# Patient Record
Sex: Female | Born: 1935 | Race: White | Hispanic: No | Marital: Married | Smoking: Never smoker
Health system: Southern US, Community
[De-identification: ages and names within clinical notes are randomized; demographics above are authoritative.]

## PROBLEM LIST (undated history)

## (undated) DIAGNOSIS — N6019 Diffuse cystic mastopathy of unspecified breast: Secondary | ICD-10-CM

## (undated) DIAGNOSIS — E079 Disorder of thyroid, unspecified: Secondary | ICD-10-CM

## (undated) DIAGNOSIS — T8859XA Other complications of anesthesia, initial encounter: Secondary | ICD-10-CM

## (undated) DIAGNOSIS — K219 Gastro-esophageal reflux disease without esophagitis: Secondary | ICD-10-CM

## (undated) DIAGNOSIS — T4145XA Adverse effect of unspecified anesthetic, initial encounter: Secondary | ICD-10-CM

## (undated) DIAGNOSIS — R112 Nausea with vomiting, unspecified: Secondary | ICD-10-CM

## (undated) DIAGNOSIS — Z9889 Other specified postprocedural states: Secondary | ICD-10-CM

## (undated) DIAGNOSIS — C50919 Malignant neoplasm of unspecified site of unspecified female breast: Secondary | ICD-10-CM

## (undated) DIAGNOSIS — Z972 Presence of dental prosthetic device (complete) (partial): Secondary | ICD-10-CM

## (undated) DIAGNOSIS — E785 Hyperlipidemia, unspecified: Secondary | ICD-10-CM

## (undated) DIAGNOSIS — L84 Corns and callosities: Secondary | ICD-10-CM

## (undated) DIAGNOSIS — Z974 Presence of external hearing-aid: Secondary | ICD-10-CM

## (undated) DIAGNOSIS — R42 Dizziness and giddiness: Secondary | ICD-10-CM

## (undated) DIAGNOSIS — M810 Age-related osteoporosis without current pathological fracture: Secondary | ICD-10-CM

## (undated) DIAGNOSIS — C801 Malignant (primary) neoplasm, unspecified: Secondary | ICD-10-CM

## (undated) DIAGNOSIS — T7840XA Allergy, unspecified, initial encounter: Secondary | ICD-10-CM

## (undated) DIAGNOSIS — Z923 Personal history of irradiation: Secondary | ICD-10-CM

## (undated) DIAGNOSIS — Z8489 Family history of other specified conditions: Secondary | ICD-10-CM

## (undated) HISTORY — PX: BREAST LUMPECTOMY: SHX2

## (undated) HISTORY — PX: APPENDECTOMY: SHX54

## (undated) HISTORY — PX: ABDOMINAL HYSTERECTOMY: SHX81

## (undated) HISTORY — PX: BREAST CYST EXCISION: SHX579

## (undated) HISTORY — DX: Corns and callosities: L84

## (undated) HISTORY — DX: Allergy, unspecified, initial encounter: T78.40XA

## (undated) HISTORY — DX: Gastro-esophageal reflux disease without esophagitis: K21.9

## (undated) HISTORY — PX: OTHER SURGICAL HISTORY: SHX169

## (undated) HISTORY — PX: CHOLECYSTECTOMY: SHX55

## (undated) HISTORY — DX: Disorder of thyroid, unspecified: E07.9

## (undated) HISTORY — DX: Hyperlipidemia, unspecified: E78.5

## (undated) HISTORY — DX: Age-related osteoporosis without current pathological fracture: M81.0

---

## 1978-02-05 HISTORY — PX: THYROID SURGERY: SHX805

## 2002-02-05 HISTORY — PX: BUNIONECTOMY: SHX129

## 2003-02-06 HISTORY — PX: KNEE SURGERY: SHX244

## 2004-07-06 ENCOUNTER — Ambulatory Visit: Payer: Self-pay

## 2005-03-26 ENCOUNTER — Ambulatory Visit: Payer: Self-pay | Admitting: Orthopedic Surgery

## 2005-04-24 ENCOUNTER — Other Ambulatory Visit: Payer: Self-pay

## 2005-04-24 ENCOUNTER — Ambulatory Visit: Payer: Self-pay | Admitting: Orthopedic Surgery

## 2005-04-30 ENCOUNTER — Ambulatory Visit: Payer: Self-pay | Admitting: Orthopedic Surgery

## 2005-09-04 ENCOUNTER — Ambulatory Visit: Payer: Self-pay | Admitting: Family Medicine

## 2005-09-11 ENCOUNTER — Ambulatory Visit: Payer: Self-pay

## 2005-09-21 ENCOUNTER — Ambulatory Visit: Payer: Self-pay

## 2005-10-09 ENCOUNTER — Ambulatory Visit: Payer: Self-pay | Admitting: Vascular Surgery

## 2005-10-19 ENCOUNTER — Inpatient Hospital Stay: Payer: Self-pay | Admitting: Vascular Surgery

## 2005-10-19 HISTORY — PX: CAROTID BODY TUMOR EXCISION: SHX5156

## 2006-12-04 ENCOUNTER — Ambulatory Visit: Payer: Self-pay | Admitting: Family Medicine

## 2007-11-12 LAB — HM PAP SMEAR: HM Pap smear: NORMAL

## 2007-12-16 ENCOUNTER — Ambulatory Visit: Payer: Self-pay | Admitting: Family Medicine

## 2008-12-22 ENCOUNTER — Ambulatory Visit: Payer: Self-pay

## 2009-04-05 ENCOUNTER — Ambulatory Visit: Payer: Self-pay | Admitting: Family Medicine

## 2010-02-28 ENCOUNTER — Ambulatory Visit: Payer: Self-pay | Admitting: Family Medicine

## 2010-06-27 ENCOUNTER — Emergency Department: Payer: Self-pay | Admitting: Unknown Physician Specialty

## 2011-02-06 DIAGNOSIS — C50911 Malignant neoplasm of unspecified site of right female breast: Secondary | ICD-10-CM

## 2011-02-06 HISTORY — PX: BREAST BIOPSY: SHX20

## 2011-02-06 HISTORY — PX: BREAST EXCISIONAL BIOPSY: SUR124

## 2011-02-06 HISTORY — DX: Malignant neoplasm of unspecified site of right female breast: C50.911

## 2011-03-07 ENCOUNTER — Ambulatory Visit: Payer: Self-pay | Admitting: Family Medicine

## 2011-03-08 ENCOUNTER — Ambulatory Visit: Payer: Self-pay | Admitting: Family Medicine

## 2011-03-22 ENCOUNTER — Ambulatory Visit: Payer: Self-pay | Admitting: Surgery

## 2011-03-30 ENCOUNTER — Ambulatory Visit: Payer: Self-pay | Admitting: Surgery

## 2011-03-30 LAB — CBC WITH DIFFERENTIAL/PLATELET
Basophil %: 0.5 %
Eosinophil %: 1.2 %
HCT: 37.8 % (ref 35.0–47.0)
HGB: 12.5 g/dL (ref 12.0–16.0)
Lymphocyte #: 2.1 10*3/uL (ref 1.0–3.6)
MCH: 30.7 pg (ref 26.0–34.0)
MCHC: 33.1 g/dL (ref 32.0–36.0)
MCV: 93 fL (ref 80–100)
Monocyte #: 0.5 10*3/uL (ref 0.0–0.7)
Monocyte %: 10.1 %
Neutrophil #: 2.3 10*3/uL (ref 1.4–6.5)
Neutrophil %: 45.8 %
RBC: 4.08 10*6/uL (ref 3.80–5.20)
WBC: 4.9 10*3/uL (ref 3.6–11.0)

## 2011-03-30 LAB — PATHOLOGY REPORT

## 2011-04-05 ENCOUNTER — Ambulatory Visit: Payer: Self-pay | Admitting: Surgery

## 2011-04-09 LAB — PATHOLOGY REPORT

## 2011-04-18 ENCOUNTER — Ambulatory Visit: Payer: Self-pay | Admitting: Internal Medicine

## 2011-05-07 ENCOUNTER — Ambulatory Visit: Payer: Self-pay | Admitting: Surgery

## 2011-05-07 ENCOUNTER — Ambulatory Visit: Payer: Self-pay | Admitting: Internal Medicine

## 2011-06-06 ENCOUNTER — Ambulatory Visit: Payer: Self-pay | Admitting: Internal Medicine

## 2011-07-07 ENCOUNTER — Ambulatory Visit: Payer: Self-pay | Admitting: Internal Medicine

## 2011-08-28 ENCOUNTER — Ambulatory Visit: Payer: Self-pay | Admitting: Internal Medicine

## 2011-08-28 LAB — CREATININE, SERUM: EGFR (Non-African Amer.): >60

## 2011-08-28 LAB — CBC CANCER CENTER
Basophil #: 0 x10 3/mm (ref 0.0–0.1)
Eosinophil #: 0.1 x10 3/mm (ref 0.0–0.7)
HCT: 38.9 % (ref 35.0–47.0)
HGB: 12.6 g/dL (ref 12.0–16.0)
Lymphocyte #: 2.1 x10 3/mm (ref 1.0–3.6)
Lymphocyte %: 35.1 %
MCH: 29.8 pg (ref 26.0–34.0)
MCHC: 32.3 g/dL (ref 32.0–36.0)
Monocyte #: 0.5 x10 3/mm (ref 0.2–0.9)
Monocyte %: 8.5 %
Neutrophil %: 54 %
Platelet: 213 x10 3/mm (ref 150–440)
RBC: 4.21 10*6/uL (ref 3.80–5.20)
WBC: 5.9 x10 3/mm (ref 3.6–11.0)

## 2011-08-28 LAB — HEPATIC FUNCTION PANEL A (ARMC)
Albumin: 4.2 g/dL (ref 3.4–5.0)
Bilirubin, Direct: 0.2 mg/dL (ref 0.00–0.20)
SGOT(AST): 20 U/L (ref 15–37)
SGPT (ALT): 24 U/L
Total Protein: 7.3 g/dL (ref 6.4–8.2)

## 2011-09-06 ENCOUNTER — Ambulatory Visit: Payer: Self-pay | Admitting: Internal Medicine

## 2011-10-11 ENCOUNTER — Ambulatory Visit: Payer: Self-pay | Admitting: Internal Medicine

## 2011-10-23 ENCOUNTER — Ambulatory Visit: Payer: Self-pay | Admitting: Internal Medicine

## 2011-10-26 LAB — CREATININE, SERUM
EGFR (African American): >60
EGFR (Non-African Amer.): 58 — ABNORMAL LOW

## 2011-10-26 LAB — CBC CANCER CENTER
Basophil %: 0.6 %
HGB: 12.9 g/dL (ref 12.0–16.0)
Lymphocyte #: 1.7 x10 3/mm (ref 1.0–3.6)
Lymphocyte %: 33.7 %
MCH: 30.6 pg (ref 26.0–34.0)
MCHC: 32.9 g/dL (ref 32.0–36.0)
MCV: 93 fL (ref 80–100)
Monocyte %: 9.3 %
Neutrophil #: 2.8 x10 3/mm (ref 1.4–6.5)
Neutrophil %: 55.4 %
RDW: 13.9 % (ref 11.5–14.5)

## 2011-10-26 LAB — HEPATIC FUNCTION PANEL A (ARMC)
Alkaline Phosphatase: 72 U/L (ref 50–136)
Bilirubin, Direct: 0.1 mg/dL (ref 0.00–0.20)
Bilirubin,Total: 0.5 mg/dL (ref 0.2–1.0)
SGOT(AST): 17 U/L (ref 15–37)
Total Protein: 7.6 g/dL (ref 6.4–8.2)

## 2011-11-06 ENCOUNTER — Ambulatory Visit: Payer: Self-pay | Admitting: Internal Medicine

## 2012-02-06 ENCOUNTER — Ambulatory Visit: Payer: Self-pay | Admitting: Internal Medicine

## 2012-03-04 LAB — CBC CANCER CENTER
Basophil %: 0.6 %
Eosinophil #: 0.1 x10 3/mm (ref 0.0–0.7)
Eosinophil %: 1.3 %
HCT: 38.5 % (ref 35.0–47.0)
HGB: 13 g/dL (ref 12.0–16.0)
Lymphocyte %: 43 %
MCH: 31.1 pg (ref 26.0–34.0)
MCHC: 33.9 g/dL (ref 32.0–36.0)
MCV: 92 fL (ref 80–100)
Monocyte %: 7.9 %
Neutrophil #: 2.7 x10 3/mm (ref 1.4–6.5)
Neutrophil %: 47.2 %
WBC: 5.7 x10 3/mm (ref 3.6–11.0)

## 2012-03-04 LAB — HEPATIC FUNCTION PANEL A (ARMC)
Albumin: 4.2 g/dL (ref 3.4–5.0)
Bilirubin,Total: 0.5 mg/dL (ref 0.2–1.0)
SGOT(AST): 22 U/L (ref 15–37)

## 2012-03-04 LAB — CREATININE, SERUM
EGFR (African American): >60
EGFR (Non-African Amer.): >60

## 2012-03-08 ENCOUNTER — Ambulatory Visit: Payer: Self-pay | Admitting: Internal Medicine

## 2012-03-31 ENCOUNTER — Ambulatory Visit: Payer: Self-pay | Admitting: Family Medicine

## 2012-03-31 LAB — HM MAMMOGRAPHY

## 2012-04-05 ENCOUNTER — Ambulatory Visit: Payer: Self-pay | Admitting: Internal Medicine

## 2012-05-06 ENCOUNTER — Ambulatory Visit: Payer: Self-pay | Admitting: Internal Medicine

## 2012-11-03 ENCOUNTER — Ambulatory Visit: Payer: Self-pay | Admitting: Internal Medicine

## 2012-11-03 LAB — CBC CANCER CENTER
Basophil #: 0 x10 3/mm (ref 0.0–0.1)
Basophil %: 0.5 %
Eosinophil #: 0.1 x10 3/mm (ref 0.0–0.7)
HGB: 13.9 g/dL (ref 12.0–16.0)
Lymphocyte #: 2.3 x10 3/mm (ref 1.0–3.6)
Lymphocyte %: 40.4 %
MCH: 30.9 pg (ref 26.0–34.0)
MCV: 92 fL (ref 80–100)
Neutrophil #: 2.7 x10 3/mm (ref 1.4–6.5)
Platelet: 226 x10 3/mm (ref 150–440)
RBC: 4.49 10*6/uL (ref 3.80–5.20)
WBC: 5.6 x10 3/mm (ref 3.6–11.0)

## 2012-11-03 LAB — CREATININE, SERUM: EGFR (African American): >60

## 2012-11-03 LAB — HEPATIC FUNCTION PANEL A (ARMC)
Albumin: 4.3 g/dL (ref 3.4–5.0)
SGPT (ALT): 33 U/L (ref 12–78)

## 2012-11-05 ENCOUNTER — Ambulatory Visit: Payer: Self-pay | Admitting: Internal Medicine

## 2013-04-15 ENCOUNTER — Ambulatory Visit: Payer: Self-pay | Admitting: Internal Medicine

## 2013-04-17 ENCOUNTER — Ambulatory Visit: Payer: Self-pay | Admitting: Internal Medicine

## 2013-05-06 ENCOUNTER — Ambulatory Visit: Payer: Self-pay | Admitting: Internal Medicine

## 2013-07-01 LAB — LIPID PANEL
Cholesterol: 204 mg/dL — AB (ref 0–200)
HDL: 59 mg/dL (ref 35–70)
LDL Cholesterol: 118 mg/dL
TRIGLYCERIDES: 137 mg/dL (ref 40–160)

## 2013-07-01 LAB — BASIC METABOLIC PANEL
BUN: 16 mg/dL (ref 4–21)
CREATININE: 0.8 mg/dL (ref 0.5–1.1)
GLUCOSE: 101 mg/dL
Potassium: 4.6 mmol/L (ref 3.4–5.3)
SODIUM: 144 mmol/L (ref 137–147)

## 2013-07-01 LAB — CBC AND DIFFERENTIAL
HCT: 39 % (ref 36–46)
HEMOGLOBIN: 13.1 g/dL (ref 12.0–16.0)
Platelets: 259 10*3/uL (ref 150–399)
WBC: 5.6 10^3/mL

## 2013-07-01 LAB — HEPATIC FUNCTION PANEL
ALT: 12 U/L (ref 7–35)
AST: 13 U/L (ref 13–35)

## 2013-07-01 LAB — TSH: TSH: 1.07 u[IU]/mL (ref 0.41–5.90)

## 2013-07-01 LAB — HEMOGLOBIN A1C: HEMOGLOBIN A1C: 6.2 % — AB (ref 4.0–6.0)

## 2013-07-13 LAB — HM DEXA SCAN

## 2013-09-23 ENCOUNTER — Ambulatory Visit: Payer: Self-pay | Admitting: Ophthalmology

## 2013-11-04 ENCOUNTER — Ambulatory Visit: Payer: Self-pay | Admitting: Internal Medicine

## 2013-11-04 LAB — CBC CANCER CENTER
Basophil #: 0 x10 3/mm (ref 0.0–0.1)
Basophil %: 0.6 %
EOS PCT: 1.8 %
Eosinophil #: 0.1 x10 3/mm (ref 0.0–0.7)
HCT: 40.1 % (ref 35.0–47.0)
HGB: 13 g/dL (ref 12.0–16.0)
LYMPHS PCT: 41.8 %
Lymphocyte #: 2.1 x10 3/mm (ref 1.0–3.6)
MCH: 30.1 pg (ref 26.0–34.0)
MCHC: 32.4 g/dL (ref 32.0–36.0)
MCV: 93 fL (ref 80–100)
MONO ABS: 0.4 x10 3/mm (ref 0.2–0.9)
MONOS PCT: 8.4 %
NEUTROS ABS: 2.4 x10 3/mm (ref 1.4–6.5)
NEUTROS PCT: 47.4 %
Platelet: 230 x10 3/mm (ref 150–440)
RBC: 4.31 10*6/uL (ref 3.80–5.20)
RDW: 13.4 % (ref 11.5–14.5)
WBC: 5.1 x10 3/mm (ref 3.6–11.0)

## 2013-11-04 LAB — HEPATIC FUNCTION PANEL A (ARMC)
Albumin: 3.9 g/dL (ref 3.4–5.0)
Alkaline Phosphatase: 77 U/L
Bilirubin, Direct: 0.1 mg/dL (ref 0.00–0.20)
Bilirubin,Total: 0.3 mg/dL (ref 0.2–1.0)
SGOT(AST): 9 U/L — ABNORMAL LOW (ref 15–37)
SGPT (ALT): 29 U/L
Total Protein: 6.9 g/dL (ref 6.4–8.2)

## 2013-11-04 LAB — CREATININE, SERUM
CREATININE: 0.85 mg/dL (ref 0.60–1.30)
EGFR (African American): >60
EGFR (Non-African Amer.): >60

## 2013-11-05 ENCOUNTER — Ambulatory Visit: Payer: Self-pay | Admitting: Internal Medicine

## 2014-03-05 DIAGNOSIS — B372 Candidiasis of skin and nail: Secondary | ICD-10-CM | POA: Diagnosis not present

## 2014-03-05 DIAGNOSIS — Z1283 Encounter for screening for malignant neoplasm of skin: Secondary | ICD-10-CM | POA: Diagnosis not present

## 2014-03-05 DIAGNOSIS — D229 Melanocytic nevi, unspecified: Secondary | ICD-10-CM | POA: Diagnosis not present

## 2014-03-05 DIAGNOSIS — L570 Actinic keratosis: Secondary | ICD-10-CM | POA: Diagnosis not present

## 2014-03-05 DIAGNOSIS — D0439 Carcinoma in situ of skin of other parts of face: Secondary | ICD-10-CM | POA: Diagnosis not present

## 2014-03-05 DIAGNOSIS — D485 Neoplasm of uncertain behavior of skin: Secondary | ICD-10-CM | POA: Diagnosis not present

## 2014-03-05 DIAGNOSIS — B078 Other viral warts: Secondary | ICD-10-CM | POA: Diagnosis not present

## 2014-03-05 DIAGNOSIS — L858 Other specified epidermal thickening: Secondary | ICD-10-CM | POA: Diagnosis not present

## 2014-03-17 DIAGNOSIS — D0439 Carcinoma in situ of skin of other parts of face: Secondary | ICD-10-CM | POA: Diagnosis not present

## 2014-03-17 DIAGNOSIS — C44329 Squamous cell carcinoma of skin of other parts of face: Secondary | ICD-10-CM | POA: Diagnosis not present

## 2014-03-17 HISTORY — PX: SKIN CANCER EXCISION: SHX779

## 2014-03-25 DIAGNOSIS — D229 Melanocytic nevi, unspecified: Secondary | ICD-10-CM | POA: Diagnosis not present

## 2014-03-25 DIAGNOSIS — L821 Other seborrheic keratosis: Secondary | ICD-10-CM | POA: Diagnosis not present

## 2014-03-25 DIAGNOSIS — L570 Actinic keratosis: Secondary | ICD-10-CM | POA: Diagnosis not present

## 2014-03-25 DIAGNOSIS — B078 Other viral warts: Secondary | ICD-10-CM | POA: Diagnosis not present

## 2014-04-20 ENCOUNTER — Ambulatory Visit
Admit: 2014-04-20 | Disposition: A | Discharge status: Home / Self Care | Payer: Self-pay | Attending: Radiation Oncology | Admitting: Radiation Oncology

## 2014-04-20 ENCOUNTER — Ambulatory Visit: Payer: Self-pay | Admitting: Internal Medicine

## 2014-04-20 DIAGNOSIS — Z9889 Other specified postprocedural states: Secondary | ICD-10-CM | POA: Diagnosis not present

## 2014-04-20 DIAGNOSIS — Z853 Personal history of malignant neoplasm of breast: Secondary | ICD-10-CM | POA: Diagnosis not present

## 2014-04-20 DIAGNOSIS — R928 Other abnormal and inconclusive findings on diagnostic imaging of breast: Secondary | ICD-10-CM | POA: Diagnosis not present

## 2014-04-21 DIAGNOSIS — C50911 Malignant neoplasm of unspecified site of right female breast: Secondary | ICD-10-CM | POA: Diagnosis not present

## 2014-04-26 DIAGNOSIS — Z853 Personal history of malignant neoplasm of breast: Secondary | ICD-10-CM | POA: Diagnosis not present

## 2014-05-04 DIAGNOSIS — H3531 Nonexudative age-related macular degeneration: Secondary | ICD-10-CM | POA: Diagnosis not present

## 2014-05-30 NOTE — Op Note (Signed)
PATIENT NAME:  Rebecca Patterson, Rebecca Patterson MR#:  [id number] DATE OF BIRTH:  13-May-1934  DATE OF PROCEDURE:  05/07/2011  PREOPERATIVE DIAGNOSIS: Breast cancer.   POSTOPERATIVE DIAGNOSIS: Breast cancer.   PROCEDURE PERFORMED: Insertion of MammoSite catheter in right breast.   SURGEON: Loreli Dollar, MD  ANESTHESIA: Local 1% Xylocaine with epinephrine AND general anesthesia.   INDICATIONS: This 80 year old female recently had a right partial mastectomy which is in the inferior aspect right breast done for breast cancer and she has been advised to have MammoSite treatment. She has had previous ultrasound in the office demonstrating presence of a seroma in the inferior aspect of the right breast.  DESCRIPTION OF PROCEDURE: The patient was placed on the operating table in the supine position under general anesthesia. The right breast was prepared with ChloraPrep, draped in a sterile manner.   The ultrasound transducer was inserted into a sterile bag. The inferior aspect right breast was studied with ultrasound demonstrating the site of the seroma which was approximately 3.5 cm in dimension. It appeared that it may contain a small amount of blood as well. Next, the skin in the inferomedial aspect of the right breast was injected with 1% Xylocaine with epinephrine. A transversely oriented 1 cm incision was made. Next, using ultrasound guidance the trocar was advanced into the seroma and the trocar was subsequently removed and the seroma cavity was aspirated removing serous fluid and also has some old blood. The tonsil sucker was put into the cavity aspirating additional amount of bloody fluid. Next, the MammoSite catheter was inserted. The balloon was inflated with 30 mL of saline and it felt somewhat taunt at that time. Next, the catheter was secured to the skin with a single 3-0 nylon suture. Some Betadine paint was applied. Next, gauze dressing was applied with Tegaderm to secure it.     The patient tolerated  the procedure satisfactorily and was then prepared for transfer to the recovery room.  ____________________________ Lenna Sciara. Rochel Brome, MD jws:cms D: 05/07/2011 08:21:53 ET T: 05/07/2011 08:36:35 ET JOB#: [id number]  cc: Loreli Dollar, MD, <Dictator> Loreli Dollar MD ELECTRONICALLY SIGNED 05/07/2011 18:26

## 2014-05-30 NOTE — Op Note (Signed)
PATIENT NAME:  Rebecca Patterson, Rebecca Patterson MR#:  [id number] DATE OF BIRTH:  March 03, 1934  DATE OF PROCEDURE:  04/05/2011  PREOPERATIVE DIAGNOSIS: Carcinoma of the right breast.   POSTOPERATIVE DIAGNOSIS: Carcinoma of the right breast.   PROCEDURE: Right partial mastectomy with axillary sentinel lymph node biopsy.   SURGEON: Loreli Dollar, MD  ANESTHESIA: General.   INDICATIONS: This 80 year old female recently had a mammogram depicting a 9.6 mm mass 7:00 position right breast. She had ultrasound-guided core biopsy which demonstrated invasive mammary carcinoma and surgery was recommended for definitive treatment.   Patient did have preoperative x-ray needle localization and preoperative injection of radioactive technetium sulfur colloid. Her scan did not demonstrate the location of the sentinel lymph node but the gamma counter was used during surgery for location. Her preoperative mammogram was reviewed seeing the Kopans wire and the biopsy site.  DESCRIPTION OF PROCEDURE: The patient was placed on the operating table in the supine position under general anesthesia. The right arm was placed on a lateral arm rest. The Kopans wire was cut 3 cm from the skin. The breast was prepared with ChloraPrep, draped in a sterile manner.  I noted the entrance point of the Kopans wire just above the inferior mammary fold at approximately 6:00 position right breast.   First the sentinel lymph node biopsy was done. The gamma counter was used to localize radioactivity in the axilla. An oblique incision was made extending from the border of the pectoralis major muscle posteriorly approximately 5 cm in length, carried down through subcutaneous tissues. Several small bleeding points were cauterized. Dissection was carried down through superficial fascia to the axillary fat pad. The gamma counter was used to identify a small lymph node which was slightly firm and about 5 mm in dimension which was resected with some surrounding  fatty tissue. The Ex-Vivo count was in the range of 25 to 35 counts per second. The background count was in the range of 0 to 6. The node was sent for frozen section. The wound was inspected. There was no palpable mass within the wound. Several small bleeding points were cauterized and there was no other identifiable focus of radioactivity. A moist sponge was placed in the wound.   Next, attention was turned to do the partial mastectomy. The wire was again noted. Transversely oriented curvilinear incision was made midway between the areola and the inferior mammary fold extending from approximately 5:00 position to 7:00 position. This was carried down through subcutaneous tissues and dissected down to encounter the wire. There was some firmness within the tissues surrounding the wire and a sample of tissue which was approximately 4 x 4 x 4 cm in dimension was resected. The orientation of this was labeled with the margin markers which were sutured to the medial, lateral, cranial and caudal, deep surface and skin surface and this was submitted for pathology to check for margins. The wound was inspected. Hemostasis was intact.   The pathologist called back two times during the operation; one to report the sentinel lymph node appeared to be free of cancer and then the second time called back to indicate that the margins appear clear. The closest margin was the medial margin at 5 mm.   The axillary wound was inspected. Hemostasis was intact. The wound was closed with running 5-0 Monocryl subcuticular suture. Next, the partial mastectomy wound was closed with a row of interrupted 4-0 chromic subcutaneous sutures and then the skin was closed with running 5-0 Monocryl subcuticular suture. Both  wounds were treated with Dermabond.   The patient tolerated the procedure satisfactorily and was prepared for transfer to the recovery room.   ____________________________ Lenna Sciara. Rochel Brome, MD jws:cms D: 04/05/2011 13:27:56  ET T: 04/05/2011 15:05:02 ET JOB#: [id number]  cc: Loreli Dollar, MD, <Dictator> Loreli Dollar MD ELECTRONICALLY SIGNED 04/08/2011 9:04

## 2014-05-30 NOTE — Consult Note (Signed)
Reason for Visit: This 80 year old Female patient presents to the clinic for initial evaluation of  Breast cancer .   Referred by Dr. Tamala Julian.  Diagnosis:   Chief Complaint/Diagnosis   80 year old female with a pathologic stage IA (T1 B. N0 M0) ER/PR positive HER-2/neu negative invasive mammary carcinoma of the right breast status post wide local excision and sentinel node biopsy   Pathology Report Pathology report reviewed    Imaging Report Mammograms reviewed    Referral Report Clinical notes reviewed    Planned Treatment Regimen Possible accelerated partial breast irradiation versus whole breast radiation    HPI   patient is a 80 year old female who presents with an abnormal mammogram of the right breast showing an small density inthe inferior aspect of the right breast. Patient underwent wide local excision for a 0.9 cm invasive mammary carcinoma. Tumor was overall nuclear grade 1. One sentinel lymph node was removed and was negative. No lymphovascular invasion was identified. Her margin was close but on reexcision was greater than 0.5 cm. Tumor was ER/PR positive and HER-2/neu negative. She has done well postoperatively and is without complaint. She seen today by both medical oncology and radiation oncology for consideration of adjuvant treatment. She is accompanied by multiple family members today.  Past Hx:    Cancer, Right Breast:    Hypothyroidism:    HOH/Bilateral hearing aids:    Heart Cath:    Excision of goiter:    Foot Surgery:    Right Knee Surgery:    Mastoid Surgery:    Carotid Artery Tumor:    Hysterectomy BSO:    Gall Bladder Surgery:   Past, Family and Social History:   Past Medical History positive    Cardiovascular Heart catheterization    Neurological/Psychiatric Hearing loss    Past Surgical History cholecystectomy; Mastoid surgery for carotid artery benign tumor excision of goiter, total of, hysterectomy right knee surgery and foot surgery     Family History positive    Family History Comments Strong family history of breast cancer as well as family history of adult onset diabetes. I    Social History noncontributory    Additional Past Medical and Surgical History Accompanied by multiple family members today   Allergies:   NKDA: None  Review of Systems:   General negative    Performance Status (ECOG) 0    Skin negative    Breast see HPI    Ophthalmologic negative    ENMT negative    Respiratory and Thorax negative    Cardiovascular negative    Gastrointestinal negative    Genitourinary negative    Musculoskeletal negative    Neurological negative    Psychiatric negative    Hematology/Lymphatics negative    Endocrine see HPI    Allergic/Immunologic negative   Nursing Notes:  Nursing Vital Signs and Chemo Nursing Nursing Notes: *CC Vital Signs Flowsheet:   13-Mar-13 14:38   Temp Temperature 99.5   Pulse Pulse 67   Respirations Respirations 20   SBP SBP 123   DBP DBP 77   Pain Scale (0-10)  1   Current Weight (kg) (kg) 66.8   Height (cm) centimeters 151.5   Physical Exam:  General/Skin/HEENT:   General normal    Skin normal    Eyes normal    ENMT normal    Head and Neck normal    Additional PE Well-developed elderly female in NAD. Right breast is a wide local excision site which is healing well some slight  ecchymosis surrounding that site. No dominant mass or nodularity is noted in either breast into position examined. No axillary or supraclavicular adenopathy is appreciated. Lungs are clear to A&P cardiac examination shows regular rate and rhythm.   Breasts/Resp/CV/GI/GU:   Respiratory and Thorax normal    Cardiovascular normal    Gastrointestinal normal    Genitourinary normal   MS/Neuro/Psych/Lymph:   Musculoskeletal normal    Neurological normal    Psychiatric normal    Lymphatics normal   Other Results:  Radiology Results: [place]:    31-Jan-13 15:30, US  Breast Right   US Breast Right    REASON FOR EXAM:    AV RT ASYMMETRIC NODULAR DENSITY  COMMENTS:       PROCEDURE: [place]  - US BREAST RIGHT  - Mar 08 2011  3:30PM     RESULT:     The region of interest within the right breast was evaluated. A   hypoechoic, irregularly bordered nodule isidentified measuring 9.6 x 6.4   x 7.3 mm. There are areas where this nodule is taller than wider. There   is internal vascularity within this nodule. Sonographic characteristics   of this nodule are concerning and surgical consultation is recommended.     IMPRESSION:  Suspicious nodule at the 7 o'clock position. Please refer to   the additional radiographic view dictation for completed discussion.   Thank you for this opportunity to contribute to the care of your patient.           Verified By: Mikki Santee, M.D., MD  [facility]:    24-Jan-12 14:01, Digital Screen Mammogram   Digital Screen Mammogram    REASON FOR EXAM:    SCR  COMMENTS:       PROCEDURE: MAM - MAM DGTL SCREENING MAMMO W/CAD  - Feb 28 2010  2:01PM     RESULT: No dominant masses or pathologic clustered calcifications   demonstrated. Diffuse nodularity and coarse calcifications is present.    IMPRESSION: Stable, benign exam. Routine yearly follow-up mammograms   suggested.     BI-RADS: Category 2 - Benign Findings      Thank you for this opportunity to contribute to the care of your patient.  A NEGATIVE MAMMOGRAM REPORT DOES NOT PRECLUDE BIOPSY OR OTHER EVALUATION   OF A CLINICALLY PALPABLE OR OTHERWISE SUSPICIOUS MASS OR LESION. BREAST   CANCER MAY NOT BE DETECTED BY MAMMOGRAPHY IN UP TO 10% OF CASES.            Verified By: Osa Craver, M.D., MD    31-Jan-13 14:33, Digital Additional Views Rt Breast (SCR)   Digital Additional Views Rt Breast (SCR)    REASON FOR EXAM:    AV RT ASYMMETRIC NODULAR DENSITY  COMMENTS:       PROCEDURE: MAM - MAM DIG ADDVIEWS RT SCR  - Mar 08 2011  2:33PM     RESULT:      The previously described area of nodular density within the right breast   was further evaluated with magnification compression imaging. This area   persists and demonstrates borders which are nodular. There is mild   spiculation associated with the nodule. Sonographic evaluation of this   nodule demonstrates a concerning sonographic appearance. Theradiographic   and sonographic findings are concerning and further evaluation with   surgical consultation recommended.      IMPRESSION:     BI-RADS: Category 4 - Suspicious Abnormality    Thank you for this opportunity to contribute  to the care of your patient.    A NEGATIVE MAMMOGRAM REPORT DOES NOT PRECLUDE BIOPSY OR OTHER EVALUATION   OF A CLINICALLY PALPABLE OR OTHERWISE SUSPICIOUS MASS OR LESION. BREAST   CANCER MAY NOT BE DETECTED BY MAMMOGRAPHY IN UP TO 10% OF CASES.           Verified By: Mikki Santee, M.D., MD   Assessment and Plan:  Impression:   pathologic stage IB invasive mammary carcinoma the right breast as was wide local excision and sentinel node biopsy in 80 year old female  Plan:   at this time patient is a candidate for accelerated partial breast irradiation using MammoSite catheter placement. She qualifies both for Astro as well as American brachytherapy Society criteria. She will be evaluated next week by Dr. Tamala Julian for possibility of catheter placement. Risks and benefits of treatment including small postage stamp area of moist desquamation over the site as well as fatigue and increased fibrosis in the lumpectomy site were all explained to the patient and her family and seem to comprehend her treatment plan well. If catheter could not be placed with adequate margins we will go ahead and do whole breast radiation and the patient is aware of that also. I discussed the case with Dr. Ma Hillock. Do not feel she will be a candidate for chemotherapy. She will be put on r case inhibitor after completion of radiation. We  have contacted Dr. Thompson Caul office for appointment for next week.  I would like to take this opportunity to thank you for allowing me to continue to participate in this patient's care.  CC Referral:   cc: Dr. Tamala Julian, Dr. Margarita Rana   Electronic Signatures: Baruch Gouty, Roda Shutters (MD)  (Signed 13-Mar-13 15:39)  Authored: HPI, Diagnosis, Past Hx, PFSH, Allergies, ROS, Nursing Notes, Physical Exam, Other Results, Encounter Assessment and Plan, CC Referring Physician   Last Updated: 13-Mar-13 15:39 by Armstead Peaks (MD)

## 2014-06-17 DIAGNOSIS — D447 Neoplasm of uncertain behavior of aortic body and other paraganglia: Secondary | ICD-10-CM | POA: Insufficient documentation

## 2014-06-17 DIAGNOSIS — E119 Type 2 diabetes mellitus without complications: Secondary | ICD-10-CM | POA: Insufficient documentation

## 2014-06-17 DIAGNOSIS — E039 Hypothyroidism, unspecified: Secondary | ICD-10-CM | POA: Insufficient documentation

## 2014-06-17 DIAGNOSIS — E1169 Type 2 diabetes mellitus with other specified complication: Secondary | ICD-10-CM | POA: Insufficient documentation

## 2014-06-17 DIAGNOSIS — E785 Hyperlipidemia, unspecified: Secondary | ICD-10-CM

## 2014-06-17 DIAGNOSIS — I459 Conduction disorder, unspecified: Secondary | ICD-10-CM | POA: Insufficient documentation

## 2014-06-17 DIAGNOSIS — M810 Age-related osteoporosis without current pathological fracture: Secondary | ICD-10-CM | POA: Insufficient documentation

## 2014-06-17 DIAGNOSIS — R42 Dizziness and giddiness: Secondary | ICD-10-CM | POA: Insufficient documentation

## 2014-06-17 DIAGNOSIS — I8390 Asymptomatic varicose veins of unspecified lower extremity: Secondary | ICD-10-CM | POA: Insufficient documentation

## 2014-06-17 DIAGNOSIS — E559 Vitamin D deficiency, unspecified: Secondary | ICD-10-CM | POA: Insufficient documentation

## 2014-08-17 ENCOUNTER — Encounter: Payer: Self-pay | Admitting: Family Medicine

## 2014-08-17 ENCOUNTER — Ambulatory Visit (INDEPENDENT_AMBULATORY_CARE_PROVIDER_SITE_OTHER): Payer: Medicare Other | Admitting: Family Medicine

## 2014-08-17 VITALS — BP 130/80 | HR 64 | Temp 97.6°F | Resp 16 | Ht 60.0 in | Wt 143.0 lb

## 2014-08-17 DIAGNOSIS — Z1211 Encounter for screening for malignant neoplasm of colon: Secondary | ICD-10-CM

## 2014-08-17 DIAGNOSIS — E039 Hypothyroidism, unspecified: Secondary | ICD-10-CM

## 2014-08-17 DIAGNOSIS — Z0000 Encounter for general adult medical examination without abnormal findings: Secondary | ICD-10-CM | POA: Diagnosis not present

## 2014-08-17 DIAGNOSIS — E119 Type 2 diabetes mellitus without complications: Secondary | ICD-10-CM | POA: Diagnosis not present

## 2014-08-17 DIAGNOSIS — M810 Age-related osteoporosis without current pathological fracture: Secondary | ICD-10-CM | POA: Diagnosis not present

## 2014-08-17 DIAGNOSIS — R002 Palpitations: Secondary | ICD-10-CM | POA: Insufficient documentation

## 2014-08-17 LAB — IFOBT (OCCULT BLOOD): IFOBT: NEGATIVE

## 2014-08-17 NOTE — Progress Notes (Signed)
Patient ID: Rebecca Patterson, female   DOB: 1934/09/25, 80 y.o.   MRN: [id number]       Patient: Rebecca Patterson, Female    DOB: December 11, 1934, 80 y.o.   MRN: [id number] Visit Date: 08/17/2014  Today's Provider: Margarita Rana, MD   Chief Complaint  Patient presents with  . Medicare Wellness   Subjective:    Annual wellness visit Rebecca Patterson is a 80 y.o. female who presents today for her Subsequent Annual Wellness Visit. She feels well. She reports exercising daily. She reports she is sleeping fairly well. 5/27/015 CPE 11/12/07 Pap neg; HPV neg 04/20/14 Mammo 07/13/13  BMD-Osteoporosis 01/23/12 EKG  Lab Results  Component Value Date   WBC 5.1 11/04/2013   HGB 13.0 11/04/2013   HCT 40.1 11/04/2013   PLT 230 11/04/2013   CHOL 204* 07/01/2013   TRIG 137 07/01/2013   HDL 59 07/01/2013   LDLCALC 118 07/01/2013   ALT 29 11/04/2013   AST 9* 11/04/2013   NA 144 07/01/2013   K 4.6 07/01/2013   CREATININE 0.85 11/04/2013   BUN 16 07/01/2013   TSH 1.07 07/01/2013   HGBA1C 6.2* 07/01/2013    -----------------------------------------------------------   Review of Systems  Constitutional: Negative.   HENT: Positive for hearing loss.   Eyes: Negative.   Respiratory: Negative.   Cardiovascular: Negative.   Gastrointestinal: Negative.   Endocrine: Negative.   Genitourinary: Negative.   Musculoskeletal: Negative.   Skin: Negative.   Allergic/Immunologic: Negative.   Neurological: Negative.   Hematological: Negative.   Psychiatric/Behavioral: Negative.     History   Social History  . Marital Status: Married    Spouse Name: Rebecca Patterson  . Number of Children: 2  . Years of Education: N/A   Occupational History  . Not on file.   Social History Main Topics  . Smoking status: Never Smoker   . Smokeless tobacco: Never Used  . Alcohol Use: No  . Drug Use: No  . Sexual Activity: Not on file   Other Topics Concern  . Not on file   Social History Narrative    Patient  Active Problem List   Diagnosis Date Noted  . Diabetes mellitus, type 2 08/17/2014  . Awareness of heartbeats 08/17/2014  . 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 Cardiac conduction disorder 06/17/2014  . Diabetes 06/17/2014  . HLD (hyperlipidemia) 06/17/2014  . Adult hypothyroidism 06/17/2014  . Drug-induced osteoporosis 06/17/2014  . 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 Head revolving around 06/17/2014  . Avitaminosis D 06/17/2014    Past Surgical History  Procedure Laterality Date  . Abdominal hysterectomy    . Cholecystectomy    . Carotid body tumor excision Right 10/19/2005  . Knee surgery Right 2005  . Bunionectomy Bilateral 2004  . Thyroid surgery N/A 1980    Goiter  . Mastoid surgery Right   . Skin cancer excision  03/17/2014    right side of face Dr. Aubery Patterson    Her family history includes Cancer in her sister; Stroke in her sister.    Previous Medications   CALCIUM CARBONATE (OS-CAL) 600 MG TABS TABLET    Take by mouth.   EXEMESTANE (AROMASIN) 25 MG TABLET    Take by mouth.   FAMOTIDINE (PEPCID) 20 MG TABLET    Take by mouth.   LEVOTHYROXINE (SYNTHROID, LEVOTHROID) 50 MCG TABLET    Take by mouth.   OMEGA-3 FATTY ACIDS (FISH OIL DOUBLE STRENGTH) 1200 MG CAPS    Take by mouth.    Patient Care Team:  Rebecca Rana, MD as PCP - General (Family Medicine)     Objective:   Vitals: BP 130/80 mmHg  Pulse 64  Temp(Src) 97.6 F (36.4 C) (Oral)  Resp 16  Ht 5' (1.524 m)  Wt 143 lb (64.864 kg)  BMI 27.93 kg/m2  SpO2 97%  Physical Exam  Constitutional: She is oriented to person, place, and time. She appears well-developed and well-nourished.  HENT:  Head: Normocephalic and atraumatic.  Right Ear: Tympanic membrane, external ear and ear canal normal.  Left Ear: Tympanic membrane, external ear and ear canal normal.  Nose: Nose normal.  Mouth/Throat: Uvula is midline, oropharynx is clear and moist and mucous membranes are normal.  Eyes: Conjunctivae, EOM and lids are normal.  Pupils are equal, round, and reactive to light.  Neck: Trachea normal and normal range of motion. Neck supple. Carotid bruit is not present. No thyroid mass and no thyromegaly present.  Cardiovascular: Normal rate, regular rhythm and normal heart sounds.   Pulmonary/Chest: Effort normal and breath sounds normal.  Abdominal: Soft. Normal appearance and bowel sounds are normal. There is no hepatosplenomegaly. There is no tenderness.  Genitourinary: Rectum normal. No breast swelling, tenderness or discharge.  Musculoskeletal: Normal range of motion.  Lymphadenopathy:    She has no cervical adenopathy.    She has no axillary adenopathy.  Neurological: She is alert and oriented to person, place, and time. She has normal strength. No cranial nerve deficit.  Skin: Skin is warm, dry and intact.  Psychiatric: She has a normal mood and affect. Her speech is normal and behavior is normal. Judgment and thought content normal. Cognition and memory are normal.    Activities of Daily Living In your present state of health, do you have any difficulty performing the following activities: 08/17/2014  Hearing? Y  Vision? N  Difficulty concentrating or making decisions? N  Walking or climbing stairs? N  Dressing or bathing? N  Doing errands, shopping? N    Fall Risk Assessment Fall Risk  08/17/2014  Falls in the past year? No     Depression Screen PHQ 2/9 Scores 08/17/2014  PHQ - 2 Score 0    Cognitive Testing - 6-CIT  Correct? Score   What year is it? yes 0 0 or 4  What month is it? yes 0 0 or 3  Memorize:    Rebecca Patterson,  44,  [address]  [place],      What time is it? (within 1 hour) yes 0 0 or 3  Count backwards from 20 yes 0 0, 2, or 4  Name the months of the year yes 2 0, 2, or 4  Repeat name & address above yes 3 0, 2, 4, 6, 8, or 10       TOTAL SCORE  5/28   Interpretation:  Normal  Normal (0-7) Abnormal (8-28)       Assessment & Plan:     Annual Wellness Visit  Reviewed  patient's Family Medical History Reviewed and updated list of patient's medical providers Assessment of cognitive impairment was done Assessed patient's functional ability Established a written schedule for health screening Rebecca Patterson Completed and Reviewed  Exercise Activities and Dietary recommendations Goals    . Exercise 150 minutes per week (moderate activity)       Immunization History  Administered Date(s) Administered  . Influenza-Unspecified 10/06/2013  . Pneumococcal Conjugate-13 10/21/2013  . Pneumococcal Polysaccharide-23 01/20/2010  . Td 09/03/2005    Health Maintenance  Topic  Date Due  . FOOT EXAM  01/02/1947  . OPHTHALMOLOGY EXAM  01/02/1947  . URINE MICROALBUMIN  01/02/1947  . COLONOSCOPY  01/02/1987  . ZOSTAVAX  01/01/1997  . HEMOGLOBIN A1C  01/01/2014  . INFLUENZA VACCINE  09/06/2014  . TETANUS/TDAP  09/04/2015  . DEXA SCAN  Completed  . PNA vac Low Risk Adult  Completed      1. Medicare annual wellness visit, subsequent Stable. Patient advised to continue eating healthy and exercise daily.  2. Colon cancer screening - IFOBT POC (occult bld, rslt in office)  3. Type 2 diabetes mellitus without complication F/U pending lab report. - Hemoglobin A1c  4. Hypothyroidism, unspecified hypothyroidism type F/U pending lab report. - TSH  5. Osteoporosis Patient reports she failed Fosamax, and Aromasin prescribed by Dr. Lindon Romp.    Patient seen and examined by Dr. Jerrell Belfast, and note scribed by Philbert Riser. Dimas, CMA. I have reviewed the document for accuracy and completeness and I agree with above. Jerrell Belfast, MD    Rebecca Rana, MD           ------------------------------------------------------------------------------------------------------------

## 2014-08-18 LAB — HEMOGLOBIN A1C
ESTIMATED AVERAGE GLUCOSE: 137 mg/dL
Hgb A1c MFr Bld: 6.4 % — ABNORMAL HIGH (ref 4.8–5.6)

## 2014-08-18 LAB — TSH: TSH: 1.48 u[IU]/mL (ref 0.450–4.500)

## 2014-08-19 ENCOUNTER — Telehealth: Payer: Self-pay

## 2014-08-19 NOTE — Telephone Encounter (Signed)
LMTCB Roxanne Panek Drozdowski, CMA  

## 2014-08-19 NOTE — Telephone Encounter (Signed)
-----   Message from Margarita Rana, MD sent at 08/18/2014  5:43 PM EDT ----- Labs stable. Blood sugar slightly higher than previous. Make sure to eat healthy, exercise and  Recheck blood sugar (A1c) in 3 to 6 months for stability. Thanks.

## 2014-08-19 NOTE — Telephone Encounter (Signed)
Advised pt of lab results. Pt verbally acknowledges understanding. Pt will call back to schedule appointment. Renaldo Fiddler, CMA

## 2014-11-04 ENCOUNTER — Encounter: Payer: Self-pay | Admitting: *Deleted

## 2014-11-05 ENCOUNTER — Inpatient Hospital Stay (HOSPITAL_BASED_OUTPATIENT_CLINIC_OR_DEPARTMENT_OTHER): Payer: Medicare Other | Admitting: Internal Medicine

## 2014-11-05 ENCOUNTER — Inpatient Hospital Stay: Payer: Medicare Other | Attending: Internal Medicine

## 2014-11-05 VITALS — BP 110/78 | HR 67 | Temp 98.3°F | Resp 18 | Ht 60.0 in | Wt 145.3 lb

## 2014-11-05 DIAGNOSIS — K219 Gastro-esophageal reflux disease without esophagitis: Secondary | ICD-10-CM | POA: Insufficient documentation

## 2014-11-05 DIAGNOSIS — Z79899 Other long term (current) drug therapy: Secondary | ICD-10-CM | POA: Diagnosis not present

## 2014-11-05 DIAGNOSIS — Z170 Estrogen receptor positive status [ER+]: Secondary | ICD-10-CM | POA: Insufficient documentation

## 2014-11-05 DIAGNOSIS — C50911 Malignant neoplasm of unspecified site of right female breast: Secondary | ICD-10-CM

## 2014-11-05 DIAGNOSIS — Z79811 Long term (current) use of aromatase inhibitors: Secondary | ICD-10-CM

## 2014-11-05 DIAGNOSIS — M8580 Other specified disorders of bone density and structure, unspecified site: Secondary | ICD-10-CM | POA: Insufficient documentation

## 2014-11-05 DIAGNOSIS — E079 Disorder of thyroid, unspecified: Secondary | ICD-10-CM | POA: Insufficient documentation

## 2014-11-05 DIAGNOSIS — E785 Hyperlipidemia, unspecified: Secondary | ICD-10-CM | POA: Diagnosis not present

## 2014-11-05 DIAGNOSIS — E119 Type 2 diabetes mellitus without complications: Secondary | ICD-10-CM | POA: Insufficient documentation

## 2014-11-05 DIAGNOSIS — R42 Dizziness and giddiness: Secondary | ICD-10-CM | POA: Diagnosis not present

## 2014-11-05 LAB — CBC WITH DIFFERENTIAL/PLATELET
BASOS PCT: 1 %
Basophils Absolute: 0 10*3/uL (ref 0–0.1)
EOS ABS: 0.1 10*3/uL (ref 0–0.7)
Eosinophils Relative: 1 %
HCT: 39.8 % (ref 35.0–47.0)
HEMOGLOBIN: 13.6 g/dL (ref 12.0–16.0)
Lymphocytes Relative: 37 %
Lymphs Abs: 2 10*3/uL (ref 1.0–3.6)
MCH: 30.8 pg (ref 26.0–34.0)
MCHC: 34.1 g/dL (ref 32.0–36.0)
MCV: 90.3 fL (ref 80.0–100.0)
MONO ABS: 0.5 10*3/uL (ref 0.2–0.9)
Monocytes Relative: 9 %
NEUTROS PCT: 52 %
Neutro Abs: 2.8 10*3/uL (ref 1.4–6.5)
Platelets: 214 10*3/uL (ref 150–440)
RBC: 4.41 MIL/uL (ref 3.80–5.20)
RDW: 13.4 % (ref 11.5–14.5)
WBC: 5.4 10*3/uL (ref 3.6–11.0)

## 2014-11-05 LAB — CREATININE, SERUM: Creatinine, Ser: 0.79 mg/dL (ref 0.44–1.00)

## 2014-11-05 LAB — HEPATIC FUNCTION PANEL
ALBUMIN: 4.3 g/dL (ref 3.5–5.0)
ALT: 13 U/L — AB (ref 14–54)
AST: 17 U/L (ref 15–41)
Alkaline Phosphatase: 64 U/L (ref 38–126)
BILIRUBIN TOTAL: 0.7 mg/dL (ref 0.3–1.2)
Total Protein: 6.9 g/dL (ref 6.5–8.1)

## 2014-11-05 NOTE — Progress Notes (Signed)
Patient is here for follow-up of breast cancer. Patient states that overall she has been doing well. She is scheduled for cataract surgery next Wednesday.

## 2014-11-09 NOTE — Discharge Instructions (Signed)

## 2014-11-10 ENCOUNTER — Ambulatory Visit: Payer: Medicare Other | Admitting: Anesthesiology

## 2014-11-10 ENCOUNTER — Encounter
Admission: RE | Disposition: A | Discharge status: Home / Self Care | Payer: Self-pay | Source: Ambulatory Visit | Attending: Ophthalmology

## 2014-11-10 ENCOUNTER — Ambulatory Visit
Admission: RE | Admit: 2014-11-10 | Discharge: 2014-11-10 | Disposition: A | Discharge status: Home / Self Care | Payer: Medicare Other | Source: Ambulatory Visit | Attending: Ophthalmology | Admitting: Ophthalmology

## 2014-11-10 DIAGNOSIS — Z9889 Other specified postprocedural states: Secondary | ICD-10-CM | POA: Diagnosis not present

## 2014-11-10 DIAGNOSIS — H9193 Unspecified hearing loss, bilateral: Secondary | ICD-10-CM | POA: Insufficient documentation

## 2014-11-10 DIAGNOSIS — H2511 Age-related nuclear cataract, right eye: Secondary | ICD-10-CM | POA: Diagnosis not present

## 2014-11-10 DIAGNOSIS — Z9842 Cataract extraction status, left eye: Secondary | ICD-10-CM | POA: Insufficient documentation

## 2014-11-10 DIAGNOSIS — H269 Unspecified cataract: Secondary | ICD-10-CM | POA: Diagnosis present

## 2014-11-10 DIAGNOSIS — Z853 Personal history of malignant neoplasm of breast: Secondary | ICD-10-CM | POA: Insufficient documentation

## 2014-11-10 DIAGNOSIS — M810 Age-related osteoporosis without current pathological fracture: Secondary | ICD-10-CM | POA: Insufficient documentation

## 2014-11-10 DIAGNOSIS — Z79899 Other long term (current) drug therapy: Secondary | ICD-10-CM | POA: Insufficient documentation

## 2014-11-10 DIAGNOSIS — Z9861 Coronary angioplasty status: Secondary | ICD-10-CM | POA: Insufficient documentation

## 2014-11-10 DIAGNOSIS — E079 Disorder of thyroid, unspecified: Secondary | ICD-10-CM | POA: Insufficient documentation

## 2014-11-10 DIAGNOSIS — Z9049 Acquired absence of other specified parts of digestive tract: Secondary | ICD-10-CM | POA: Diagnosis not present

## 2014-11-10 HISTORY — DX: Presence of dental prosthetic device (complete) (partial): Z97.2

## 2014-11-10 HISTORY — DX: Family history of other specified conditions: Z84.89

## 2014-11-10 HISTORY — DX: Other specified postprocedural states: Z98.890

## 2014-11-10 HISTORY — DX: Presence of external hearing-aid: Z97.4

## 2014-11-10 HISTORY — DX: Dizziness and giddiness: R42

## 2014-11-10 HISTORY — DX: Adverse effect of unspecified anesthetic, initial encounter: T41.45XA

## 2014-11-10 HISTORY — DX: Other complications of anesthesia, initial encounter: T88.59XA

## 2014-11-10 HISTORY — DX: Nausea with vomiting, unspecified: R11.2

## 2014-11-10 HISTORY — PX: CATARACT EXTRACTION W/PHACO: SHX586

## 2014-11-10 SURGERY — PHACOEMULSIFICATION, CATARACT, WITH IOL INSERTION
Anesthesia: Monitor Anesthesia Care | Laterality: Right | Wound class: Clean

## 2014-11-10 MED ORDER — MIDAZOLAM HCL 2 MG/2ML IJ SOLN
INTRAMUSCULAR | Status: DC | PRN
  Administered 2014-11-10: 1 mg via INTRAVENOUS

## 2014-11-10 MED ORDER — ACETAMINOPHEN 325 MG PO TABS
325.0000 mg | ORAL_TABLET | ORAL | Status: DC | PRN
Start: 2014-11-10 — End: 2014-11-10
  Administered 2014-11-10: 650 mg via ORAL

## 2014-11-10 MED ORDER — TETRACAINE HCL 0.5 % OP SOLN
1.0000 [drp] | OPHTHALMIC | Status: DC | PRN
  Administered 2014-11-10: 1 [drp] via OPHTHALMIC

## 2014-11-10 MED ORDER — EPINEPHRINE HCL 1 MG/ML IJ SOLN
INTRAOCULAR | Status: DC | PRN
  Administered 2014-11-10: 80 mL via OPHTHALMIC

## 2014-11-10 MED ORDER — FENTANYL CITRATE (PF) 100 MCG/2ML IJ SOLN
INTRAMUSCULAR | Status: DC | PRN
  Administered 2014-11-10: 50 ug via INTRAVENOUS

## 2014-11-10 MED ORDER — CEFUROXIME OPHTHALMIC INJECTION 1 MG/0.1 ML
INJECTION | OPHTHALMIC | Status: DC | PRN
  Administered 2014-11-10: 0.1 mL via INTRACAMERAL

## 2014-11-10 MED ORDER — TIMOLOL MALEATE 0.5 % OP SOLN
OPHTHALMIC | Status: DC | PRN
Start: 2014-11-10 — End: 2014-11-10
  Administered 2014-11-10: 1 [drp] via OPHTHALMIC

## 2014-11-10 MED ORDER — POVIDONE-IODINE 5 % OP SOLN
1.0000 "application " | OPHTHALMIC | Status: DC | PRN
  Administered 2014-11-10: 1 via OPHTHALMIC

## 2014-11-10 MED ORDER — ONDANSETRON HCL 4 MG/2ML IJ SOLN
INTRAMUSCULAR | Status: DC | PRN
  Administered 2014-11-10: 4 mg via INTRAVENOUS

## 2014-11-10 MED ORDER — NA HYALUR & NA CHOND-NA HYALUR 0.4-0.35 ML IO KIT
PACK | INTRAOCULAR | Status: DC | PRN
  Administered 2014-11-10: 1 mL via INTRAOCULAR

## 2014-11-10 MED ORDER — BRIMONIDINE TARTRATE 0.2 % OP SOLN
OPHTHALMIC | Status: DC | PRN
  Administered 2014-11-10: 1 [drp] via OPHTHALMIC

## 2014-11-10 MED ORDER — ACETAMINOPHEN 160 MG/5ML PO SOLN: 325.0000 mg | ORAL | Status: DC | PRN

## 2014-11-10 MED ORDER — ARMC OPHTHALMIC DILATING GEL
1.0000 "application " | OPHTHALMIC | Status: DC | PRN
  Administered 2014-11-10 (×2): 1 via OPHTHALMIC

## 2014-11-10 MED ORDER — LACTATED RINGERS IV SOLN: INTRAVENOUS | Status: DC

## 2014-11-10 SURGICAL SUPPLY — 26 items
CANNULA ANT/CHMB 27GA (MISCELLANEOUS) ×3 IMPLANT
GLOVE SURG LX 7.5 STRW (GLOVE) ×2
GLOVE SURG LX STRL 7.5 STRW (GLOVE) ×1 IMPLANT
GLOVE SURG TRIUMPH 8.0 PF LTX (GLOVE) ×3 IMPLANT
GOWN STRL REUS W/ TWL LRG LVL3 (GOWN DISPOSABLE) ×2 IMPLANT
GOWN STRL REUS W/TWL LRG LVL3 (GOWN DISPOSABLE) ×4
LENS IOL TECNIS 25.0 (Intraocular Lens) ×3 IMPLANT
LENS IOL TECNIS MONO 1P 25.0 (Intraocular Lens) ×1 IMPLANT
MARKER SKIN SURG W/RULER VIO (MISCELLANEOUS) ×3 IMPLANT
NDL RETROBULBAR .5 NSTRL (NEEDLE) IMPLANT
NEEDLE FILTER BLUNT 18X 1/2SAF (NEEDLE) ×4
NEEDLE FILTER BLUNT 18X1 1/2 (NEEDLE) ×2 IMPLANT
PACK CATARACT BRASINGTON (MISCELLANEOUS) ×3 IMPLANT
PACK EYE AFTER SURG (MISCELLANEOUS) ×3 IMPLANT
PACK OPTHALMIC (MISCELLANEOUS) ×3 IMPLANT
RING MALYGIN 7.0 (MISCELLANEOUS) IMPLANT
SUT ETHILON 10-0 CS-B-6CS-B-6 (SUTURE)
SUT VICRYL  9 0 (SUTURE)
SUT VICRYL 9 0 (SUTURE) IMPLANT
SUTURE EHLN 10-0 CS-B-6CS-B-6 (SUTURE) IMPLANT
SYR 3ML LL SCALE MARK (SYRINGE) ×6 IMPLANT
SYR 5ML LL (SYRINGE) IMPLANT
SYR TB 1ML LUER SLIP (SYRINGE) ×3 IMPLANT
WATER STERILE IRR 250ML POUR (IV SOLUTION) ×3 IMPLANT
WATER STERILE IRR 500ML POUR (IV SOLUTION) IMPLANT
WIPE NON LINTING 3.25X3.25 (MISCELLANEOUS) ×3 IMPLANT

## 2014-11-10 NOTE — Anesthesia Preprocedure Evaluation (Signed)
Anesthesia Evaluation  Patient identified by MRN, date of birth, ID band  Reviewed: Allergy & Precautions, H&P , NPO status , Patient's Chart, lab work & pertinent test results  History of Anesthesia Complications (+) PONV and history of anesthetic complications  Airway Mallampati: II  TM Distance: >3 FB Neck ROM: full    Dental  (+) Upper Dentures, Lower Dentures   Pulmonary    Pulmonary exam normal        Cardiovascular  Rhythm:regular Rate:Normal     Neuro/Psych    GI/Hepatic GERD  ,  Endo/Other  diabetesHypothyroidism   Renal/GU      Musculoskeletal   Abdominal   Peds  Hematology   Anesthesia Other Findings   Reproductive/Obstetrics                             Anesthesia Physical Anesthesia Plan  ASA: II  Anesthesia Plan: MAC   Post-op Pain Management:    Induction:   Airway Management Planned:   Additional Equipment:   Intra-op Plan:   Post-operative Plan:   Informed Consent: I have reviewed the patients History and Physical, chart, labs and discussed the procedure including the risks, benefits and alternatives for the proposed anesthesia with the patient or authorized representative who has indicated his/her understanding and acceptance.     Plan Discussed with: CRNA  Anesthesia Plan Comments:         Anesthesia Quick Evaluation

## 2014-11-10 NOTE — Anesthesia Postprocedure Evaluation (Signed)
  Anesthesia Post-op Note  Patient: Rebecca Patterson  Procedure(s) Performed: Procedure(s): CATARACT EXTRACTION PHACO AND INTRAOCULAR LENS PLACEMENT (IOC) (Right)  Anesthesia type:MAC  Patient location: PACU  Post pain: Pain level controlled  Post assessment: Post-op Vital signs reviewed, Patient's Cardiovascular Status Stable, Respiratory Function Stable, Patent Airway and No signs of Nausea or vomiting  Post vital signs: Reviewed and stable  Last Vitals:  Filed Vitals:   11/10/14 1228  BP: 113/70  Pulse: 60  Temp:   Resp: 14    Level of consciousness: awake, alert  and patient cooperative  Complications: No apparent anesthesia complications

## 2014-11-10 NOTE — Op Note (Signed)
LOCATION:  [facility]   PREOPERATIVE DIAGNOSIS:    Nuclear sclerotic cataract right eye. H25.11   POSTOPERATIVE DIAGNOSIS:  Nuclear sclerotic cataract right eye.     PROCEDURE:  Phacoemusification with posterior chamber intraocular lens placement of the right eye   LENS:   Implant Name Type Inv. Item Serial No. Manufacturer Lot No. LRB No. Used  LENS IMPL INTRAOC [id number] 25.0 - [id number] Intraocular Lens LENS IMPL INTRAOC ZCB00 25.0 [id number] AMO   Right 1        ULTRASOUND TIME: 13 % of 1 minutes, 16 seconds.  CDE 9.6   SURGEON:  Wyonia Hough, MD   ANESTHESIA:  Topical with tetracaine drops and 2% Xylocaine jelly.   COMPLICATIONS:  None.   DESCRIPTION OF PROCEDURE:  The patient was identified in the holding room and transported to the operating room and placed in the supine position under the operating microscope.  The right eye was identified as the operative eye and it was prepped and draped in the usual sterile ophthalmic fashion.   A 1 millimeter clear-corneal paracentesis was made at the 12:00 position.  The anterior chamber was filled with Viscoat viscoelastic.  A 2.4 millimeter keratome was used to make a near-clear corneal incision at the 9:00 position.  A curvilinear capsulorrhexis was made with a cystotome and capsulorrhexis forceps.  Balanced salt solution was used to hydrodissect and hydrodelineate the nucleus.   Phacoemulsification was then used in stop and chop fashion to remove the lens nucleus and epinucleus.  The remaining cortex was then removed using the irrigation and aspiration handpiece. Provisc was then placed into the capsular bag to distend it for lens placement.  A lens was then injected into the capsular bag.  The remaining viscoelastic was aspirated.   Wounds were hydrated with balanced salt solution.  The anterior chamber was inflated to a physiologic pressure with balanced salt solution.  No wound leaks were noted. Cefuroxime 0.1 ml of a  10mg /ml solution was injected into the anterior chamber for a dose of 1 mg of intracameral antibiotic at the completion of the case.   Timolol and Brimonidine drops were applied to the eye.  The patient was taken to the recovery room in stable condition without complications of anesthesia or surgery.   Shyrl Obi 11/10/2014, 12:13 PM

## 2014-11-10 NOTE — H&P (Signed)
  The History and Physical notes are on paper, have been signed, and are to be scanned. The patient remains stable and unchanged from the H&P.   Previous H&P reviewed, patient examined, and there are no changes.  Rebecca Patterson 11/10/2014 10:47 AM

## 2014-11-10 NOTE — Anesthesia Procedure Notes (Signed)
Procedure Name: MAC Performed by: Lenay Lovejoy Pre-anesthesia Checklist: Patient identified, Emergency Drugs available, Suction available, Patient being monitored and Timeout performed Patient Re-evaluated:Patient Re-evaluated prior to inductionOxygen Delivery Method: Nasal cannula Intubation Type: IV induction     

## 2014-11-10 NOTE — Transfer of Care (Signed)
Immediate Anesthesia Transfer of Care Note  Patient: Rebecca Patterson  Procedure(s) Performed: Procedure(s): CATARACT EXTRACTION PHACO AND INTRAOCULAR LENS PLACEMENT (IOC) (Right)  Patient Location: PACU  Anesthesia Type: MAC  Level of Consciousness: awake, alert  and patient cooperative  Airway and Oxygen Therapy: Patient Spontanous Breathing and Patient connected to supplemental oxygen  Post-op Assessment: Post-op Vital signs reviewed, Patient's Cardiovascular Status Stable, Respiratory Function Stable, Patent Airway and No signs of Nausea or vomiting  Post-op Vital Signs: Reviewed and stable  Complications: No apparent anesthesia complications

## 2014-11-11 ENCOUNTER — Encounter: Payer: Self-pay | Admitting: Ophthalmology

## 2014-11-14 NOTE — Progress Notes (Signed)
Rebecca Patterson  [phone] [phone]) [phone]     ID: [name] OB: December 06, 1934  MR#: [id number]  PIR#:[id number]  Patient Care Team: Margarita Rana, MD as PCP - General (Family Medicine)  CHIEF COMPLAINT/DIAGNOSIS:  Stage IA (pT1b pN0 (sn) cM0) grade 1 invasive mammary carcinoma of the right breast status post wide local excision and sentinel node biopsy on 04/05/11. Tumor size 9 mm, grade 1, margins uninvolved by invasive carcinoma.  No DCIS.  1 sentinel lymph node negative. ER/PR positive (90%),  HER-2/neu negative by FISH (HER2/CEP17 ratio 1.04).   Patient started anastrozole March 2013, changed to exemestane May 2013 due to side effects.   HPI:   Patient returns for continued oncology evaluation, she was seen 1 year ago. She is on hormonal therapy for history of breast cancer as described above. States she is doing well and offers no complaints. States she is tolerating Exemestane, denies any worsening of joint pains and stiffness in the pelvic area or lower extremities.  Denies any recurrent mood disturbances. Appetite is good, denies unintentional weight loss. Denies feeling any new breast masses on self-exam. No new cough or shortness of breath.    REVIEW OF SYSTEMS:   ROS As in HPI above. In addition, no fevers or sweats. No new headaches or focal weakness.  No new mood disturbances. No sore throat or dysphagia.  No abdominal pain, constipation, diarrhea, dysuria or hematuria. No new skin rash or bleeding symptoms. No new paresthesias in extremities. No polyuria polydipsia. PS ECOG 1.  PAST MEDICAL HISTORY: Reviewed. Past Medical History  Diagnosis Date  . Thyroid disease   . Osteoporosis   . Hyperlipidemia   . GERD (gastroesophageal reflux disease)   . Allergy   . Complication of anesthesia   . PONV (postoperative nausea and vomiting)   . Family history of adverse reaction to anesthesia     sister - slow to wake  . Wears hearing aid    bilateral  . Wears dentures     full upper and lower  . Vertigo     long ago  . Osteoporosis   . Diabetes mellitus without complication ([place])     pt denies - no meds    PAST SURGICAL HISTORY: Reviewed. Past Surgical History  Procedure Laterality Date  . Abdominal hysterectomy    . Cholecystectomy    . Carotid body tumor excision Right 10/19/2005  . Knee surgery Right 2005  . Bunionectomy Bilateral 2004  . Thyroid surgery N/A 1980    Goiter  . Mastoid surgery Right   . Skin cancer excision  03/17/2014    right side of face Dr. Aubery Lapping  . Cataract extraction w/phaco Right 11/10/2014    Procedure: CATARACT EXTRACTION PHACO AND INTRAOCULAR LENS PLACEMENT ([place]);  Surgeon: Leandrew Koyanagi, MD;  Location: [facility];  Service: Ophthalmology;  Laterality: Right;    FAMILY HISTORY: Reviewed. Family History  Problem Relation Age of Onset  . Stroke Sister   . Cancer Sister     breast     SOCIAL HISTORY: Reviewed. Social History  Substance Use Topics  . Smoking status: Never Smoker   . Smokeless tobacco: Never Used  . Alcohol Use: No   No Known Allergies  Current Outpatient Prescriptions  Medication Sig Dispense Refill  . calcium carbonate (OS-CAL) 600 MG TABS tablet Take by mouth.    Marland Kitchen exemestane (AROMASIN) 25 MG tablet Take by mouth.    . famotidine (PEPCID) 20 MG tablet Take by mouth.    Marland Kitchen  levothyroxine (SYNTHROID, LEVOTHROID) 50 MCG tablet Take by mouth.    . Multiple Vitamins-Minerals (PRESERVISION AREDS 2 PO) Take by mouth.    . nystatin cream (MYCOSTATIN) Apply 1 application topically 2 (two) times daily as needed for dry skin.    . Omega-3 Fatty Acids (FISH OIL DOUBLE STRENGTH) 1200 MG CAPS Take by mouth.     No current facility-administered medications for this visit.    PHYSICAL EXAM: Filed Vitals:   11/05/14 1048  BP: 110/78  Pulse: 67  Temp: 98.3 F (36.8 C)  Resp: 18     Body mass index is 28.37 kg/(m^2).       GENERAL: Patient is  alert and oriented and in no acute distress. No icterus. HEENT: EOMs intact. No cervical lymphadenopathy. CVS: S1S2, regular LUNGS: Bilaterally clear to auscultation, no rhonchi. ABDOMEN: Soft, nontender. No hepatomegaly.  EXTREMITIES: No pedal edema. BREASTS: continues to have thickening/scar tissue palpable in the right breast at the prior surgical site, no dominant masses otherwise. No masses palpable in left breast. No axillary adenopathy on either side.  Exam performed in presence of a nurse   LAB RESULTS:    Component Value Date/Time   NA 144 07/01/2013   K 4.6 07/01/2013   BUN 16 07/01/2013   CREATININE 0.79 11/05/2014 1038   CREATININE 0.85 11/04/2013 1029   CREATININE 0.8 07/01/2013   PROT 6.9 11/05/2014 1038   PROT 6.9 11/04/2013 1029   ALBUMIN 4.3 11/05/2014 1038   ALBUMIN 3.9 11/04/2013 1029   AST 17 11/05/2014 1038   AST 9* 11/04/2013 1029   ALT 13* 11/05/2014 1038   ALT 29 11/04/2013 1029   ALKPHOS 64 11/05/2014 1038   ALKPHOS 77 11/04/2013 1029   BILITOT 0.7 11/05/2014 1038   BILITOT 0.3 11/04/2013 1029   GFRNONAA >60 11/05/2014 1038   GFRNONAA >60 11/04/2013 1029   GFRNONAA >60 11/03/2012 1106   GFRAA >60 11/05/2014 1038   GFRAA >60 11/04/2013 1029   GFRAA >60 11/03/2012 1106    Lab Results  Component Value Date   WBC 5.4 11/05/2014   NEUTROABS 2.8 11/05/2014   HGB 13.6 11/05/2014   HCT 39.8 11/05/2014   MCV 90.3 11/05/2014   PLT 214 11/05/2014    STUDIES: 04/20/14 - Mammogram. IMPRESSION: Stable postlumpectomy changes right breast. No mammographic evidence for malignancy. RECOMMENDATION: Bilateral diagnostic mammography in 1 year.  BI-RADS CATEGORY  2: Benign.   ASSESSMENT / PLAN:   1. Stage IA (pT1b pN0 (sn) cM0) grade 1 invasive mammary carcinoma of the right breast status post wide local excision and sentinel node biopsy on 04/05/11. Tumor size 9 mm, grade 1, one sentinel lymph node negative, ER/PR positive (90%),  HER-2/neu negative by FISH  (HER2/CEP17 ratio 1.04)  -   reviewed labs from today and discussed with patient. She continues to do steady overall without any clinical evidence to suggest recurrent/metastatic breast cancer.  She is tolerating exemestane without new side effects, will  continue exemestane 25 mg by mouth daily, patient explained that she will need to take hormonal therapy for a total of 5 years. Will see her back in 1 year with CBC, Cr, LFT.   2. DEXA scan on 07/13/13 reported osteopenia (L-spine T-score of -2.3, left proximal femur T-score of -0.2, left femur neck T-score of -1.2. In April 2013 also DEXA reported Osteopenia) - patient is on calcium + vitamin D. Will request repeat DEXA scan for January 2017. 3. Will request for bilateral annual mammogram in March 2017. 4.  In between visits, patient was advised to call in case of any new side effects from exemestane or new breast masses felt on self-exam and will be evaluated sooner. She is agreeable to this plan.    Leia Alf, MD   11/14/2014 9:23 AM

## 2015-02-14 ENCOUNTER — Ambulatory Visit: Payer: Medicare Other

## 2015-02-15 ENCOUNTER — Ambulatory Visit
Admission: RE | Admit: 2015-02-15 | Discharge: 2015-02-15 | Disposition: A | Discharge status: Home / Self Care | Payer: Medicare Other | Source: Ambulatory Visit | Attending: Internal Medicine | Admitting: Internal Medicine

## 2015-02-15 DIAGNOSIS — M8580 Other specified disorders of bone density and structure, unspecified site: Secondary | ICD-10-CM

## 2015-02-15 DIAGNOSIS — C50911 Malignant neoplasm of unspecified site of right female breast: Secondary | ICD-10-CM

## 2015-02-15 DIAGNOSIS — Z13820 Encounter for screening for osteoporosis: Secondary | ICD-10-CM | POA: Insufficient documentation

## 2015-02-15 DIAGNOSIS — M8588 Other specified disorders of bone density and structure, other site: Secondary | ICD-10-CM | POA: Diagnosis not present

## 2015-02-15 DIAGNOSIS — Z853 Personal history of malignant neoplasm of breast: Secondary | ICD-10-CM | POA: Insufficient documentation

## 2015-02-15 HISTORY — DX: Diffuse cystic mastopathy of unspecified breast: N60.19

## 2015-02-18 ENCOUNTER — Ambulatory Visit (INDEPENDENT_AMBULATORY_CARE_PROVIDER_SITE_OTHER): Payer: Medicare Other | Admitting: Family Medicine

## 2015-02-18 ENCOUNTER — Encounter: Payer: Self-pay | Admitting: Family Medicine

## 2015-02-18 VITALS — BP 130/86 | HR 68 | Temp 97.8°F | Resp 16 | Ht 60.0 in | Wt 145.0 lb

## 2015-02-18 DIAGNOSIS — E119 Type 2 diabetes mellitus without complications: Secondary | ICD-10-CM | POA: Diagnosis not present

## 2015-02-18 DIAGNOSIS — M810 Age-related osteoporosis without current pathological fracture: Secondary | ICD-10-CM

## 2015-02-18 DIAGNOSIS — E039 Hypothyroidism, unspecified: Secondary | ICD-10-CM | POA: Diagnosis not present

## 2015-02-18 DIAGNOSIS — M818 Other osteoporosis without current pathological fracture: Secondary | ICD-10-CM

## 2015-02-18 DIAGNOSIS — E559 Vitamin D deficiency, unspecified: Secondary | ICD-10-CM

## 2015-02-18 DIAGNOSIS — Z23 Encounter for immunization: Secondary | ICD-10-CM | POA: Diagnosis not present

## 2015-02-18 DIAGNOSIS — T50905A Adverse effect of unspecified drugs, medicaments and biological substances, initial encounter: Secondary | ICD-10-CM

## 2015-02-18 LAB — POCT GLYCOSYLATED HEMOGLOBIN (HGB A1C)
Est. average glucose Bld gHb Est-mCnc: 131
Hemoglobin A1C: 6.2

## 2015-02-18 NOTE — Patient Instructions (Signed)
Please exercise daily.

## 2015-02-18 NOTE — Progress Notes (Signed)
Patient ID: Rebecca Patterson, female   DOB: 02-05-1935, 81 y.o.   MRN: [id number]       Patient: Rebecca Patterson Female    DOB: 1934/12/07   81 y.o.   MRN: [id number] Visit Date: 02/18/2015  Today's Provider: Margarita Rana, MD   Chief Complaint  Patient presents with  . Diabetes  . Hypothyroidism   Subjective:    HPI  Diabetes Mellitus Type II, Follow-up:   Lab Results  Component Value Date   HGBA1C 6.2 02/18/2015   HGBA1C 6.4* 08/17/2014   HGBA1C 6.2* 07/01/2013    Last seen for diabetes 6 months ago.  Management since then includes check lab. She reports excellent compliance with eating healthy.   She is not having side effects.  Current symptoms include none and  have been stable. Home blood sugar records: not being checked  Episodes of hypoglycemia? no   Current Insulin Regimen: none Most Recent Eye Exam: 08/2014 [facility] Weight trend: stable Prior visit with dietician: no Current diet: in general, a "healthy" diet   Current exercise: none   Does try to relax and do a lot of adult coloring books.   Pertinent Labs:    Component Value Date/Time   CHOL 204* 07/01/2013   TRIG 137 07/01/2013   HDL 59 07/01/2013   LDLCALC 118 07/01/2013   CREATININE 0.79 11/05/2014 1038   CREATININE 0.85 11/04/2013 1029   CREATININE 0.8 07/01/2013    Wt Readings from Last 3 Encounters:  02/18/15 145 lb (65.772 kg)  11/10/14 144 lb (65.318 kg)  11/05/14 145 lb 4.5 oz (65.9 kg)   ------------------------------------------------------------------------  Hypothyroid, follow-up:  TSH  Date Value Ref Range Status  08/17/2014 1.480 0.450 - 4.500 uIU/mL Final  07/01/2013 1.07 0.41 - 5.90 uIU/mL Final    She was last seen for hypothyroid 6 months ago.  Management since that visit includes labs order. She reports excellent compliance with treatment.  Takes her medication regularly.  She is not having side effects.  She is not exercising. She is experiencing none She denies  palpitations Weight trend: stable  ------------------------------------------------------------------------   Follow up for bone density  The patient was last seen for this 6 months ago. Changes made at last visit include patient discontinued Fosamax prescribed by Dr. Jefm Bryant.  She reports poor compliance with treatment. She feels that condition is Unchanged. She is not having side effects.  Patient had bone density rechecked on 02/15/2015 ordered by Dr. Ma Hillock. Patient reports she will not take Fosamax  due to side effect, pt reports medication was causing her to be mean and nervousness. Is still taking her Aromasin. Was offered 6 month shot and can not afford it at this time.   ------------------------------------------------------------------------------------       No Known Allergies Previous Medications   CALCIUM CARBONATE (OS-CAL) 600 MG TABS TABLET    Take 600 mg by mouth daily with breakfast.    EXEMESTANE (AROMASIN) 25 MG TABLET    Take 25 mg by mouth daily after breakfast.   FAMOTIDINE (PEPCID) 20 MG TABLET    Take 20 mg by mouth as needed.    LEVOTHYROXINE (SYNTHROID, LEVOTHROID) 50 MCG TABLET    Take 50 mcg by mouth daily before breakfast.    MULTIPLE VITAMINS-MINERALS (PRESERVISION AREDS 2 PO)    Take 1 tablet by mouth 2 (two) times daily.    NYSTATIN CREAM (MYCOSTATIN)    Apply 1 application topically 2 (two) times daily as needed for dry skin.   OMEGA-3  FATTY ACIDS (FISH OIL DOUBLE STRENGTH) 1200 MG CAPS    Take 1 capsule by mouth daily.     Review of Systems  Constitutional: Negative.   Cardiovascular: Negative.   Gastrointestinal: Negative.   Endocrine: Negative.   Musculoskeletal: Negative.     Social History  Substance Use Topics  . Smoking status: Never Smoker   . Smokeless tobacco: Never Used  . Alcohol Use: No   Objective:   BP 130/86 mmHg  Pulse 68  Temp(Src) 97.8 F (36.6 C)  Resp 16  Ht 5' (1.524 m)  Wt 145 lb (65.772 kg)  BMI 28.32  kg/m2  SpO2 98%  Physical Exam  Constitutional: She is oriented to person, place, and time. She appears well-developed and well-nourished.  Cardiovascular: Normal rate, regular rhythm and normal heart sounds.   Pulmonary/Chest: Effort normal and breath sounds normal.  Neurological: She is alert and oriented to person, place, and time.  Psychiatric: She has a normal mood and affect. Her behavior is normal.  Tearful when discussing Fosamax reaction.       Assessment & Plan:     1. Type 2 diabetes mellitus without complication, without long-term current use of insulin (HCC) Stable. Patient advised to continue healthy lifestyle. - POCT glycosylated hemoglobin (Hb A1C) Results for orders placed or performed in visit on 02/18/15  POCT glycosylated hemoglobin (Hb A1C)  Result Value Ref Range   Hemoglobin A1C 6.2    Est. average glucose Bld gHb Est-mCnc 131      2. Hypothyroidism, unspecified hypothyroidism type Stable. Patient advised to continue current medication and plan of care.  3. Osteoporosis Stable. Patient reports she will continue to take Aromasin. Patient reports she will not restart Fosamax due to previous side effects. Patient advised to exercise daily. Recheck bone density in 2 years.  Will also check Vit D level.   4. Need for influenza vaccination - Flu Vaccine QUAD 36+ mos IM  5. Avitaminosis D F/U pending lab report. - VITAMIN D 25 Hydroxy (Vit-D Deficiency, Fractures)      Patient seen and examined by Dr. Jerrell Belfast, and note scribed by Philbert Riser. Dimas, CMA.  I have reviewed the document for accuracy and completeness and I agree with above. - Jerrell Belfast, MD     Margarita Rana, MD  [facility]

## 2015-02-19 LAB — VITAMIN D 25 HYDROXY (VIT D DEFICIENCY, FRACTURES): Vit D, 25-Hydroxy: 38.2 ng/mL (ref 30.0–100.0)

## 2015-02-21 ENCOUNTER — Telehealth: Payer: Self-pay

## 2015-02-21 NOTE — Telephone Encounter (Signed)
Advised pt of lab results. Pt verbally acknowledges understanding. Rebecca Patterson, CMA   

## 2015-02-21 NOTE — Telephone Encounter (Signed)
-----   Message from Margarita Rana, MD sent at 02/19/2015  7:51 AM EST ----- VIt D normal. Please notify patient. Thanks.

## 2015-03-02 DIAGNOSIS — L821 Other seborrheic keratosis: Secondary | ICD-10-CM | POA: Diagnosis not present

## 2015-03-02 DIAGNOSIS — Z1283 Encounter for screening for malignant neoplasm of skin: Secondary | ICD-10-CM | POA: Diagnosis not present

## 2015-03-02 DIAGNOSIS — Z08 Encounter for follow-up examination after completed treatment for malignant neoplasm: Secondary | ICD-10-CM | POA: Diagnosis not present

## 2015-03-02 DIAGNOSIS — Z85828 Personal history of other malignant neoplasm of skin: Secondary | ICD-10-CM | POA: Diagnosis not present

## 2015-03-02 DIAGNOSIS — L570 Actinic keratosis: Secondary | ICD-10-CM | POA: Diagnosis not present

## 2015-04-19 DIAGNOSIS — H353132 Nonexudative age-related macular degeneration, bilateral, intermediate dry stage: Secondary | ICD-10-CM | POA: Diagnosis not present

## 2015-04-19 DIAGNOSIS — Z961 Presence of intraocular lens: Secondary | ICD-10-CM | POA: Diagnosis not present

## 2015-04-21 ENCOUNTER — Other Ambulatory Visit: Payer: Self-pay | Admitting: Internal Medicine

## 2015-04-21 ENCOUNTER — Ambulatory Visit
Admission: RE | Admit: 2015-04-21 | Discharge: 2015-04-21 | Disposition: A | Discharge status: Home / Self Care | Payer: Medicare Other | Source: Ambulatory Visit | Attending: Internal Medicine | Admitting: Internal Medicine

## 2015-04-21 DIAGNOSIS — C50911 Malignant neoplasm of unspecified site of right female breast: Secondary | ICD-10-CM

## 2015-04-21 DIAGNOSIS — M8580 Other specified disorders of bone density and structure, unspecified site: Secondary | ICD-10-CM | POA: Diagnosis not present

## 2015-04-21 DIAGNOSIS — R928 Other abnormal and inconclusive findings on diagnostic imaging of breast: Secondary | ICD-10-CM | POA: Diagnosis not present

## 2015-04-21 HISTORY — DX: Malignant (primary) neoplasm, unspecified: C80.1

## 2015-04-21 HISTORY — DX: Malignant neoplasm of unspecified site of unspecified female breast: C50.919

## 2015-06-03 ENCOUNTER — Other Ambulatory Visit: Payer: Self-pay | Admitting: Family Medicine

## 2015-06-03 DIAGNOSIS — E039 Hypothyroidism, unspecified: Secondary | ICD-10-CM

## 2015-06-05 ENCOUNTER — Other Ambulatory Visit: Payer: Self-pay | Admitting: Family Medicine

## 2015-06-05 DIAGNOSIS — E039 Hypothyroidism, unspecified: Secondary | ICD-10-CM

## 2015-08-01 ENCOUNTER — Other Ambulatory Visit: Payer: Self-pay | Admitting: *Deleted

## 2015-08-01 MED ORDER — EXEMESTANE 25 MG PO TABS: 25.0000 mg | ORAL_TABLET | Freq: Every day | ORAL | Status: DC

## 2015-08-04 ENCOUNTER — Other Ambulatory Visit: Payer: Self-pay | Admitting: Family Medicine

## 2015-08-07 ENCOUNTER — Other Ambulatory Visit: Payer: Self-pay | Admitting: Internal Medicine

## 2015-08-15 ENCOUNTER — Other Ambulatory Visit: Payer: Self-pay | Admitting: Family Medicine

## 2015-08-15 DIAGNOSIS — W57XXXA Bitten or stung by nonvenomous insect and other nonvenomous arthropods, initial encounter: Secondary | ICD-10-CM

## 2015-08-15 MED ORDER — TRIAMCINOLONE ACETONIDE 0.1 % EX CREA
1.0000 | TOPICAL_CREAM | Freq: Three times a day (TID) | CUTANEOUS | Status: DC
Start: 2015-08-15 — End: 2016-02-25

## 2015-08-18 ENCOUNTER — Ambulatory Visit (INDEPENDENT_AMBULATORY_CARE_PROVIDER_SITE_OTHER): Payer: Medicare Other | Admitting: Family Medicine

## 2015-08-18 ENCOUNTER — Encounter: Payer: Medicare Other | Admitting: Family Medicine

## 2015-08-18 ENCOUNTER — Encounter: Payer: Self-pay | Admitting: Family Medicine

## 2015-08-18 VITALS — BP 124/60 | HR 68 | Temp 97.8°F | Resp 16 | Ht 60.0 in | Wt 146.0 lb

## 2015-08-18 DIAGNOSIS — R7303 Prediabetes: Secondary | ICD-10-CM

## 2015-08-18 DIAGNOSIS — E785 Hyperlipidemia, unspecified: Secondary | ICD-10-CM | POA: Diagnosis not present

## 2015-08-18 DIAGNOSIS — E039 Hypothyroidism, unspecified: Secondary | ICD-10-CM

## 2015-08-18 DIAGNOSIS — Z0000 Encounter for general adult medical examination without abnormal findings: Secondary | ICD-10-CM | POA: Diagnosis not present

## 2015-08-18 NOTE — Progress Notes (Signed)
Patient ID: Rebecca Patterson, female   DOB: May 24, 1934, 81 y.o.   MRN: [id number]       Patient: Rebecca Patterson, Female    DOB: January 08, 1935, 81 y.o.   MRN: [id number] Visit Date: 08/18/2015  Today's Provider: Lelon Huh, MD   Chief Complaint  Patient presents with  . Annual Exam  . Diabetes  . Hypothyroidism   Subjective:   This is a former patient of Dr. Sharyon Medicus who is new to me presenting to establish care, yearly physical and follow up chronic medical conditions.   Annual physical  Rebecca Patterson is a 81 y.o. female. She feels well. She reports exercising daily. She reports she is sleeping fairly well.  BMD- 02/15/15 Mammogram- 04/21/15 BI RADS 3 [facility] recommended diagnotic mammogram with magification views in 6 months (10/2015). Is being followed by oncologist, is scheduled to see Dr. B next month  Colonoscopy- Never -----------------------------------------------------------  Pre-Diabetes Mellitus Type II, Follow-up:   Lab Results  Component Value Date   HGBA1C 6.2 02/18/2015   HGBA1C 6.4* 08/17/2014   HGBA1C 6.2* 07/01/2013    Last seen for diabetes 6 months ago.  Management since then includes none. She reports good compliance with treatment. She is not having side effects.  Current symptoms include none and have been unchanged. Home blood sugar records: not being checked  Episodes of hypoglycemia? no   Current Insulin Regimen: n/a Most Recent Eye Exam: about a month ago Weight trend: stable Current exercise: walking  Pertinent Labs:    Component Value Date/Time   CHOL 204* 07/01/2013   TRIG 137 07/01/2013   HDL 59 07/01/2013   LDLCALC 118 07/01/2013   CREATININE 0.79 11/05/2014 1038   CREATININE 0.85 11/04/2013 1029   CREATININE 0.8 07/01/2013    Wt Readings from Last 3 Encounters:  08/18/15 146 lb (66.225 kg)  02/18/15 145 lb (65.772 kg)  11/10/14 144 lb (65.318 kg)     ------------------------------------------------------------------------   Hypothyroidism Rebecca Patterson is a 81 y.o. female who presents for follow up of hypothyroidism. Current symptoms: none .Feels well on current dose of levothyroxine.      Review of Systems  Constitutional: Negative.   HENT: Positive for hearing loss.   Eyes: Negative.   Respiratory: Negative.   Cardiovascular: Negative.   Gastrointestinal: Negative.   Endocrine: Negative.   Genitourinary: Negative.   Musculoskeletal: Negative.   Skin: Negative.   Allergic/Immunologic: Negative.   Neurological: Negative.   Hematological: Negative.   Psychiatric/Behavioral: Negative.     Social History   Social History  . Marital Status: Married    Spouse Name: Juanda Crumble  . Number of Children: 2  . Years of Education: N/A   Occupational History  . Not on file.   Social History Main Topics  . Smoking status: Never Smoker   . Smokeless tobacco: Never Used  . Alcohol Use: No  . Drug Use: No  . Sexual Activity: Not on file   Other Topics Concern  . Not on file   Social History Narrative    Past Medical History  Diagnosis Date  . Thyroid disease   . Osteoporosis   . Hyperlipidemia   . GERD (gastroesophageal reflux disease)   . Allergy   . Complication of anesthesia   . PONV (postoperative nausea and vomiting)   . Family history of adverse reaction to anesthesia     sister - slow to wake  . Wears hearing aid     bilateral  . Wears dentures  full upper and lower  . Vertigo     long ago  . Osteoporosis   . Diabetes mellitus without complication ([place])     pt denies - no meds  . Fibrocystic breast   . Cancer ([place]) 2013    Right Breast- radiation and Tamoxifen  . Breast cancer ([place]) 2013    Rt     Patient Active Problem List   Diagnosis Date Noted  . Awareness of heartbeats 08/17/2014  . 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 Cardiac conduction disorder 06/17/2014  . Diabetes ([place])  06/17/2014  . HLD (hyperlipidemia) 06/17/2014  . Adult hypothyroidism 06/17/2014  . Drug-induced osteoporosis 06/17/2014  . 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 Avitaminosis D 06/17/2014    Past Surgical History  Procedure Laterality Date  . Abdominal hysterectomy    . Cholecystectomy    . Carotid body tumor excision Right 10/19/2005  . Knee surgery Right 2005  . Bunionectomy Bilateral 2004  . Thyroid surgery N/A 1980    Goiter  . Mastoid surgery Right   . Skin cancer excision  03/17/2014    right side of face Dr. Aubery Lapping  . Cataract extraction w/phaco Right 11/10/2014    Procedure: CATARACT EXTRACTION PHACO AND INTRAOCULAR LENS PLACEMENT ([place]);  Surgeon: Leandrew Koyanagi, MD;  Location: [facility];  Service: Ophthalmology;  Laterality: Right;  . Breast biopsy Right 2013    Stereo- Positive  . Breast excisional biopsy Right 2013    Her family history includes Breast cancer (age of onset: 33) in her sister; Cancer in her sister; Stroke in her sister.    Current Meds  Medication Sig  . calcium carbonate (OS-CAL) 600 MG TABS tablet Take 600 mg by mouth daily with breakfast.   . exemestane (AROMASIN) 25 MG tablet Take 1 tablet (25 mg total) by mouth daily after breakfast.  . famotidine (PEPCID) 20 MG tablet Take 20 mg by mouth as needed.   Marland Kitchen ketoconazole (NIZORAL) 2 % cream   . levothyroxine (SYNTHROID, LEVOTHROID) 50 MCG tablet TAKE 1 TABLET BY MOUTH DAILY  . Multiple Vitamins-Minerals (PRESERVISION AREDS 2 PO) Take 1 tablet by mouth 2 (two) times daily.   Marland Kitchen nystatin cream (MYCOSTATIN) APPLY TO AFFECTED AREA TWICE A DAY AS NEEDED  . Omega-3 Fatty Acids (FISH OIL DOUBLE STRENGTH) 1200 MG CAPS Take 1 capsule by mouth daily.   Marland Kitchen triamcinolone cream (KENALOG) 0.1 % Apply 1 application topically 3 (three) times daily. For insect bites.    Patient Care Team: Birdie Sons, MD as PCP - General (Family Medicine)    Objective:   Vitals: BP 124/60 mmHg  Pulse 68   Temp(Src) 97.8 F (36.6 C) (Oral)  Resp 16  Ht 5' (1.524 m)  Wt 146 lb (66.225 kg)  BMI 28.51 kg/m2  Physical Exam   General Appearance:    Alert, cooperative, no distress, appears stated age  Head:    Normocephalic, without obvious abnormality, atraumatic  Eyes:    PERRL, conjunctiva/corneas clear, EOM's intact, fundi    benign, both eyes  Ears:    Normal TM's and external ear canals, both ears  Nose:   Nares normal, septum midline, mucosa normal, no drainage    or sinus tenderness  Throat:   Lips, mucosa, and tongue normal; teeth and gums normal  Neck:   Supple, symmetrical, trachea midline, no adenopathy;    thyroid:  no enlargement/tenderness/nodules; no carotid   bruit or JVD  Back:     Symmetric, no curvature, ROM normal, no CVA  tenderness  Lungs:     Clear to auscultation bilaterally, respirations unlabored  Chest Wall:    No tenderness or deformity   Heart:    Regular rate and rhythm, S1 and S2 normal, no murmur, rub   or gallop  Breast Exam:    deferred to oncology  Abdomen:     Soft, non-tender, bowel sounds active all four quadrants,    no masses, no organomegaly  Pelvic:    deferred  Extremities:   Extremities normal, atraumatic, no cyanosis or edema  Pulses:   2+ and symmetric all extremities  Skin:   Skin color, texture, turgor normal, no rashes or lesions  Lymph nodes:   Cervical, supraclavicular, and axillary nodes normal  Neurologic:   CNII-XII intact, normal strength, sensation and reflexes    throughout    Activities of Daily Living In your present state of health, do you have any difficulty performing the following activities: 08/18/2015 11/10/2014  Hearing? Tempie Donning  Vision? N N  Difficulty concentrating or making decisions? N N  Walking or climbing stairs? N N  Dressing or bathing? N N  Doing errands, shopping? N -    Fall Risk Assessment Fall Risk  08/18/2015 08/18/2015 08/17/2014  Falls in the past year? No No No     Depression Screen PHQ 2/9 Scores  08/18/2015 08/18/2015 08/17/2014  PHQ - 2 Score 0 0 0    Cognitive Testing - 6-CIT  Correct? Score   What year is it? yes 0 0 or 4  What month is it? yes 0 0 or 3  Memorize:    Pia Mau,  44,  [address],  [place],      What time is it? (within 1 hour) yes 0 0 or 3  Count backwards from 20 yes 0 0, 2, or 4  Name the months of the year yes 0 0, 2, or 4  Repeat name & address above no 4 0, 2, 4, 6, 8, or 10       TOTAL SCORE  4/28   Interpretation:  Normal  Normal (0-7) Abnormal (8-28)       Assessment & Plan:     Annual physical  Reviewed patient's Family Medical History Reviewed and updated list of patient's medical providers Assessment of cognitive impairment was done Assessed patient's functional ability Established a written schedule for health screening [place] Completed and Reviewed  Exercise Activities and Dietary recommendations Goals    . Exercise 150 minutes per week (moderate activity)       Immunization History  Administered Date(s) Administered  . Influenza,inj,Quad PF,36+ Mos 02/18/2015  . Influenza-Unspecified 10/06/2013  . Pneumococcal Conjugate-13 10/21/2013  . Pneumococcal Polysaccharide-23 01/20/2010  . Td 09/03/2005    Health Maintenance  Topic Date Due  . FOOT EXAM  01/02/1947  . OPHTHALMOLOGY EXAM  01/02/1947  . URINE MICROALBUMIN  01/02/1947  . ZOSTAVAX  01/01/1997  . HEMOGLOBIN A1C  08/18/2015  . TETANUS/TDAP  09/04/2015  . INFLUENZA VACCINE  09/06/2015  . DEXA SCAN  Completed  . PNA vac Low Risk Adult  Completed      Discussed health benefits of physical activity, and encouraged her to engage in regular exercise appropriate for her age and condition.    ------------------------------------------------------------------------------------------------------------ 1. Annual physical exam Generally doing well. Refused colonoscopy, but is willing to do stool test for screening.  - Cologuard  2.  Hypothyroidism, unspecified hypothyroidism type  - Renal function panel - TSH  3. Pre-diabetes  -  Renal function panel - Hemoglobin A1c  4. HLD (hyperlipidemia) Has not required medications.  - Renal function panel - Lipid panel    Lelon Huh, MD  [facility]

## 2015-08-19 ENCOUNTER — Telehealth: Payer: Self-pay

## 2015-08-19 LAB — RENAL FUNCTION PANEL
Albumin: 4.8 g/dL (ref 3.5–4.8)
BUN / CREAT RATIO: 21 (ref 12–28)
BUN: 16 mg/dL (ref 8–27)
CO2: 27 mmol/L (ref 18–29)
CREATININE: 0.78 mg/dL (ref 0.57–1.00)
Calcium: 9.4 mg/dL (ref 8.7–10.3)
Chloride: 97 mmol/L (ref 96–106)
GFR, EST AFRICAN AMERICAN: 84 mL/min/{1.73_m2} (ref >59)
GFR, EST NON AFRICAN AMERICAN: 73 mL/min/{1.73_m2} (ref >59)
Glucose: 108 mg/dL — ABNORMAL HIGH (ref 65–99)
Phosphorus: 3.7 mg/dL (ref 2.5–4.5)
Potassium: 4.3 mmol/L (ref 3.5–5.2)
Sodium: 141 mmol/L (ref 134–144)

## 2015-08-19 LAB — LIPID PANEL
CHOL/HDL RATIO: 3.8 ratio (ref 0.0–4.4)
Cholesterol, Total: 204 mg/dL — ABNORMAL HIGH (ref 100–199)
HDL: 53 mg/dL (ref >39)
LDL CALC: 107 mg/dL — AB (ref 0–99)
Triglycerides: 220 mg/dL — ABNORMAL HIGH (ref 0–149)
VLDL CHOLESTEROL CAL: 44 mg/dL — AB (ref 5–40)

## 2015-08-19 LAB — HEMOGLOBIN A1C
Est. average glucose Bld gHb Est-mCnc: 128 mg/dL
HEMOGLOBIN A1C: 6.1 % — AB (ref 4.8–5.6)

## 2015-08-19 LAB — TSH: TSH: 0.933 u[IU]/mL (ref 0.450–4.500)

## 2015-08-19 NOTE — Telephone Encounter (Signed)
Pt advised.  She agreed to start 81mg  of Aspirin a day, but does not want to start a cholesterol medicine at this time.   Thanks,   -Mickel Baas

## 2015-08-19 NOTE — Telephone Encounter (Signed)
-----   Message from Birdie Sons, MD sent at 08/19/2015  8:15 AM EDT ----- Cholesterol is a little elevated at 204. Sugar shows she is borderline diabetic with average sugar of 128. Cholesterol is borderline for requiring medications. She should at least take 81mg  coated aspirin a day. Suggest she try pravastatin 20mg  daily, #30, rf x 3 for cholesterol and follow up 4 months.

## 2015-08-22 NOTE — Telephone Encounter (Signed)
Pt called stating she has changed her mind and would like to start cholesterol medication.  [facility].  [id number]

## 2015-08-28 MED ORDER — PRAVASTATIN SODIUM 40 MG PO TABS: 40.0000 mg | ORAL_TABLET | Freq: Every day | ORAL | 1 refills | Status: DC

## 2015-08-28 NOTE — Telephone Encounter (Signed)
[facility]

## 2015-08-30 NOTE — Telephone Encounter (Signed)
Advised patient as below.  

## 2015-10-25 ENCOUNTER — Other Ambulatory Visit: Payer: Self-pay | Admitting: Family Medicine

## 2015-11-04 ENCOUNTER — Inpatient Hospital Stay: Payer: Medicare Other | Admitting: Internal Medicine

## 2015-11-04 ENCOUNTER — Inpatient Hospital Stay: Payer: Medicare Other

## 2015-11-11 ENCOUNTER — Ambulatory Visit (INDEPENDENT_AMBULATORY_CARE_PROVIDER_SITE_OTHER): Payer: Medicare Other

## 2015-11-11 ENCOUNTER — Other Ambulatory Visit: Payer: Self-pay

## 2015-11-11 DIAGNOSIS — Z23 Encounter for immunization: Secondary | ICD-10-CM | POA: Diagnosis not present

## 2015-11-11 DIAGNOSIS — C801 Malignant (primary) neoplasm, unspecified: Secondary | ICD-10-CM

## 2015-11-16 ENCOUNTER — Inpatient Hospital Stay: Payer: Medicare Other

## 2015-11-16 ENCOUNTER — Inpatient Hospital Stay: Payer: Medicare Other | Attending: Internal Medicine | Admitting: Internal Medicine

## 2015-11-16 ENCOUNTER — Other Ambulatory Visit: Payer: Self-pay

## 2015-11-16 ENCOUNTER — Encounter: Payer: Self-pay | Admitting: Internal Medicine

## 2015-11-16 DIAGNOSIS — M818 Other osteoporosis without current pathological fracture: Secondary | ICD-10-CM | POA: Insufficient documentation

## 2015-11-16 DIAGNOSIS — C50811 Malignant neoplasm of overlapping sites of right female breast: Secondary | ICD-10-CM | POA: Insufficient documentation

## 2015-11-16 DIAGNOSIS — Z170 Estrogen receptor positive status [ER+]: Principal | ICD-10-CM

## 2015-11-16 DIAGNOSIS — R42 Dizziness and giddiness: Secondary | ICD-10-CM | POA: Insufficient documentation

## 2015-11-16 DIAGNOSIS — E079 Disorder of thyroid, unspecified: Secondary | ICD-10-CM | POA: Diagnosis not present

## 2015-11-16 DIAGNOSIS — E785 Hyperlipidemia, unspecified: Secondary | ICD-10-CM | POA: Diagnosis not present

## 2015-11-16 DIAGNOSIS — Z7982 Long term (current) use of aspirin: Secondary | ICD-10-CM | POA: Insufficient documentation

## 2015-11-16 DIAGNOSIS — Z79811 Long term (current) use of aromatase inhibitors: Secondary | ICD-10-CM

## 2015-11-16 DIAGNOSIS — E119 Type 2 diabetes mellitus without complications: Secondary | ICD-10-CM | POA: Insufficient documentation

## 2015-11-16 DIAGNOSIS — K219 Gastro-esophageal reflux disease without esophagitis: Secondary | ICD-10-CM | POA: Insufficient documentation

## 2015-11-16 DIAGNOSIS — C801 Malignant (primary) neoplasm, unspecified: Secondary | ICD-10-CM

## 2015-11-16 DIAGNOSIS — Z923 Personal history of irradiation: Secondary | ICD-10-CM | POA: Insufficient documentation

## 2015-11-16 LAB — COMPREHENSIVE METABOLIC PANEL
ALK PHOS: 60 U/L (ref 38–126)
ALT: 15 U/L (ref 14–54)
AST: 15 U/L (ref 15–41)
Albumin: 4.5 g/dL (ref 3.5–5.0)
Anion gap: 9 (ref 5–15)
BILIRUBIN TOTAL: 0.5 mg/dL (ref 0.3–1.2)
BUN: 19 mg/dL (ref 6–20)
CALCIUM: 8.7 mg/dL — AB (ref 8.9–10.3)
CO2: 28 mmol/L (ref 22–32)
CREATININE: 0.69 mg/dL (ref 0.44–1.00)
Chloride: 101 mmol/L (ref 101–111)
GFR calc non Af Amer: >60 mL/min (ref >60)
GLUCOSE: 113 mg/dL — AB (ref 65–99)
Potassium: 4.2 mmol/L (ref 3.5–5.1)
Sodium: 138 mmol/L (ref 135–145)
TOTAL PROTEIN: 7 g/dL (ref 6.5–8.1)

## 2015-11-16 LAB — CBC WITH DIFFERENTIAL/PLATELET
BASOS ABS: 0 10*3/uL (ref 0–0.1)
BASOS PCT: 1 %
Eosinophils Absolute: 0.1 10*3/uL (ref 0–0.7)
Eosinophils Relative: 1 %
HEMATOCRIT: 38.2 % (ref 35.0–47.0)
Hemoglobin: 13 g/dL (ref 12.0–16.0)
Lymphocytes Relative: 38 %
Lymphs Abs: 2 10*3/uL (ref 1.0–3.6)
MCH: 31.1 pg (ref 26.0–34.0)
MCHC: 34 g/dL (ref 32.0–36.0)
MCV: 91.3 fL (ref 80.0–100.0)
Monocytes Absolute: 0.5 10*3/uL (ref 0.2–0.9)
Monocytes Relative: 9 %
NEUTROS ABS: 2.6 10*3/uL (ref 1.4–6.5)
NEUTROS PCT: 51 %
Platelets: 201 10*3/uL (ref 150–440)
RBC: 4.19 MIL/uL (ref 3.80–5.20)
RDW: 13 % (ref 11.5–14.5)
WBC: 5.2 10*3/uL (ref 3.6–11.0)

## 2015-11-16 NOTE — Progress Notes (Signed)
Last Mammogram in March 2017.

## 2015-11-16 NOTE — Progress Notes (Signed)
[facility] OFFICE PROGRESS NOTE  Patient Care Team: Birdie Sons, MD as PCP - General (Family Medicine) Leandrew Koyanagi, MD as Referring Physician (Ophthalmology) Leia Alf (Oncology) Berton Mount (Radiation Oncology)  No matching staging information was found for the patient.   Oncology History   # 2013- Stage IA (pT1b pN0 (sn) cM0) grade 1 invasive mammary carcinoma of the right breast status post wide local excision and sentinel node biopsy on 04/05/11.; Tumor size 9 mm, grade 1, margins uninvolved by invasive carcinoma.  No DCIS.  1 sentinel lymph node negative;  ER/PR positive (90%),  HER-2/neu negative by FISH (HER2/CEP17 ratio 1.04). S/p RT; no chemo.   # Patient started anastrozole March 2013, changed to exemestane May 2013 due to side effects [until May 2018]     Carcinoma of overlapping sites of right breast in female, estrogen receptor positive ([place])    This is my first interaction with the patient as patient's primary oncologist has been Dr.Pandit I reviewed the patient's prior charts/pertinent labs/imaging in detail; findings are summarized above.    INTERVAL HISTORY:  Rebecca Patterson 81 y.o.  female pleasant patient above history of Breast cancer right-sided on exemestane is here for follow-up.  Patient denies any lumps or bumps. Her appetite is good. No bone pain. No nausea no vomiting. Patient denies any hot flashes. Denies any unusual bone pain or joint pains.  Patient had a mammogram in March 2017 found to have dystrophic calcifications and recommended a mammogram in 6 months.  REVIEW OF SYSTEMS:  A complete 10 point review of system is done which is negative except mentioned above/history of present illness.   PAST MEDICAL HISTORY :  Past Medical History:  Diagnosis Date  . Allergy   . Breast cancer ([place]) 2013   Rt  . Cancer [facility]) 2013   Right Breast- radiation and Tamoxifen  . Complication of anesthesia   . Diabetes mellitus  without complication ([place])    pt denies - no meds  . Family history of adverse reaction to anesthesia    sister - slow to wake  . Fibrocystic breast   . GERD (gastroesophageal reflux disease)   . Hyperlipidemia   . Osteoporosis   . Osteoporosis   . PONV (postoperative nausea and vomiting)   . Thyroid disease   . Vertigo    long ago  . Wears dentures    full upper and lower  . Wears hearing aid    bilateral    PAST SURGICAL HISTORY :   Past Surgical History:  Procedure Laterality Date  . ABDOMINAL HYSTERECTOMY    . BREAST BIOPSY Right 2013   Stereo- Positive  . BREAST EXCISIONAL BIOPSY Right 2013  . BUNIONECTOMY Bilateral 2004  . 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 CATARACT EXTRACTION W/PHACO Right 11/10/2014   Procedure: CATARACT EXTRACTION PHACO AND INTRAOCULAR LENS PLACEMENT ([facility]);  Surgeon: Leandrew Koyanagi, MD;  Location: [facility];  Service: Ophthalmology;  Laterality: Right;  . CHOLECYSTECTOMY    . KNEE SURGERY Right 2005  . Mastoid surgery Right   . SKIN CANCER EXCISION  03/17/2014   right side of face Dr. Aubery Lapping  . THYROID SURGERY N/A 1980   Goiter    FAMILY HISTORY :   Family History  Problem Relation Age of Onset  . Stroke Sister   . Breast cancer Sister 29  . Cancer Sister     breast     SOCIAL HISTORY:   Social History  Substance Use Topics  . Smoking status: Never Smoker  . Smokeless tobacco: Never Used  . Alcohol use No    ALLERGIES:  is allergic to fosamax [alendronate].  MEDICATIONS:  Current Outpatient Prescriptions  Medication Sig Dispense Refill  . aspirin EC 81 MG tablet Take 81 mg by mouth daily.    . calcium carbonate (OS-CAL) 600 MG TABS tablet Take 600 mg by mouth daily with breakfast.     . exemestane (AROMASIN) 25 MG tablet Take 1 tablet (25 mg total) by mouth daily after breakfast. 90 tablet 1  . famotidine (PEPCID) 20 MG tablet Take 20 mg by mouth as needed.     Marland Kitchen ketoconazole (NIZORAL) 2 %  cream     . levothyroxine (SYNTHROID, LEVOTHROID) 50 MCG tablet TAKE 1 TABLET BY MOUTH DAILY 90 tablet 1  . Multiple Vitamins-Minerals (PRESERVISION AREDS 2 PO) Take 1 tablet by mouth 2 (two) times daily.     Marland Kitchen nystatin cream (MYCOSTATIN) APPLY TO AFFECTED AREA TWICE A DAY AS NEEDED 60 g 2  . Omega-3 Fatty Acids (FISH OIL DOUBLE STRENGTH) 1200 MG CAPS Take 1 capsule by mouth daily.     . pravastatin (PRAVACHOL) 40 MG tablet TAKE 1 TABLET (40 MG TOTAL) BY MOUTH DAILY. 30 tablet 6  . triamcinolone cream (KENALOG) 0.1 % Apply 1 application topically 3 (three) times daily. For insect bites. 60 g 0   No current facility-administered medications for this visit.     PHYSICAL EXAMINATION: ECOG PERFORMANCE STATUS: 0 - Asymptomatic  BP (!) 145/83 (BP Location: Left Arm, Patient Position: Sitting)   Pulse 63   Temp 98 F (36.7 C) (Oral)   Resp 17   Ht 5' (1.524 m)   Wt 146 lb 6.4 oz (66.4 kg)   BMI 28.59 kg/m   Filed Weights   11/16/15 1134  Weight: 146 lb 6.4 oz (66.4 kg)    GENERAL: Well-nourished well-developed; Alert, no distress and comfortable.   Alone.  EYES: no pallor or icterus OROPHARYNX: no thrush or ulceration; good dentition  NECK: supple, no masses felt LYMPH:  no palpable lymphadenopathy in the cervical, axillary or inguinal regions LUNGS: clear to auscultation and  No wheeze or crackles HEART/CVS: regular rate & rhythm and no murmurs; No lower extremity edema ABDOMEN:abdomen soft, non-tender and normal bowel sounds Musculoskeletal:no cyanosis of digits and no clubbing  PSYCH: alert & oriented x 3 with fluent speech NEURO: no focal motor/sensory deficits SKIN:  no rashes or significant lesions Right and left BREAST exam [in the presence of nurse]- no unusual skin changes or dominant masses felt. Surgical scars noted.   LABORATORY DATA:  I have reviewed the data as listed    Component Value Date/Time   NA 138 11/16/2015 1120   NA 141 08/18/2015 1103   K 4.2  11/16/2015 1120   CL 101 11/16/2015 1120   CO2 28 11/16/2015 1120   GLUCOSE 113 (H) 11/16/2015 1120   BUN 19 11/16/2015 1120   BUN 16 08/18/2015 1103   CREATININE 0.69 11/16/2015 1120   CREATININE 0.85 11/04/2013 1029   CALCIUM 8.7 (L) 11/16/2015 1120   PROT 7.0 11/16/2015 1120   PROT 6.9 11/04/2013 1029   ALBUMIN 4.5 11/16/2015 1120   ALBUMIN 4.8 08/18/2015 1103   ALBUMIN 3.9 11/04/2013 1029   AST 15 11/16/2015 1120   AST 9 (L) 11/04/2013 1029   ALT 15 11/16/2015 1120   ALT 29 11/04/2013 1029   ALKPHOS 60 11/16/2015 1120   ALKPHOS 77  11/04/2013 1029   BILITOT 0.5 11/16/2015 1120   BILITOT 0.3 11/04/2013 1029   GFRNONAA >60 11/16/2015 1120   GFRNONAA >60 11/04/2013 1029   GFRNONAA >60 11/03/2012 1106   GFRAA >60 11/16/2015 1120   GFRAA >60 11/04/2013 1029   GFRAA >60 11/03/2012 1106    No results found for: SPEP, UPEP  Lab Results  Component Value Date   WBC 5.2 11/16/2015   NEUTROABS 2.6 11/16/2015   HGB 13.0 11/16/2015   HCT 38.2 11/16/2015   MCV 91.3 11/16/2015   PLT 201 11/16/2015      Chemistry      Component Value Date/Time   NA 138 11/16/2015 1120   NA 141 08/18/2015 1103   K 4.2 11/16/2015 1120   CL 101 11/16/2015 1120   CO2 28 11/16/2015 1120   BUN 19 11/16/2015 1120   BUN 16 08/18/2015 1103   CREATININE 0.69 11/16/2015 1120   CREATININE 0.85 11/04/2013 1029   GLU 101 07/01/2013      Component Value Date/Time   CALCIUM 8.7 (L) 11/16/2015 1120   ALKPHOS 60 11/16/2015 1120   ALKPHOS 77 11/04/2013 1029   AST 15 11/16/2015 1120   AST 9 (L) 11/04/2013 1029   ALT 15 11/16/2015 1120   ALT 29 11/04/2013 1029   BILITOT 0.5 11/16/2015 1120   BILITOT 0.3 11/04/2013 1029       RADIOGRAPHIC STUDIES: I have personally reviewed the radiological images as listed and agreed with the findings in the report. No results found.   ASSESSMENT & PLAN:  Carcinoma of overlapping sites of right breast in female, estrogen receptor positive ([place]) # Stage I  right-sided breast cancer on adjuvant Aromasin; plan until May 2018. Clinically no evidence recurrence. Patient tolerating Aromasin very well.  # Abnormal mammogram/calcifications noted in the right breast in March 2017; we'll need repeat diagnostic mammogram in 6 months/now.  # Follow-up in May 2018 or sooner based on the results of the mammogram.    No orders of the defined types were placed in this encounter.  All questions were answered. The patient knows to call the clinic with any problems, questions or concerns.      Cammie Sickle, MD 11/16/2015 12:20 PM

## 2015-11-16 NOTE — Assessment & Plan Note (Addendum)
#   Stage I right-sided breast cancer on adjuvant Aromasin; plan until May 2018. Clinically no evidence recurrence. Patient tolerating Aromasin very well.  # Abnormal mammogram/calcifications noted in the right breast in March 2017; we'll need repeat diagnostic mammogram in 6 months/now.  # Follow-up in May 2018 or sooner based on the results of the mammogram.

## 2015-12-01 ENCOUNTER — Other Ambulatory Visit: Payer: Self-pay | Admitting: Family Medicine

## 2015-12-01 DIAGNOSIS — E039 Hypothyroidism, unspecified: Secondary | ICD-10-CM

## 2015-12-03 ENCOUNTER — Other Ambulatory Visit: Payer: Self-pay | Admitting: Family Medicine

## 2015-12-03 DIAGNOSIS — E039 Hypothyroidism, unspecified: Secondary | ICD-10-CM

## 2015-12-06 ENCOUNTER — Other Ambulatory Visit: Payer: Self-pay | Admitting: Internal Medicine

## 2015-12-06 ENCOUNTER — Ambulatory Visit
Admission: RE | Admit: 2015-12-06 | Discharge: 2015-12-06 | Disposition: A | Discharge status: Home / Self Care | Payer: Medicare Other | Source: Ambulatory Visit | Attending: Internal Medicine | Admitting: Internal Medicine

## 2015-12-06 DIAGNOSIS — Z853 Personal history of malignant neoplasm of breast: Secondary | ICD-10-CM | POA: Insufficient documentation

## 2015-12-06 DIAGNOSIS — Z9889 Other specified postprocedural states: Secondary | ICD-10-CM | POA: Insufficient documentation

## 2015-12-06 DIAGNOSIS — Z170 Estrogen receptor positive status [ER+]: Secondary | ICD-10-CM | POA: Diagnosis present

## 2015-12-06 DIAGNOSIS — C50811 Malignant neoplasm of overlapping sites of right female breast: Secondary | ICD-10-CM

## 2015-12-19 ENCOUNTER — Ambulatory Visit (INDEPENDENT_AMBULATORY_CARE_PROVIDER_SITE_OTHER): Payer: Medicare Other | Admitting: Family Medicine

## 2015-12-19 ENCOUNTER — Encounter: Payer: Self-pay | Admitting: Family Medicine

## 2015-12-19 ENCOUNTER — Other Ambulatory Visit: Payer: Self-pay | Admitting: *Deleted

## 2015-12-19 VITALS — BP 124/74 | HR 65 | Temp 98.0°F | Resp 16 | Wt 146.0 lb

## 2015-12-19 DIAGNOSIS — E785 Hyperlipidemia, unspecified: Secondary | ICD-10-CM | POA: Diagnosis not present

## 2015-12-19 MED ORDER — PRAVASTATIN SODIUM 40 MG PO TABS: 40.0000 mg | ORAL_TABLET | Freq: Every day | ORAL | 4 refills | Status: DC

## 2015-12-19 NOTE — Progress Notes (Signed)
Patient: Rebecca Patterson Female    DOB: 23-Dec-1934   81 y.o.   MRN: [id number] Visit Date: 12/19/2015  Today's Provider: Lelon Huh, MD   Chief Complaint  Patient presents with  . Hyperlipidemia    follow up  . Hypothyroidism    follow up   Subjective:    HPI  Lipid/Cholesterol, Follow-up:   Last seen for this4 months ago.  Management changes since that visit include starting Pravastatin due to elevated cholesterol. Patient was also advised to start taking Aspirin 81mg  daily. . . Last Lipid Panel:    Component Value Date/Time   CHOL 204 (H) 08/18/2015 1103   TRIG 220 (H) 08/18/2015 1103   HDL 53 08/18/2015 1103   CHOLHDL 3.8 08/18/2015 1103   LDLCALC 107 (H) 08/18/2015 1103    Risk factors for vascular disease include hypercholesterolemia  She reports good compliance with treatment. She is not having side effects.  Current symptoms include none and have been stable. Weight trend: stable Prior visit with dietician: no Current diet: well balanced Current exercise: walking  Wt Readings from Last 3 Encounters:  11/16/15 146 lb 6.4 oz (66.4 kg)  08/18/15 146 lb (66.2 kg)  02/18/15 145 lb (65.8 kg)    ------------------------------------------------------------------- Follow up Hypothyroidism:  Patient was last seen for this problem 4 months ago and no changes were made. Patient reports good compliance with treatment and good tolerance.     Allergies  Allergen Reactions  . Fosamax [Alendronate]     Patient reports medication caused her to severe mood changes.     Current Outpatient Prescriptions:  .  aspirin EC 81 MG tablet, Take 81 mg by mouth daily., Disp: , Rfl:  .  calcium carbonate (OS-CAL) 600 MG TABS tablet, Take 600 mg by mouth daily with breakfast. , Disp: , Rfl:  .  exemestane (AROMASIN) 25 MG tablet, Take 1 tablet (25 mg total) by mouth daily after breakfast., Disp: 90 tablet, Rfl: 1 .  famotidine (PEPCID) 20 MG tablet, Take 20 mg by  mouth as needed. , Disp: , Rfl:  .  ketoconazole (NIZORAL) 2 % cream, , Disp: , Rfl:  .  levothyroxine (SYNTHROID, LEVOTHROID) 50 MCG tablet, TAKE 1 TABLET BY MOUTH DAILY, Disp: 90 tablet, Rfl: 2 .  Multiple Vitamins-Minerals (PRESERVISION AREDS 2 PO), Take 1 tablet by mouth 2 (two) times daily. , Disp: , Rfl:  .  nystatin cream (MYCOSTATIN), APPLY TO AFFECTED AREA TWICE A DAY AS NEEDED, Disp: 60 g, Rfl: 2 .  Omega-3 Fatty Acids (FISH OIL DOUBLE STRENGTH) 1200 MG CAPS, Take 1 capsule by mouth daily. , Disp: , Rfl:  .  pravastatin (PRAVACHOL) 40 MG tablet, TAKE 1 TABLET (40 MG TOTAL) BY MOUTH DAILY., Disp: 30 tablet, Rfl: 6 .  triamcinolone cream (KENALOG) 0.1 %, Apply 1 application topically 3 (three) times daily. For insect bites., Disp: 60 g, Rfl: 0  Review of Systems  Constitutional: Negative for appetite change, chills, fatigue and fever.  Respiratory: Negative for chest tightness and shortness of breath.   Cardiovascular: Negative for chest pain and palpitations.  Gastrointestinal: Negative for abdominal pain, nausea and vomiting.  Neurological: Positive for tremors. Negative for dizziness and weakness.    Social History  Substance Use Topics  . Smoking status: Never Smoker  . Smokeless tobacco: Never Used  . Alcohol use No   Objective:   BP 124/74 (BP Location: Left Arm, Patient Position: Sitting, Cuff Size: Normal)   Pulse 65  Temp 98 F (36.7 C) (Oral)   Resp 16   Wt 146 lb (66.2 kg)   SpO2 96%   BMI 28.51 kg/m   Physical Exam  General appearance: alert, well developed, well nourished, cooperative and in no distress Head: Normocephalic, without obvious abnormality, atraumatic Respiratory: Respirations even and unlabored, normal respiratory rate      Assessment & Plan:     1. Hyperlipidemia, unspecified hyperlipidemia type She is tolerating pravastatin well with no adverse effects.   - Lipid panel - Hepatic function panel      The entirety of the information  documented in the History of Present Illness, Review of Systems and Physical Exam were personally obtained by me. Portions of this information were initially documented by Meyer Cory, CMA and reviewed by me for thoroughness and accuracy.    Lelon Huh, MD  [facility]

## 2015-12-19 NOTE — Telephone Encounter (Signed)
Requesting 90 day supply.

## 2015-12-20 ENCOUNTER — Telehealth: Payer: Self-pay

## 2015-12-20 LAB — HEPATIC FUNCTION PANEL
ALBUMIN: 4.7 g/dL (ref 3.5–4.8)
ALK PHOS: 68 IU/L (ref 39–117)
ALT: 13 IU/L (ref 0–32)
AST: 15 IU/L (ref 0–40)
Bilirubin Total: 0.6 mg/dL (ref 0.0–1.2)
Bilirubin, Direct: 0.18 mg/dL (ref 0.00–0.40)
TOTAL PROTEIN: 6.8 g/dL (ref 6.0–8.5)

## 2015-12-20 LAB — LIPID PANEL
Chol/HDL Ratio: 2.7 ratio units (ref 0.0–4.4)
Cholesterol, Total: 147 mg/dL (ref 100–199)
HDL: 55 mg/dL (ref >39)
LDL CALC: 62 mg/dL (ref 0–99)
Triglycerides: 150 mg/dL — ABNORMAL HIGH (ref 0–149)
VLDL CHOLESTEROL CAL: 30 mg/dL (ref 5–40)

## 2015-12-20 NOTE — Telephone Encounter (Signed)
Patient has been advised. KW 

## 2015-12-20 NOTE — Telephone Encounter (Signed)
-----   Message from Birdie Sons, MD sent at 12/20/2015  8:01 AM EST ----- Cholesterol much better, down from 204 to 147. Continue current dose of pravastatin and other meds.

## 2015-12-20 NOTE — Telephone Encounter (Deleted)
-----   Message from Birdie Sons, MD sent at 12/20/2015  8:01 AM EST ----- Cholesterol much better, down from 204 to 147. Continue current dose of pravastatin and other meds.

## 2016-02-25 ENCOUNTER — Other Ambulatory Visit: Payer: Self-pay | Admitting: Family Medicine

## 2016-02-25 DIAGNOSIS — W57XXXA Bitten or stung by nonvenomous insect and other nonvenomous arthropods, initial encounter: Secondary | ICD-10-CM

## 2016-02-27 NOTE — Telephone Encounter (Signed)
See refill request.

## 2016-02-29 DIAGNOSIS — Z859 Personal history of malignant neoplasm, unspecified: Secondary | ICD-10-CM | POA: Diagnosis not present

## 2016-02-29 DIAGNOSIS — Z85828 Personal history of other malignant neoplasm of skin: Secondary | ICD-10-CM | POA: Diagnosis not present

## 2016-02-29 DIAGNOSIS — L570 Actinic keratosis: Secondary | ICD-10-CM | POA: Diagnosis not present

## 2016-02-29 DIAGNOSIS — L821 Other seborrheic keratosis: Secondary | ICD-10-CM | POA: Diagnosis not present

## 2016-05-05 ENCOUNTER — Other Ambulatory Visit: Payer: Self-pay | Admitting: Internal Medicine

## 2016-05-18 DIAGNOSIS — H353132 Nonexudative age-related macular degeneration, bilateral, intermediate dry stage: Secondary | ICD-10-CM | POA: Diagnosis not present

## 2016-05-29 ENCOUNTER — Telehealth: Payer: Self-pay | Admitting: Family Medicine

## 2016-05-29 NOTE — Telephone Encounter (Signed)
Called Pt to schedule AWV with [facility] - knb °

## 2016-06-03 ENCOUNTER — Other Ambulatory Visit: Payer: Self-pay | Admitting: Internal Medicine

## 2016-06-12 ENCOUNTER — Other Ambulatory Visit: Payer: Self-pay | Admitting: *Deleted

## 2016-06-12 DIAGNOSIS — C50811 Malignant neoplasm of overlapping sites of right female breast: Secondary | ICD-10-CM

## 2016-06-12 DIAGNOSIS — Z170 Estrogen receptor positive status [ER+]: Principal | ICD-10-CM

## 2016-06-15 ENCOUNTER — Inpatient Hospital Stay (HOSPITAL_BASED_OUTPATIENT_CLINIC_OR_DEPARTMENT_OTHER): Payer: Medicare Other | Admitting: Internal Medicine

## 2016-06-15 ENCOUNTER — Inpatient Hospital Stay: Payer: Medicare Other | Attending: Internal Medicine

## 2016-06-15 VITALS — BP 167/82 | HR 62 | Temp 98.3°F | Resp 18 | Ht 60.0 in | Wt 142.4 lb

## 2016-06-15 DIAGNOSIS — K219 Gastro-esophageal reflux disease without esophagitis: Secondary | ICD-10-CM | POA: Diagnosis not present

## 2016-06-15 DIAGNOSIS — Z7982 Long term (current) use of aspirin: Secondary | ICD-10-CM | POA: Diagnosis not present

## 2016-06-15 DIAGNOSIS — Z79811 Long term (current) use of aromatase inhibitors: Secondary | ICD-10-CM

## 2016-06-15 DIAGNOSIS — E785 Hyperlipidemia, unspecified: Secondary | ICD-10-CM | POA: Insufficient documentation

## 2016-06-15 DIAGNOSIS — Z85828 Personal history of other malignant neoplasm of skin: Secondary | ICD-10-CM

## 2016-06-15 DIAGNOSIS — Z170 Estrogen receptor positive status [ER+]: Secondary | ICD-10-CM | POA: Insufficient documentation

## 2016-06-15 DIAGNOSIS — C50811 Malignant neoplasm of overlapping sites of right female breast: Secondary | ICD-10-CM

## 2016-06-15 DIAGNOSIS — Z79899 Other long term (current) drug therapy: Secondary | ICD-10-CM

## 2016-06-15 DIAGNOSIS — R928 Other abnormal and inconclusive findings on diagnostic imaging of breast: Secondary | ICD-10-CM | POA: Insufficient documentation

## 2016-06-15 DIAGNOSIS — Z923 Personal history of irradiation: Secondary | ICD-10-CM | POA: Diagnosis not present

## 2016-06-15 DIAGNOSIS — E079 Disorder of thyroid, unspecified: Secondary | ICD-10-CM | POA: Insufficient documentation

## 2016-06-15 DIAGNOSIS — E119 Type 2 diabetes mellitus without complications: Secondary | ICD-10-CM

## 2016-06-15 LAB — CBC WITH DIFFERENTIAL/PLATELET
Basophils Absolute: 0 10*3/uL (ref 0–0.1)
Basophils Relative: 1 %
Eosinophils Absolute: 0 10*3/uL (ref 0–0.7)
Eosinophils Relative: 1 %
HCT: 37.9 % (ref 35.0–47.0)
Hemoglobin: 12.9 g/dL (ref 12.0–16.0)
Lymphocytes Relative: 43 %
Lymphs Abs: 2 10*3/uL (ref 1.0–3.6)
MCH: 31.3 pg (ref 26.0–34.0)
MCHC: 34.1 g/dL (ref 32.0–36.0)
MCV: 91.9 fL (ref 80.0–100.0)
Monocytes Absolute: 0.4 10*3/uL (ref 0.2–0.9)
Monocytes Relative: 8 %
Neutro Abs: 2.2 10*3/uL (ref 1.4–6.5)
Neutrophils Relative %: 47 %
Platelets: 224 10*3/uL (ref 150–440)
RBC: 4.12 MIL/uL (ref 3.80–5.20)
RDW: 14 % (ref 11.5–14.5)
WBC: 4.6 10*3/uL (ref 3.6–11.0)

## 2016-06-15 LAB — COMPREHENSIVE METABOLIC PANEL WITH GFR
ALT: 16 U/L (ref 14–54)
AST: 18 U/L (ref 15–41)
Albumin: 4.6 g/dL (ref 3.5–5.0)
Alkaline Phosphatase: 56 U/L (ref 38–126)
Anion gap: 8 (ref 5–15)
BUN: 14 mg/dL (ref 6–20)
CO2: 30 mmol/L (ref 22–32)
Calcium: 9.3 mg/dL (ref 8.9–10.3)
Chloride: 103 mmol/L (ref 101–111)
Creatinine, Ser: 0.8 mg/dL (ref 0.44–1.00)
GFR calc Af Amer: >60 mL/min
GFR calc non Af Amer: >60 mL/min
Glucose, Bld: 108 mg/dL — ABNORMAL HIGH (ref 65–99)
Potassium: 4.2 mmol/L (ref 3.5–5.1)
Sodium: 141 mmol/L (ref 135–145)
Total Bilirubin: 0.6 mg/dL (ref 0.3–1.2)
Total Protein: 7.1 g/dL (ref 6.5–8.1)

## 2016-06-15 NOTE — Progress Notes (Signed)
[place] OFFICE PROGRESS NOTE  Patient Care Team: Birdie Sons, MD as PCP - General (Family Medicine) Leandrew Koyanagi, MD as Referring Physician (Ophthalmology) Leia Alf (Oncology) Berton Mount (Radiation Oncology)  Cancer Staging No matching staging information was found for the patient.   Oncology History   # 2013- Stage IA (pT1b pN0 (sn) cM0) grade 1 invasive mammary carcinoma of the right breast status post wide local excision and sentinel node biopsy on 04/05/11.; Tumor size 9 mm, grade 1, margins uninvolved by invasive carcinoma.  No DCIS.  1 sentinel lymph node negative;  ER/PR positive (90%),  HER-2/neu negative by FISH (HER2/CEP17 ratio 1.04). S/p RT; no chemo.   # Patient started anastrozole March 2013, changed to exemestane May 2013 due to side effects [until May 2018]     Carcinoma of overlapping sites of right breast in female, estrogen receptor positive ([place])      INTERVAL HISTORY:  Rebecca Patterson 82 y.o.  female pleasant patient above history of Breast cancer right-sided on exemestane is here for follow-up.  Patient denies any lumps or bumps. Her appetite is good. No bone pain. No nausea no vomiting. Patient denies any hot flashes. Denies any unusual bone pain or joint pains. Interested in coming off her antihormone therapy at this time.  Patient had a mammogram in Oct 2017 found to have dystrophic calcifications and recommended a mammogram in 6 months. She has not had mammogram as recommended yet  REVIEW OF SYSTEMS:  A complete 10 point review of system is done which is negative except mentioned above/history of present illness.   PAST MEDICAL HISTORY :  Past Medical History:  Diagnosis Date  . Allergy   . Breast cancer ([place]) 2013   Rt  . Cancer [facility]) 2013   Right Breast- radiation and Tamoxifen  . Complication of anesthesia   . Diabetes mellitus without complication ([place])    pt denies - no meds  . Family history of adverse  reaction to anesthesia    sister - slow to wake  . Fibrocystic breast   . GERD (gastroesophageal reflux disease)   . Hyperlipidemia   . Osteoporosis   . Osteoporosis   . PONV (postoperative nausea and vomiting)   . Thyroid disease   . Vertigo    long ago  . Wears dentures    full upper and lower  . Wears hearing aid    bilateral    PAST SURGICAL HISTORY :   Past Surgical History:  Procedure Laterality Date  . ABDOMINAL HYSTERECTOMY    . BREAST BIOPSY Right 2013   Stereo- Positive  . BREAST EXCISIONAL BIOPSY Right 2013  . BUNIONECTOMY Bilateral 2004  . 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 CATARACT EXTRACTION W/PHACO Right 11/10/2014   Procedure: CATARACT EXTRACTION PHACO AND INTRAOCULAR LENS PLACEMENT (IOC);  Surgeon: Leandrew Koyanagi, MD;  Location: [facility];  Service: Ophthalmology;  Laterality: Right;  . CHOLECYSTECTOMY    . KNEE SURGERY Right 2005  . Mastoid surgery Right   . SKIN CANCER EXCISION  03/17/2014   right side of face Dr. Aubery Lapping  . THYROID SURGERY N/A 1980   Goiter    FAMILY HISTORY :   Family History  Problem Relation Age of Onset  . Stroke Sister   . Breast cancer Sister 33  . Cancer Sister        breast     SOCIAL HISTORY:   Social History  Substance Use Topics  . Smoking  status: Never Smoker  . Smokeless tobacco: Never Used  . Alcohol use No    ALLERGIES:  is allergic to fosamax [alendronate].  MEDICATIONS:  Current Outpatient Prescriptions  Medication Sig Dispense Refill  . aspirin EC 81 MG tablet Take 81 mg by mouth daily.    . calcium carbonate (OS-CAL) 600 MG TABS tablet Take 600 mg by mouth daily with breakfast.     . exemestane (AROMASIN) 25 MG tablet TAKE 1 TABLET BY MOUTH EVERY DAY 30 tablet 0  . famotidine (PEPCID) 20 MG tablet Take 20 mg by mouth as needed.     Marland Kitchen levothyroxine (SYNTHROID, LEVOTHROID) 50 MCG tablet TAKE 1 TABLET BY MOUTH DAILY 90 tablet 2  . Multiple Vitamins-Minerals  (PRESERVISION AREDS 2 PO) Take 1 tablet by mouth 2 (two) times daily.     . Omega-3 Fatty Acids (FISH OIL DOUBLE STRENGTH) 1200 MG CAPS Take 1 capsule by mouth daily.     . pravastatin (PRAVACHOL) 40 MG tablet Take 1 tablet (40 mg total) by mouth daily. 90 tablet 4  . ketoconazole (NIZORAL) 2 % cream Apply 1 application topically daily as needed.     . nystatin cream (MYCOSTATIN) APPLY TO AFFECTED AREA TWICE A DAY AS NEEDED (Patient not taking: Reported on 06/15/2016) 60 g 2  . triamcinolone cream (KENALOG) 0.1 % APPLY TO AFFECTED AREA 3 TIMES A DAY FOR INSECT BITES (Patient not taking: Reported on 06/15/2016) 60 g 5   No current facility-administered medications for this visit.     PHYSICAL EXAMINATION: ECOG PERFORMANCE STATUS: 0 - Asymptomatic  BP (!) 167/82 (Patient Position: Sitting)   Pulse 62   Temp 98.3 F (36.8 C) (Tympanic)   Resp 18   Ht 5' (1.524 m)   Wt 142 lb 6.4 oz (64.6 kg)   BMI 27.81 kg/m   Filed Weights   06/15/16 1150  Weight: 142 lb 6.4 oz (64.6 kg)    GENERAL: Well-nourished well-developed; Alert, no distress and comfortable.   Alone.  EYES: no pallor or icterus OROPHARYNX: no thrush or ulceration; good dentition  NECK: supple, no masses felt LYMPH:  no palpable lymphadenopathy in the cervical, axillary or inguinal regions LUNGS: clear to auscultation and  No wheeze or crackles HEART/CVS: regular rate & rhythm and no murmurs; No lower extremity edema ABDOMEN:abdomen soft, non-tender and normal bowel sounds Musculoskeletal:no cyanosis of digits and no clubbing  PSYCH: alert & oriented x 3 with fluent speech NEURO: no focal motor/sensory deficits SKIN:  no rashes or significant lesions Right and left BREAST exam [in the presence of nurse]- no unusual skin changes or dominant masses felt. Surgical scars noted.   LABORATORY DATA:  I have reviewed the data as listed    Component Value Date/Time   NA 141 06/15/2016 1105   NA 141 08/18/2015 1103   K 4.2  06/15/2016 1105   CL 103 06/15/2016 1105   CO2 30 06/15/2016 1105   GLUCOSE 108 (H) 06/15/2016 1105   BUN 14 06/15/2016 1105   BUN 16 08/18/2015 1103   CREATININE 0.80 06/15/2016 1105   CREATININE 0.85 11/04/2013 1029   CALCIUM 9.3 06/15/2016 1105   PROT 7.1 06/15/2016 1105   PROT 6.8 12/19/2015 1205   PROT 6.9 11/04/2013 1029   ALBUMIN 4.6 06/15/2016 1105   ALBUMIN 4.7 12/19/2015 1205   ALBUMIN 3.9 11/04/2013 1029   AST 18 06/15/2016 1105   AST 9 (L) 11/04/2013 1029   ALT 16 06/15/2016 1105   ALT 29 11/04/2013 1029  ALKPHOS 56 06/15/2016 1105   ALKPHOS 77 11/04/2013 1029   BILITOT 0.6 06/15/2016 1105   BILITOT 0.6 12/19/2015 1205   BILITOT 0.3 11/04/2013 1029   GFRNONAA >60 06/15/2016 1105   GFRNONAA >60 11/04/2013 1029   GFRNONAA >60 11/03/2012 1106   GFRAA >60 06/15/2016 1105   GFRAA >60 11/04/2013 1029   GFRAA >60 11/03/2012 1106    No results found for: SPEP, UPEP  Lab Results  Component Value Date   WBC 4.6 06/15/2016   NEUTROABS 2.2 06/15/2016   HGB 12.9 06/15/2016   HCT 37.9 06/15/2016   MCV 91.9 06/15/2016   PLT 224 06/15/2016      Chemistry      Component Value Date/Time   NA 141 06/15/2016 1105   NA 141 08/18/2015 1103   K 4.2 06/15/2016 1105   CL 103 06/15/2016 1105   CO2 30 06/15/2016 1105   BUN 14 06/15/2016 1105   BUN 16 08/18/2015 1103   CREATININE 0.80 06/15/2016 1105   CREATININE 0.85 11/04/2013 1029   GLU 101 07/01/2013      Component Value Date/Time   CALCIUM 9.3 06/15/2016 1105   ALKPHOS 56 06/15/2016 1105   ALKPHOS 77 11/04/2013 1029   AST 18 06/15/2016 1105   AST 9 (L) 11/04/2013 1029   ALT 16 06/15/2016 1105   ALT 29 11/04/2013 1029   BILITOT 0.6 06/15/2016 1105   BILITOT 0.6 12/19/2015 1205   BILITOT 0.3 11/04/2013 1029       RADIOGRAPHIC STUDIES: I have personally reviewed the radiological images as listed and agreed with the findings in the report. No results found.   ASSESSMENT & PLAN:  Carcinoma of overlapping  sites of right breast in female, estrogen receptor positive ([place]) # Stage I right-sided breast cancer on adjuvant Aromasin; plan until May 2018. Clinically no evidence recurrence. Patient tolerating Aromasin very well. Stop aromasin now.   # Abnormal mammogram/calcifications noted in the right breast in Oct 2017; we'll need repeat diagnostic mammogram in 6 months/now.  # BMD jan 2017- osteopenia- -2.1. We will need repeat next year.  # Follow-up in May 2018 or sooner based on the results of the mammogram.   # 12 months follow up/labs.   Orders Placed This Encounter  Procedures  . MM DIAG BREAST TOMO UNI RIGHT    Standing Status:   Future    Standing Expiration Date:   08/15/2017    Order Specific Question:   Reason for Exam (SYMPTOM  OR DIAGNOSIS REQUIRED)    Answer:   History of treated right breast cancer    Order Specific Question:   Preferred imaging location?    Answer:   [facility]  . US Breast Limited Uni Right Inc Axilla    Standing Status:   Future    Standing Expiration Date:   08/15/2017    Order Specific Question:   Reason for Exam (SYMPTOM  OR DIAGNOSIS REQUIRED)    Answer:   history of right breast cancer    Order Specific Question:   Preferred imaging location?    [facility]  . Comprehensive metabolic panel    Standing Status:   Future    Standing Expiration Date:   12/16/2017  . CBC with Differential    Standing Status:   Future    Standing Expiration Date:   12/16/2017   All questions were answered. The patient knows to call the clinic with any problems, questions or concerns.  Cammie Sickle, MD 06/15/2016 6:16 PM

## 2016-06-15 NOTE — Assessment & Plan Note (Addendum)
#   Stage I right-sided breast cancer on adjuvant Aromasin; plan until May 2018. Clinically no evidence recurrence. Patient tolerating Aromasin very well. Stop aromasin now.   # Abnormal mammogram/calcifications noted in the right breast in Oct 2017; we'll need repeat diagnostic mammogram in 6 months/now.  # BMD jan 2017- osteopenia- -2.1. We will need repeat next year.  # Follow-up in May 2018 or sooner based on the results of the mammogram.   # 12 months follow up/labs.

## 2016-06-27 ENCOUNTER — Other Ambulatory Visit: Payer: Self-pay | Admitting: *Deleted

## 2016-06-27 DIAGNOSIS — Z170 Estrogen receptor positive status [ER+]: Principal | ICD-10-CM

## 2016-06-27 DIAGNOSIS — C50811 Malignant neoplasm of overlapping sites of right female breast: Secondary | ICD-10-CM

## 2016-07-07 ENCOUNTER — Other Ambulatory Visit: Payer: Self-pay | Admitting: Internal Medicine

## 2016-07-17 ENCOUNTER — Ambulatory Visit: Payer: Self-pay | Admitting: Podiatry

## 2016-07-24 ENCOUNTER — Ambulatory Visit (INDEPENDENT_AMBULATORY_CARE_PROVIDER_SITE_OTHER): Payer: Medicare Other | Admitting: Podiatry

## 2016-07-24 DIAGNOSIS — L84 Corns and callosities: Secondary | ICD-10-CM | POA: Diagnosis not present

## 2016-07-28 NOTE — Progress Notes (Signed)
   Subjective: Patient presents to the office today for chief complaint of progressively worsening painful callus lesions between the great and second toes of bilateral feet, right greater than left, that has been present for the past 3-4 months. Walking and wearing shoes increase the pain. She reports using cortisone cream to the affected areas which has provided some relief. Patient presents today for further treatment and evaluation.  Objective:  Physical Exam General: Alert and oriented x3 in no acute distress  Dermatology: Hyperkeratotic lesion present on the bilateral second toes. Pain on palpation with a central nucleated core noted.  Skin is warm, dry and supple bilateral lower extremities. Negative for open lesions or macerations.  Vascular: Palpable pedal pulses bilaterally. No edema or erythema noted. Capillary refill within normal limits.  Neurological: Epicritic and protective threshold grossly intact bilaterally.   Musculoskeletal Exam: Pain on palpation at the keratotic lesion noted. Range of motion within normal limits bilateral. Muscle strength 5/5 in all groups bilateral.  Assessment: #1 corns bilateral second toes   Plan of Care:  #1 Patient evaluated #2 Excisional debridement of keratoic lesion using a chisel blade was performed without incident.  #3 Treated area(s) with Salinocaine and dressed with light dressing. #4 Patient is to return to the clinic PRN.   Edrick Kins, DPM [facility]  Dr. Edrick Kins, [address]                                        [place], [place] [address]                Office [phone]  Fax [phone]

## 2016-07-30 ENCOUNTER — Ambulatory Visit
Admission: RE | Admit: 2016-07-30 | Discharge: 2016-07-30 | Disposition: A | Discharge status: Home / Self Care | Payer: Medicare Other | Source: Ambulatory Visit | Attending: Internal Medicine | Admitting: Internal Medicine

## 2016-07-30 DIAGNOSIS — Z170 Estrogen receptor positive status [ER+]: Principal | ICD-10-CM

## 2016-07-30 DIAGNOSIS — Z853 Personal history of malignant neoplasm of breast: Secondary | ICD-10-CM | POA: Insufficient documentation

## 2016-07-30 DIAGNOSIS — R922 Inconclusive mammogram: Secondary | ICD-10-CM | POA: Diagnosis not present

## 2016-07-30 DIAGNOSIS — C50811 Malignant neoplasm of overlapping sites of right female breast: Secondary | ICD-10-CM | POA: Diagnosis present

## 2016-07-30 DIAGNOSIS — Z08 Encounter for follow-up examination after completed treatment for malignant neoplasm: Secondary | ICD-10-CM | POA: Diagnosis not present

## 2016-07-30 DIAGNOSIS — Z9889 Other specified postprocedural states: Secondary | ICD-10-CM | POA: Diagnosis not present

## 2016-08-21 ENCOUNTER — Encounter: Payer: Medicare Other | Admitting: Family Medicine

## 2016-08-29 ENCOUNTER — Telehealth: Payer: Self-pay | Admitting: Family Medicine

## 2016-08-29 ENCOUNTER — Ambulatory Visit (INDEPENDENT_AMBULATORY_CARE_PROVIDER_SITE_OTHER): Payer: Medicare Other

## 2016-08-29 ENCOUNTER — Ambulatory Visit (INDEPENDENT_AMBULATORY_CARE_PROVIDER_SITE_OTHER): Payer: Medicare Other | Admitting: Family Medicine

## 2016-08-29 VITALS — BP 132/80 | HR 74 | Temp 98.9°F | Ht 60.0 in | Wt 145.8 lb

## 2016-08-29 DIAGNOSIS — M818 Other osteoporosis without current pathological fracture: Secondary | ICD-10-CM

## 2016-08-29 DIAGNOSIS — R1310 Dysphagia, unspecified: Secondary | ICD-10-CM | POA: Diagnosis not present

## 2016-08-29 DIAGNOSIS — R29898 Other symptoms and signs involving the musculoskeletal system: Secondary | ICD-10-CM

## 2016-08-29 DIAGNOSIS — R7303 Prediabetes: Secondary | ICD-10-CM | POA: Diagnosis not present

## 2016-08-29 DIAGNOSIS — E039 Hypothyroidism, unspecified: Secondary | ICD-10-CM | POA: Diagnosis not present

## 2016-08-29 DIAGNOSIS — Z0000 Encounter for general adult medical examination without abnormal findings: Secondary | ICD-10-CM

## 2016-08-29 DIAGNOSIS — E785 Hyperlipidemia, unspecified: Secondary | ICD-10-CM | POA: Diagnosis not present

## 2016-08-29 DIAGNOSIS — E559 Vitamin D deficiency, unspecified: Secondary | ICD-10-CM

## 2016-08-29 DIAGNOSIS — T50905A Adverse effect of unspecified drugs, medicaments and biological substances, initial encounter: Secondary | ICD-10-CM | POA: Diagnosis not present

## 2016-08-29 NOTE — Progress Notes (Signed)
Subjective:   Rebecca Patterson is a 82 y.o. female who presents for Medicare Annual (Subsequent) preventive examination.  Review of Systems:  N/A  Cardiac Risk Factors include: advanced age (>58men, >20 women);dyslipidemia     Objective:     Vitals: BP 132/80 (BP Location: Left Arm)   Pulse 74   Temp 98.9 F (37.2 C) (Oral)   Ht 5' (1.524 m)   Wt 145 lb 12.8 oz (66.1 kg)   BMI 28.47 kg/m   Body mass index is 28.47 kg/m.   Tobacco History  Smoking Status  . Never Smoker  Smokeless Tobacco  . Never Used     Counseling given: Not Answered   Past Medical History:  Diagnosis Date  . Allergy   . Breast cancer ([place]) 2013   Rt  . Cancer [facility]) 2013   Right Breast- radiation and Tamoxifen  . Complication of anesthesia   . [place]   . Diabetes mellitus without complication ([place])    pt denies - no meds  . Family history of adverse reaction to anesthesia    sister - slow to wake  . Fibrocystic breast   . GERD (gastroesophageal reflux disease)   . Hyperlipidemia   . Osteoporosis   . Osteoporosis   . PONV (postoperative nausea and vomiting)   . Thyroid disease   . Vertigo    long ago  . Wears dentures    full upper and lower  . Wears hearing aid    bilateral   Past Surgical History:  Procedure Laterality Date  . ABDOMINAL HYSTERECTOMY    . BREAST BIOPSY Right 2013   Stereo- Positive  . BREAST EXCISIONAL BIOPSY Right 2013  . BREAST LUMPECTOMY Right    2013  . BUNIONECTOMY Bilateral 2004  . 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 CATARACT EXTRACTION W/PHACO Right 11/10/2014   Procedure: CATARACT EXTRACTION PHACO AND INTRAOCULAR LENS PLACEMENT (IOC);  Surgeon: Leandrew Koyanagi, MD;  Location: [facility];  Service: Ophthalmology;  Laterality: Right;  . CHOLECYSTECTOMY    . KNEE SURGERY Right 2005  . Mastoid surgery Right   . SKIN CANCER EXCISION  03/17/2014   right side of face Dr. Aubery Lapping  . THYROID SURGERY N/A 1980   Goiter    Family History  Problem Relation Age of Onset  . Stroke Sister   . Breast cancer Sister 28  . Cancer Sister        breast   . Pneumonia Other    History  Sexual Activity  . Sexual activity: Not on file    Outpatient Encounter Prescriptions as of 08/29/2016  Medication Sig  . aspirin EC 81 MG tablet Take 81 mg by mouth daily.  . calcium carbonate (OS-CAL) 600 MG TABS tablet Take 600 mg by mouth daily with breakfast.   . docusate sodium (COLACE) 100 MG capsule Take by mouth daily.  . famotidine (PEPCID) 20 MG tablet Take 20 mg by mouth as needed.   Marland Kitchen ketoconazole (NIZORAL) 2 % cream Apply 1 application topically daily as needed.   Marland Kitchen levothyroxine (SYNTHROID, LEVOTHROID) 50 MCG tablet TAKE 1 TABLET BY MOUTH DAILY  . Multiple Vitamins-Minerals (PRESERVISION AREDS 2 PO) Take 1 tablet by mouth 2 (two) times daily.   Marland Kitchen nystatin cream (MYCOSTATIN) APPLY TO AFFECTED AREA TWICE A DAY AS NEEDED  . Omega-3 Fatty Acids (FISH OIL DOUBLE STRENGTH) 1200 MG CAPS Take 1 capsule by mouth daily.   . polyethylene glycol (MIRALAX / GLYCOLAX) packet Take 17  g by mouth daily.  . pravastatin (PRAVACHOL) 40 MG tablet Take 1 tablet (40 mg total) by mouth daily.  Marland Kitchen triamcinolone cream (KENALOG) 0.1 % APPLY TO AFFECTED AREA 3 TIMES A DAY FOR INSECT BITES (Patient not taking: Reported on 06/15/2016)   No facility-administered encounter medications on file as of 08/29/2016.     Activities of Daily Living In your present state of health, do you have any difficulty performing the following activities: 08/29/2016  Hearing? Y  Vision? Y  Difficulty concentrating or making decisions? Y  Walking or climbing stairs? Y  Dressing or bathing? N  Doing errands, shopping? N  Preparing Food and eating ? N  Using the Toilet? N  In the past six months, have you accidently leaked urine? N  Do you have problems with loss of bowel control? N  Managing your Medications? N  Managing your Finances? N  Housekeeping or  managing your Housekeeping? N  Some recent data might be hidden    Patient Care Team: Birdie Sons, MD as PCP - General (Family Medicine) Leandrew Koyanagi, MD as Referring Physician (Ophthalmology) Cammie Sickle, MD as Consulting Physician (Internal Medicine)    Assessment:     Exercise Activities and Dietary recommendations Current Exercise Habits: The patient does not participate in regular exercise at present (gardening), Exercise limited by: None identified  Goals    . Exercise 150 minutes per week (moderate activity)    . water intake          Continue drinking 6-8 glasses of water a day.       Fall Risk Fall Risk  08/29/2016 08/18/2015 08/18/2015 08/17/2014  Falls in the past year? No No No No   Depression Screen PHQ 2/9 Scores 08/29/2016 08/29/2016 08/18/2015 08/18/2015  PHQ - 2 Score 0 0 0 0  PHQ- 9 Score 0 - - -     Cognitive Function     6CIT Screen 08/29/2016  What Year? 0 points  What month? 0 points  What time? 0 points  Count back from 20 0 points  Months in reverse 2 points  Repeat phrase 0 points  Total Score 2    Immunization History  Administered Date(s) Administered  . Influenza, High Dose Seasonal PF 11/11/2015  . Influenza,inj,Quad PF,36+ Mos 02/18/2015  . Influenza-Unspecified 10/06/2013  . Pneumococcal Conjugate-13 10/21/2013  . Pneumococcal Polysaccharide-23 01/20/2010  . Td 09/03/2005   Screening Tests Health Maintenance  Topic Date Due  . FOOT EXAM  01/02/1945  . URINE MICROALBUMIN  01/02/1945  . HEMOGLOBIN A1C  02/18/2016  . TETANUS/TDAP  02/05/2026 (Originally 09/04/2015)  . INFLUENZA VACCINE  09/05/2016  . OPHTHALMOLOGY EXAM  05/06/2017  . DEXA SCAN  Completed  . PNA vac Low Risk Adult  Completed      Plan:  I have personally reviewed and addressed the Medicare Annual Wellness questionnaire and have noted the following in the patient's chart:  A. Medical and social history B. Use of alcohol, tobacco or illicit  drugs  C. Current medications and supplements D. Functional ability and status E.  Nutritional status F.  Physical activity G. Advance directives H. List of other physicians I.  Hospitalizations, surgeries, and ER visits in previous 12 months J.  [place] such as hearing and vision if needed, cognitive and depression L. Referrals and appointments - none  In addition, I have reviewed and discussed with patient certain preventive protocols, quality metrics, and best practice recommendations. A written personalized care plan for  preventive services as well as general preventive health recommendations were provided to patient.  See attached scanned questionnaire for additional information.   Signed,  Fabio Neighbors, LPN Nurse Health Advisor   MD Recommendations: Pt needs a foot exam, urine microalbumin and Hgb A1c checked. Pt declined tetanus vaccine today.

## 2016-08-29 NOTE — Telephone Encounter (Signed)
Please advise 

## 2016-08-29 NOTE — Patient Instructions (Addendum)
   The CDC recommends two doses of Shingrix separated by 2 to 6 months for adults age 80 years and older. I recommend checking with your insurance plan regarding coverage for this vaccine.     . 

## 2016-08-29 NOTE — Telephone Encounter (Signed)
Pt's last bone density was Jan 2017.Do you want bone density scheduled now or wait until 2019.Insurance usually will only pay every 2 years

## 2016-08-29 NOTE — Telephone Encounter (Signed)
It can wait until January. Thanks.

## 2016-08-29 NOTE — Patient Instructions (Signed)
Rebecca Patterson , Thank you for taking time to come for your Medicare Wellness Visit. I appreciate your ongoing commitment to your health goals. Please review the following plan we discussed and let me know if I can assist you in the future.   Screening recommendations/referrals: Colonoscopy: N/A Mammogram: completed 07/30/16, due 07/2017 Bone Density: completed 02/15/15 Recommended yearly ophthalmology/optometry visit for glaucoma screening and checkup Recommended yearly dental visit for hygiene and checkup  Vaccinations: Influenza vaccine: due 10/2016 Pneumococcal vaccine: completed series Tdap vaccine: declined Shingles vaccine: N/A    Advanced directives: Please bring a copy of your POA (Power of Attorney) and/or Living Will to your next appointment.   Conditions/risks identified: Continue drinking 6-8 glasses of water a day.   Next appointment: None, need to schedule 1 year AWV   Preventive Care 34 Years and Older, Female Preventive care refers to lifestyle choices and visits with your health care provider that can promote health and wellness. What does preventive care include?  A yearly physical exam. This is also called an annual well check.  Dental exams once or twice a year.  Routine eye exams. Ask your health care provider how often you should have your eyes checked.  Personal lifestyle choices, including:  Daily care of your teeth and gums.  Regular physical activity.  Eating a healthy diet.  Avoiding tobacco and drug use.  Limiting alcohol use.  Practicing safe sex.  Taking low-dose aspirin every day.  Taking vitamin and mineral supplements as recommended by your health care provider. What happens during an annual well check? The services and screenings done by your health care provider during your annual well check will depend on your age, overall health, lifestyle risk factors, and family history of disease. Counseling  Your health care provider may ask  you questions about your:  Alcohol use.  Tobacco use.  Drug use.  Emotional well-being.  Home and relationship well-being.  Sexual activity.  Eating habits.  History of falls.  Memory and ability to understand (cognition).  Work and work Statistician.  Reproductive health. Screening  You may have the following tests or measurements:  Height, weight, and BMI.  Blood pressure.  Lipid and cholesterol levels. These may be checked every 5 years, or more frequently if you are over 49 years old.  Skin check.  Lung cancer screening. You may have this screening every year starting at age 35 if you have a 30-pack-year history of smoking and currently smoke or have quit within the past 15 years.  Fecal occult blood test (FOBT) of the stool. You may have this test every year starting at age 57.  Flexible sigmoidoscopy or colonoscopy. You may have a sigmoidoscopy every 5 years or a colonoscopy every 10 years starting at age 70.  Hepatitis C blood test.  Hepatitis B blood test.  Sexually transmitted disease (STD) testing.  Diabetes screening. This is done by checking your blood sugar (glucose) after you have not eaten for a while (fasting). You may have this done every 1-3 years.  Bone density scan. This is done to screen for osteoporosis. You may have this done starting at age 17.  Mammogram. This may be done every 1-2 years. Talk to your health care provider about how often you should have regular mammograms. Talk with your health care provider about your test results, treatment options, and if necessary, the need for more tests. Vaccines  Your health care provider may recommend certain vaccines, such as:  Influenza vaccine. This is recommended  every year.  Tetanus, diphtheria, and acellular pertussis (Tdap, Td) vaccine. You may need a Td booster every 10 years.  Zoster vaccine. You may need this after age 67.  Pneumococcal 13-valent conjugate (PCV13) vaccine. One dose  is recommended after age 38.  Pneumococcal polysaccharide (PPSV23) vaccine. One dose is recommended after age 29. Talk to your health care provider about which screenings and vaccines you need and how often you need them. This information is not intended to replace advice given to you by your health care provider. Make sure you discuss any questions you have with your health care provider. Document Released: 02/18/2015 Document Revised: 10/12/2015 Document Reviewed: 11/23/2014 Elsevier Interactive Patient Education  2017 [place] Prevention in the Home Falls can cause injuries. They can happen to people of all ages. There are many things you can do to make your home safe and to help prevent falls. What can I do on the outside of my home?  Regularly fix the edges of walkways and driveways and fix any cracks.  Remove anything that might make you trip as you walk through a door, such as a raised step or threshold.  Trim any bushes or trees on the path to your home.  Use bright outdoor lighting.  Clear any walking paths of anything that might make someone trip, such as rocks or tools.  Regularly check to see if handrails are loose or broken. Make sure that both sides of any steps have handrails.  Any raised decks and porches should have guardrails on the edges.  Have any leaves, snow, or ice cleared regularly.  Use sand or salt on walking paths during winter.  Clean up any spills in your garage right away. This includes oil or grease spills. What can I do in the bathroom?  Use night lights.  Install grab bars by the toilet and in the tub and shower. Do not use towel bars as grab bars.  Use non-skid mats or decals in the tub or shower.  If you need to sit down in the shower, use a plastic, non-slip stool.  Keep the floor dry. Clean up any water that spills on the floor as soon as it happens.  Remove soap buildup in the tub or shower regularly.  Attach bath mats  securely with double-sided non-slip rug tape.  Do not have throw rugs and other things on the floor that can make you trip. What can I do in the bedroom?  Use night lights.  Make sure that you have a light by your bed that is easy to reach.  Do not use any sheets or blankets that are too big for your bed. They should not hang down onto the floor.  Have a firm chair that has side arms. You can use this for support while you get dressed.  Do not have throw rugs and other things on the floor that can make you trip. What can I do in the kitchen?  Clean up any spills right away.  Avoid walking on wet floors.  Keep items that you use a lot in easy-to-reach places.  If you need to reach something above you, use a strong step stool that has a grab bar.  Keep electrical cords out of the way.  Do not use floor polish or wax that makes floors slippery. If you must use wax, use non-skid floor wax.  Do not have throw rugs and other things on the floor that can make you trip. What  can I do with my stairs?  Do not leave any items on the stairs.  Make sure that there are handrails on both sides of the stairs and use them. Fix handrails that are broken or loose. Make sure that handrails are as long as the stairways.  Check any carpeting to make sure that it is firmly attached to the stairs. Fix any carpet that is loose or worn.  Avoid having throw rugs at the top or bottom of the stairs. If you do have throw rugs, attach them to the floor with carpet tape.  Make sure that you have a light switch at the top of the stairs and the bottom of the stairs. If you do not have them, ask someone to add them for you. What else can I do to help prevent falls?  Wear shoes that:  Do not have high heels.  Have rubber bottoms.  Are comfortable and fit you well.  Are closed at the toe. Do not wear sandals.  If you use a stepladder:  Make sure that it is fully opened. Do not climb a closed  stepladder.  Make sure that both sides of the stepladder are locked into place.  Ask someone to hold it for you, if possible.  Clearly mark and make sure that you can see:  Any grab bars or handrails.  First and last steps.  Where the edge of each step is.  Use tools that help you move around (mobility aids) if they are needed. These include:  Canes.  Walkers.  Scooters.  Crutches.  Turn on the lights when you go into a dark area. Replace any light bulbs as soon as they burn out.  Set up your furniture so you have a clear path. Avoid moving your furniture around.  If any of your floors are uneven, fix them.  If there are any pets around you, be aware of where they are.  Review your medicines with your doctor. Some medicines can make you feel dizzy. This can increase your chance of falling. Ask your doctor what other things that you can do to help prevent falls. This information is not intended to replace advice given to you by your health care provider. Make sure you discuss any questions you have with your health care provider. Document Released: 11/18/2008 Document Revised: 06/30/2015 Document Reviewed: 02/26/2014 Elsevier Interactive Patient Education  2017 [employer].
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
## 2016-08-30 NOTE — Telephone Encounter (Signed)
Bone density scheduled at [facility] 02/18/17 at 10:20

## 2016-08-31 LAB — COMPREHENSIVE METABOLIC PANEL
ALBUMIN: 4.7 g/dL (ref 3.5–4.8)
ALK PHOS: 69 IU/L (ref 39–117)
ALT: 15 IU/L (ref 0–32)
AST: 15 IU/L (ref 0–40)
Albumin/Globulin Ratio: 2.1 (ref 1.2–2.2)
BUN / CREAT RATIO: 18 (ref 12–28)
BUN: 14 mg/dL (ref 8–27)
Bilirubin Total: 0.5 mg/dL (ref 0.0–1.2)
CALCIUM: 9.8 mg/dL (ref 8.7–10.3)
CO2: 28 mmol/L (ref 20–29)
CREATININE: 0.77 mg/dL (ref 0.57–1.00)
Chloride: 99 mmol/L (ref 96–106)
GFR, EST AFRICAN AMERICAN: 85 mL/min/{1.73_m2} (ref >59)
GFR, EST NON AFRICAN AMERICAN: 74 mL/min/{1.73_m2} (ref >59)
GLUCOSE: 127 mg/dL — AB (ref 65–99)
Globulin, Total: 2.2 g/dL (ref 1.5–4.5)
Potassium: 4.4 mmol/L (ref 3.5–5.2)
Sodium: 142 mmol/L (ref 134–144)
TOTAL PROTEIN: 6.9 g/dL (ref 6.0–8.5)

## 2016-08-31 LAB — T4 AND TSH
T4 TOTAL: 7.6 ug/dL (ref 4.5–12.0)
TSH: 1.02 u[IU]/mL (ref 0.450–4.500)

## 2016-08-31 LAB — LIPID PANEL
CHOLESTEROL TOTAL: 160 mg/dL (ref 100–199)
Chol/HDL Ratio: 2.5 ratio (ref 0.0–4.4)
HDL: 65 mg/dL (ref >39)
LDL Calculated: 71 mg/dL (ref 0–99)
TRIGLYCERIDES: 122 mg/dL (ref 0–149)
VLDL Cholesterol Cal: 24 mg/dL (ref 5–40)

## 2016-08-31 LAB — HEMOGLOBIN A1C
ESTIMATED AVERAGE GLUCOSE: 131 mg/dL
Hgb A1c MFr Bld: 6.2 % — ABNORMAL HIGH (ref 4.8–5.6)

## 2016-08-31 LAB — VITAMIN D 25 HYDROXY (VIT D DEFICIENCY, FRACTURES): Vit D, 25-Hydroxy: 35.5 ng/mL (ref 30.0–100.0)

## 2016-09-04 ENCOUNTER — Telehealth: Payer: Self-pay

## 2016-09-04 DIAGNOSIS — R1310 Dysphagia, unspecified: Secondary | ICD-10-CM

## 2016-09-04 DIAGNOSIS — R29898 Other symptoms and signs involving the musculoskeletal system: Secondary | ICD-10-CM

## 2016-09-04 NOTE — Telephone Encounter (Signed)
We ordered test to rule out stroke. We usually order CT with and without contrast unless they have protocol recommending different test.

## 2016-09-04 NOTE — Telephone Encounter (Signed)
Tonie with [facility] called in regards to pt's upcoming imaging. She is asking if you meant to order an angio-study of the brain, which would be a CTA head with and without, instead of the CT head you ordered. Please advise.

## 2016-09-05 NOTE — Telephone Encounter (Signed)
Returned call to [facility]. Nicole Kindred recommended we change order to CTA head w and without. Order was changed.

## 2016-09-06 ENCOUNTER — Ambulatory Visit: Admission: RE | Admit: 2016-09-06 | Payer: Medicare Other | Source: Ambulatory Visit

## 2016-09-06 ENCOUNTER — Ambulatory Visit
Admission: RE | Admit: 2016-09-06 | Discharge: 2016-09-06 | Disposition: A | Discharge status: Home / Self Care | Payer: Medicare Other | Source: Ambulatory Visit | Attending: Family Medicine | Admitting: Family Medicine

## 2016-09-06 DIAGNOSIS — R1310 Dysphagia, unspecified: Secondary | ICD-10-CM

## 2016-09-06 DIAGNOSIS — R1312 Dysphagia, oropharyngeal phase: Secondary | ICD-10-CM | POA: Diagnosis not present

## 2016-09-06 DIAGNOSIS — I6523 Occlusion and stenosis of bilateral carotid arteries: Secondary | ICD-10-CM | POA: Insufficient documentation

## 2016-09-06 DIAGNOSIS — R29898 Other symptoms and signs involving the musculoskeletal system: Secondary | ICD-10-CM

## 2016-09-06 MED ORDER — IOPAMIDOL (ISOVUE-370) INJECTION 76%
75.0000 mL | Freq: Once | INTRAVENOUS | Status: AC | PRN
Start: 2016-09-06 — End: 2016-09-06
  Administered 2016-09-06: 75 mL via INTRAVENOUS

## 2016-09-07 ENCOUNTER — Telehealth: Payer: Self-pay

## 2016-09-07 NOTE — Telephone Encounter (Signed)
Pt advised as below

## 2016-09-07 NOTE — Telephone Encounter (Signed)
-----   Message from Birdie Sons, MD sent at 09/06/2016  3:50 PM EDT ----- CT and carotid ultrasound are all normal. No sign of stroke or mini-stroke.

## 2016-10-05 ENCOUNTER — Encounter: Payer: Self-pay | Admitting: Family Medicine

## 2016-10-10 ENCOUNTER — Ambulatory Visit (INDEPENDENT_AMBULATORY_CARE_PROVIDER_SITE_OTHER): Payer: Medicare Other | Admitting: Family Medicine

## 2016-10-10 ENCOUNTER — Ambulatory Visit
Admission: RE | Admit: 2016-10-10 | Discharge: 2016-10-10 | Disposition: A | Discharge status: Home / Self Care | Payer: Medicare Other | Source: Ambulatory Visit | Attending: Family Medicine | Admitting: Family Medicine

## 2016-10-10 ENCOUNTER — Encounter: Payer: Self-pay | Admitting: Family Medicine

## 2016-10-10 VITALS — BP 110/80 | HR 62 | Temp 98.2°F | Resp 16 | Wt 145.0 lb

## 2016-10-10 DIAGNOSIS — M5136 Other intervertebral disc degeneration, lumbar region: Secondary | ICD-10-CM | POA: Diagnosis not present

## 2016-10-10 DIAGNOSIS — Z23 Encounter for immunization: Secondary | ICD-10-CM

## 2016-10-10 DIAGNOSIS — M5432 Sciatica, left side: Secondary | ICD-10-CM

## 2016-10-10 DIAGNOSIS — M47817 Spondylosis without myelopathy or radiculopathy, lumbosacral region: Secondary | ICD-10-CM | POA: Diagnosis not present

## 2016-10-10 DIAGNOSIS — M5431 Sciatica, right side: Secondary | ICD-10-CM | POA: Diagnosis not present

## 2016-10-10 DIAGNOSIS — I8393 Asymptomatic varicose veins of bilateral lower extremities: Secondary | ICD-10-CM

## 2016-10-10 MED ORDER — PREDNISONE 20 MG PO TABS: 20.0000 mg | ORAL_TABLET | Freq: Two times a day (BID) | ORAL | 0 refills | Status: AC

## 2016-10-10 NOTE — Patient Instructions (Signed)
   Start taking prednisone in the morning on Thursday September 6th, 2018

## 2016-10-10 NOTE — Progress Notes (Signed)
Patient: Rebecca Patterson Female    DOB: 1934/07/27   82 y.o.   MRN: [id number] Visit Date: 10/10/2016  Today's Provider: Lelon Huh, MD   Chief Complaint  Patient presents with  . Leg Pain   Subjective:    Patient has had low back and leg pain for over 1 year. Patient states the pain has gotten worse lately. Pain is intermittent, usually after she has been sitting or laying down for long periods of time. Also patient stated that it feels like the veins in her lower legs burn on occasion. Patient has been taking otc naproxen with mild relief.    Leg Pain   Incident onset: 1 year ago. There was no injury mechanism. The pain is present in the left leg and right leg. The quality of the pain is described as cramping and aching. The pain is at a severity of 8/10. The pain is moderate. The pain has been intermittent since onset. Pertinent negatives include no inability to bear weight, loss of motion, loss of sensation, muscle weakness, numbness or tingling. She reports no foreign bodies present. The symptoms are aggravated by weight bearing and movement. She has tried NSAIDs (naproxen) for the symptoms. The treatment provided mild relief.       Allergies  Allergen Reactions  . Fosamax [Alendronate]     Patient reports medication caused her to severe mood changes.     Current Outpatient Prescriptions:  .  aspirin EC 81 MG tablet, Take 81 mg by mouth daily., Disp: , Rfl:  .  calcium carbonate (OS-CAL) 600 MG TABS tablet, Take 600 mg by mouth daily with breakfast. , Disp: , Rfl:  .  docusate sodium (COLACE) 100 MG capsule, Take by mouth daily., Disp: , Rfl:  .  famotidine (PEPCID) 20 MG tablet, Take 20 mg by mouth as needed. , Disp: , Rfl:  .  ketoconazole (NIZORAL) 2 % cream, Apply 1 application topically daily as needed. , Disp: , Rfl:  .  levothyroxine (SYNTHROID, LEVOTHROID) 50 MCG tablet, TAKE 1 TABLET BY MOUTH DAILY, Disp: 90 tablet, Rfl: 2 .  Multiple Vitamins-Minerals  (PRESERVISION AREDS 2 PO), Take by mouth., Disp: , Rfl:  .  nystatin cream (MYCOSTATIN), APPLY TO AFFECTED AREA TWICE A DAY AS NEEDED, Disp: 60 g, Rfl: 2 .  Omega-3 Fatty Acids (FISH OIL DOUBLE STRENGTH) 1200 MG CAPS, Take 1 capsule by mouth daily. , Disp: , Rfl:  .  polyethylene glycol (MIRALAX / GLYCOLAX) packet, Take 17 g by mouth daily., Disp: , Rfl:  .  pravastatin (PRAVACHOL) 40 MG tablet, Take 1 tablet (40 mg total) by mouth daily., Disp: 90 tablet, Rfl: 4 .  triamcinolone cream (KENALOG) 0.1 %, APPLY TO AFFECTED AREA 3 TIMES A DAY FOR INSECT BITES, Disp: 60 g, Rfl: 5  Review of Systems  Constitutional: Negative for appetite change, chills, fatigue and fever.  Respiratory: Negative for chest tightness and shortness of breath.   Cardiovascular: Negative for chest pain and palpitations.  Gastrointestinal: Negative for abdominal pain, nausea and vomiting.  Musculoskeletal: Positive for back pain.  Neurological: Negative for dizziness, tingling, weakness and numbness.    Social History  Substance Use Topics  . Smoking status: Never Smoker  . Smokeless tobacco: Never Used  . Alcohol use No   Objective:   BP 110/80 (BP Location: Right Arm, Patient Position: Sitting, Cuff Size: Normal)   Pulse 62   Temp 98.2 F (36.8 C) (Oral)   Resp 16  Wt 145 lb (65.8 kg)   SpO2 98%   BMI 28.32 kg/m  Vitals:   10/10/16 1409  BP: 110/80  Pulse: 62  Resp: 16  Temp: 98.2 F (36.8 C)  TempSrc: Oral  SpO2: 98%  Weight: 145 lb (65.8 kg)     Physical Exam   General Appearance:    Alert, cooperative, no distress  Eyes:    PERRL, conjunctiva/corneas clear, EOM's intact       Lungs:     Clear to auscultation bilaterally, respirations unlabored  CV:    Regular rate and rhythm, +2 pedal pulses with brisk capillary refill. Warm extremities. Few superficial varicositiesNormal color. No tenderness, no cords.   Neurologic:   Awake, alert, oriented x 3. No apparent focal neurological            defect.           Assessment & Plan:     1. Bilateral sciatica  - predniSONE (DELTASONE) 20 MG tablet; Take 1 tablet (20 mg total) by mouth 2 (two) times daily with a meal.  Dispense: 20 tablet; Refill: 0 - DG Lumbar Spine Complete; Future  2. Varicose veins of both lower extremities Reassurance. Recommend OTC support stockings.   3. Need for influenza vaccination  - Flu vaccine HIGH DOSE PF   Call if symptoms change or if not rapidly improving.          Lelon Huh, MD  [facility]

## 2016-11-15 DIAGNOSIS — H353132 Nonexudative age-related macular degeneration, bilateral, intermediate dry stage: Secondary | ICD-10-CM | POA: Diagnosis not present

## 2016-12-05 ENCOUNTER — Other Ambulatory Visit: Payer: Self-pay | Admitting: Family Medicine

## 2016-12-05 DIAGNOSIS — W57XXXA Bitten or stung by nonvenomous insect and other nonvenomous arthropods, initial encounter: Secondary | ICD-10-CM

## 2016-12-05 NOTE — Telephone Encounter (Signed)
See refill request.

## 2017-02-18 ENCOUNTER — Ambulatory Visit
Admission: RE | Admit: 2017-02-18 | Discharge: 2017-02-18 | Disposition: A | Discharge status: Home / Self Care | Payer: Medicare Other | Source: Ambulatory Visit | Attending: Family Medicine | Admitting: Family Medicine

## 2017-02-18 ENCOUNTER — Telehealth: Payer: Self-pay

## 2017-02-18 ENCOUNTER — Encounter: Payer: Self-pay | Admitting: Family Medicine

## 2017-02-18 DIAGNOSIS — M85852 Other specified disorders of bone density and structure, left thigh: Secondary | ICD-10-CM | POA: Insufficient documentation

## 2017-02-18 DIAGNOSIS — M810 Age-related osteoporosis without current pathological fracture: Secondary | ICD-10-CM | POA: Diagnosis not present

## 2017-02-18 DIAGNOSIS — Z780 Asymptomatic menopausal state: Secondary | ICD-10-CM | POA: Diagnosis not present

## 2017-02-18 DIAGNOSIS — M818 Other osteoporosis without current pathological fracture: Secondary | ICD-10-CM

## 2017-02-18 DIAGNOSIS — T50905A Adverse effect of unspecified drugs, medicaments and biological substances, initial encounter: Secondary | ICD-10-CM

## 2017-02-18 NOTE — Telephone Encounter (Signed)
-----   Message from Birdie Sons, MD sent at 02/18/2017 12:24 PM EST ----- BMD shows osteoporosis. Need to start risedronate 25mg  once a week, #4, rf x 12. Follow up o.v. 3 months.

## 2017-02-18 NOTE — Telephone Encounter (Signed)
Advised patient as below. Dr. Caryn Section, we dont have risedronate 25mg  in the EMR. They have 5mg , 30mg , and 35mg . Do I call this is verbally? Please advise. Thanks!

## 2017-02-19 MED ORDER — RISEDRONATE SODIUM 35 MG PO TABS: 35.0000 mg | ORAL_TABLET | ORAL | 12 refills | Status: DC

## 2017-02-27 DIAGNOSIS — L578 Other skin changes due to chronic exposure to nonionizing radiation: Secondary | ICD-10-CM | POA: Diagnosis not present

## 2017-02-27 DIAGNOSIS — L570 Actinic keratosis: Secondary | ICD-10-CM | POA: Diagnosis not present

## 2017-02-27 DIAGNOSIS — Z859 Personal history of malignant neoplasm, unspecified: Secondary | ICD-10-CM | POA: Diagnosis not present

## 2017-02-27 DIAGNOSIS — Z872 Personal history of diseases of the skin and subcutaneous tissue: Secondary | ICD-10-CM | POA: Diagnosis not present

## 2017-02-27 DIAGNOSIS — Z85828 Personal history of other malignant neoplasm of skin: Secondary | ICD-10-CM | POA: Diagnosis not present

## 2017-03-02 ENCOUNTER — Other Ambulatory Visit: Payer: Self-pay | Admitting: Family Medicine

## 2017-03-02 DIAGNOSIS — E039 Hypothyroidism, unspecified: Secondary | ICD-10-CM

## 2017-03-03 ENCOUNTER — Other Ambulatory Visit: Payer: Self-pay | Admitting: Family Medicine

## 2017-03-25 ENCOUNTER — Telehealth: Payer: Self-pay | Admitting: Family Medicine

## 2017-03-25 NOTE — Telephone Encounter (Signed)
Please advise 

## 2017-03-25 NOTE — Telephone Encounter (Signed)
Pt states the risedronate 35 MG isn't working.  Pt states she is nervous all the time.  She said she feels like her body is running all the time and she doesn't like that.   Wants to see if there is something different she can take.

## 2017-03-25 NOTE — Telephone Encounter (Signed)
Can change to Boniva 150mg  one tablet once a MONTH, #1, rf x 12 Take first dose one week after last dose of risedronate.

## 2017-03-26 MED ORDER — IBANDRONATE SODIUM 150 MG PO TABS: 150.0000 mg | ORAL_TABLET | ORAL | 12 refills | Status: DC

## 2017-03-26 NOTE — Telephone Encounter (Signed)
Patient was advised. Rx was phoned into pharmacy.

## 2017-04-01 ENCOUNTER — Ambulatory Visit
Admission: RE | Admit: 2017-04-01 | Discharge: 2017-04-01 | Disposition: A | Discharge status: Home / Self Care | Payer: Medicare Other | Source: Ambulatory Visit | Attending: Internal Medicine | Admitting: Internal Medicine

## 2017-04-01 DIAGNOSIS — R921 Mammographic calcification found on diagnostic imaging of breast: Secondary | ICD-10-CM | POA: Diagnosis not present

## 2017-04-01 DIAGNOSIS — C50811 Malignant neoplasm of overlapping sites of right female breast: Secondary | ICD-10-CM

## 2017-04-01 DIAGNOSIS — Z853 Personal history of malignant neoplasm of breast: Secondary | ICD-10-CM | POA: Diagnosis not present

## 2017-04-01 DIAGNOSIS — Z08 Encounter for follow-up examination after completed treatment for malignant neoplasm: Secondary | ICD-10-CM | POA: Insufficient documentation

## 2017-04-01 DIAGNOSIS — Z170 Estrogen receptor positive status [ER+]: Secondary | ICD-10-CM

## 2017-05-14 DIAGNOSIS — H353132 Nonexudative age-related macular degeneration, bilateral, intermediate dry stage: Secondary | ICD-10-CM | POA: Diagnosis not present

## 2017-05-20 ENCOUNTER — Ambulatory Visit (INDEPENDENT_AMBULATORY_CARE_PROVIDER_SITE_OTHER): Payer: Medicare Other | Admitting: Family Medicine

## 2017-05-20 ENCOUNTER — Encounter: Payer: Self-pay | Admitting: Family Medicine

## 2017-05-20 VITALS — BP 124/74 | HR 61 | Temp 97.7°F | Resp 16 | Wt 144.0 lb

## 2017-05-20 DIAGNOSIS — J329 Chronic sinusitis, unspecified: Secondary | ICD-10-CM | POA: Diagnosis not present

## 2017-05-20 DIAGNOSIS — R251 Tremor, unspecified: Secondary | ICD-10-CM

## 2017-05-20 DIAGNOSIS — M810 Age-related osteoporosis without current pathological fracture: Secondary | ICD-10-CM

## 2017-05-20 MED ORDER — AZITHROMYCIN 250 MG PO TABS: ORAL_TABLET | ORAL | 0 refills | Status: AC

## 2017-05-20 NOTE — Patient Instructions (Addendum)
   You may need to start taking Azithromycin (antibiotic) if you do not continue to get better or if you are not completely better in a week.   Call for referral to neurologist if the tremors have not resolved within another 3 weeks

## 2017-05-20 NOTE — Progress Notes (Signed)
Patient: Rebecca Patterson Female    DOB: [birth year]   83 y.o.   MRN: [id number] Visit Date: 05/20/2017  Today's Provider: Lelon Huh, MD   Chief Complaint  Patient presents with  . Cough   Subjective:    Patient has had cough and nasal drainage for 1 week. Other symptoms include: headaches, chest tightness and runny nose. Patient has been taking otc tylenol with mild relkief.   Cough  This is a new problem. The cough is productive of sputum. Associated symptoms include chills, headaches, nasal congestion and rhinorrhea. Pertinent negatives include no chest pain, ear congestion, ear pain, fever, heartburn, hemoptysis, myalgias, postnasal drip, rash, sore throat, shortness of breath, sweats, weight loss or wheezing. Nothing aggravates the symptoms. Treatments tried: tylenol. The treatment provided mild relief.   She also reports that since she took dose of Boniva in march that she has had constant shakes in hands and arms. She only took once dose and will not take another.     Allergies  Allergen Reactions  . Fosamax [Alendronate]     Patient reports medication caused her to severe mood changes.     Current Outpatient Medications:  .  aspirin EC 81 MG tablet, Take 81 mg by mouth daily., Disp: , Rfl:  .  calcium carbonate (OS-CAL) 600 MG TABS tablet, Take 600 mg by mouth daily with breakfast. , Disp: , Rfl:  .  docusate sodium (COLACE) 100 MG capsule, Take by mouth daily., Disp: , Rfl:  .  famotidine (PEPCID) 20 MG tablet, Take 20 mg by mouth as needed. , Disp: , Rfl:  .  ketoconazole (NIZORAL) 2 % cream, Apply 1 application topically daily as needed. , Disp: , Rfl:  .  levothyroxine (SYNTHROID, LEVOTHROID) 50 MCG tablet, TAKE 1 TABLET BY MOUTH DAILY, Disp: 90 tablet, Rfl: 3 .  Multiple Vitamins-Minerals (PRESERVISION AREDS 2 PO), Take by mouth., Disp: , Rfl:  .  nystatin cream (MYCOSTATIN), APPLY TO AFFECTED AREA TWICE A DAY AS NEEDED, Disp: 60 g, Rfl: 2 .  Omega-3 Fatty  Acids (FISH OIL DOUBLE STRENGTH) 1200 MG CAPS, Take 1 capsule by mouth daily. , Disp: , Rfl:  .  polyethylene glycol (MIRALAX / GLYCOLAX) packet, Take 17 g by mouth daily., Disp: , Rfl:  .  pravastatin (PRAVACHOL) 40 MG tablet, TAKE 1 TABLET (40 MG TOTAL) BY MOUTH DAILY., Disp: 90 tablet, Rfl: 4 .  triamcinolone cream (KENALOG) 0.1 %, APPLY TO AFFECTED AREA 3 TIMES A DAY FOR INSECT BITES, Disp: 60 g, Rfl: 5    Review of Systems  Constitutional: Positive for chills. Negative for appetite change, fatigue, fever and weight loss.  HENT: Positive for congestion, rhinorrhea and sinus pressure. Negative for ear pain, postnasal drip and sore throat.   Respiratory: Positive for cough. Negative for hemoptysis, chest tightness, shortness of breath and wheezing.   Cardiovascular: Negative for chest pain and palpitations.  Gastrointestinal: Negative for abdominal pain, heartburn, nausea and vomiting.  Musculoskeletal: Negative for myalgias.  Skin: Negative for rash.  Neurological: Positive for headaches. Negative for dizziness and weakness.    Social History   Tobacco Use  . Smoking status: Never Smoker  . Smokeless tobacco: Never Used  Substance Use Topics  . Alcohol use: No   Objective:   BP 124/74 (BP Location: Right Arm, Patient Position: Sitting, Cuff Size: Normal)   Pulse 61   Temp 97.7 F (36.5 C) (Oral)   Resp 16   Wt 144 lb (  65.3 kg)   SpO2 99%   BMI 28.12 kg/m  Vitals:   05/20/17 1506  BP: 124/74  Pulse: 61  Resp: 16  Temp: 97.7 F (36.5 C)  TempSrc: Oral  SpO2: 99%  Weight: 144 lb (65.3 kg)     Physical Exam  General Appearance:    Alert, cooperative, no distress  HENT:   bilateral TM normal without fluid or infection, neck without nodes, throat normal without erythema or exudate, frontal sinus tender and nasal mucosa pale and congested  Eyes:    PERRL, conjunctiva/corneas clear, EOM's intact       Lungs:     Clear to auscultation bilaterally, respirations  unlabored  Heart:    Regular rate and rhythm  Neurologic:   Awake, alert, oriented x 3. No apparent focal neurological           defect.           Assessment & Plan:     1. Sinusitis, unspecified chronicity, unspecified location Improving, likely viral. Printed azithromycin to fill only if not continuing to improve.   2. Tremors of nervous system She states this started after taking Boniva at the beginning of march. Advised that if this is an adverse reaction to medication it should be improving by now. She is going to give it a few more weeks, and advised to call for neurology referral if not resolved by the end of the month.   3. Osteoporosis, unspecified osteoporosis type, unspecified pathological fracture presence Failed multiple bisphosphonate. Is on calcium and vitamin d supplements. Consider referral for Prolia if BMD continues to worsen.        Lelon Huh, MD  [facility]

## 2017-06-14 ENCOUNTER — Inpatient Hospital Stay (HOSPITAL_BASED_OUTPATIENT_CLINIC_OR_DEPARTMENT_OTHER): Payer: Medicare Other | Admitting: Internal Medicine

## 2017-06-14 ENCOUNTER — Encounter: Payer: Self-pay | Admitting: Internal Medicine

## 2017-06-14 ENCOUNTER — Other Ambulatory Visit: Payer: Self-pay

## 2017-06-14 ENCOUNTER — Inpatient Hospital Stay: Payer: Medicare Other | Attending: Internal Medicine

## 2017-06-14 VITALS — BP 119/78 | HR 66 | Temp 98.0°F | Resp 18 | Ht 60.0 in | Wt 142.0 lb

## 2017-06-14 DIAGNOSIS — Z853 Personal history of malignant neoplasm of breast: Secondary | ICD-10-CM | POA: Insufficient documentation

## 2017-06-14 DIAGNOSIS — M818 Other osteoporosis without current pathological fracture: Secondary | ICD-10-CM | POA: Diagnosis not present

## 2017-06-14 DIAGNOSIS — C50811 Malignant neoplasm of overlapping sites of right female breast: Secondary | ICD-10-CM

## 2017-06-14 DIAGNOSIS — Z170 Estrogen receptor positive status [ER+]: Secondary | ICD-10-CM

## 2017-06-14 LAB — CBC WITH DIFFERENTIAL/PLATELET
BASOS ABS: 0 10*3/uL (ref 0–0.1)
BASOS PCT: 1 %
Eosinophils Absolute: 0.1 10*3/uL (ref 0–0.7)
Eosinophils Relative: 2 %
HEMATOCRIT: 37.8 % (ref 35.0–47.0)
Hemoglobin: 13 g/dL (ref 12.0–16.0)
Lymphocytes Relative: 27 %
Lymphs Abs: 1.7 10*3/uL (ref 1.0–3.6)
MCH: 31.6 pg (ref 26.0–34.0)
MCHC: 34.4 g/dL (ref 32.0–36.0)
MCV: 91.8 fL (ref 80.0–100.0)
Monocytes Absolute: 0.5 10*3/uL (ref 0.2–0.9)
Monocytes Relative: 8 %
NEUTROS ABS: 4 10*3/uL (ref 1.4–6.5)
Neutrophils Relative %: 62 %
Platelets: 212 10*3/uL (ref 150–440)
RBC: 4.12 MIL/uL (ref 3.80–5.20)
RDW: 13.8 % (ref 11.5–14.5)
WBC: 6.4 10*3/uL (ref 3.6–11.0)

## 2017-06-14 NOTE — Progress Notes (Signed)
[place] OFFICE PROGRESS NOTE  Patient Care Team: Birdie Sons, MD as PCP - General (Family Medicine) Leandrew Koyanagi, MD as Referring Physician (Ophthalmology) Cammie Sickle, MD as Consulting Physician (Internal Medicine)  Cancer Staging No matching staging information was found for the patient.   Oncology History   # 2013- Stage IA (pT1b pN0 (sn) cM0) grade 1 invasive mammary carcinoma of the right breast status post wide local excision and sentinel node biopsy on 04/05/11.; Tumor size 9 mm, grade 1, margins uninvolved by invasive carcinoma.  No DCIS.  1 sentinel lymph node negative;  ER/PR positive (90%),  HER-2/neu negative by FISH (HER2/CEP17 ratio 1.04). S/p RT; no chemo.   # Patient started anastrozole March 2013, changed to exemestane May 2013 due to side effects [until May 2018]  # OSTEOPOROSIS- Feb 2019- ca+vit D: Intol to oral Bisphosphonates; Reluctant to reclast/Prolia.      Carcinoma of overlapping sites of right breast in female, estrogen receptor positive ([place])      INTERVAL HISTORY:  Rebecca Patterson 83 y.o.  female pleasant patient above history of stage I breast cancer ER PR positive HER-2/neu negative-status post aromatase inhibitor; finished May 2018 is here for follow-up.  Patient denies lumps or bumps.  Denies any loss of appetite.  No nausea no vomiting.  Appetite is good.   Review of Systems  Constitutional: Negative for chills, diaphoresis, fever, malaise/fatigue and weight loss.  HENT: Negative for nosebleeds and sore throat.   Eyes: Negative for double vision.  Respiratory: Negative for cough, hemoptysis, sputum production, shortness of breath and wheezing.   Cardiovascular: Negative for chest pain, palpitations, orthopnea and leg swelling.  Gastrointestinal: Negative for abdominal pain, blood in stool, constipation, diarrhea, heartburn, melena, nausea and vomiting.  Genitourinary: Negative for dysuria, frequency and  urgency.  Musculoskeletal: Negative for back pain and joint pain.  Skin: Negative.  Negative for itching and rash.  Neurological: Negative for dizziness, tingling, focal weakness, weakness and headaches.  Endo/Heme/Allergies: Does not bruise/bleed easily.  Psychiatric/Behavioral: Negative for depression. The patient is not nervous/anxious and does not have insomnia.       PAST MEDICAL HISTORY :  Past Medical History:  Diagnosis Date  . Allergy   . Breast cancer ([place]) 2013   Rt  . Cancer [facility]) 2013   Right Breast- radiation and Tamoxifen  . Complication of anesthesia   . Corn   . Family history of adverse reaction to anesthesia    sister - slow to wake  . Fibrocystic breast   . GERD (gastroesophageal reflux disease)   . Hyperlipidemia   . Osteoporosis   . Osteoporosis   . PONV (postoperative nausea and vomiting)   . Thyroid disease   . Vertigo    long ago  . Wears dentures    full upper and lower  . Wears hearing aid    bilateral    PAST SURGICAL HISTORY :   Past Surgical History:  Procedure Laterality Date  . ABDOMINAL HYSTERECTOMY    . BREAST BIOPSY Right 2013   Stereo- Positive  . BREAST EXCISIONAL BIOPSY Right 2013  . BREAST LUMPECTOMY Right    2013  . BUNIONECTOMY Bilateral 2004  . 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 CATARACT EXTRACTION W/PHACO Right 11/10/2014   Procedure: CATARACT EXTRACTION PHACO AND INTRAOCULAR LENS PLACEMENT (IOC);  Surgeon: Leandrew Koyanagi, MD;  Location: [facility] [place];  Service: Ophthalmology;  Laterality: Right;  . CHOLECYSTECTOMY    . KNEE SURGERY  Right 2005  . Mastoid surgery Right   . SKIN CANCER EXCISION  03/17/2014   right side of face Dr. Aubery Lapping  . THYROID SURGERY N/A 1980   Goiter    FAMILY HISTORY :   Family History  Problem Relation Age of Onset  . Stroke Sister   . Breast cancer Sister 42  . Cancer Sister        breast   . Pneumonia Other     SOCIAL HISTORY:   Social History    Tobacco Use  . Smoking status: Never Smoker  . Smokeless tobacco: Never Used  Substance Use Topics  . Alcohol use: No  . Drug use: No    ALLERGIES:  is allergic to actonel [risedronate]; boniva [ibandronic acid]; and fosamax [alendronate].  MEDICATIONS:  Current Outpatient Medications  Medication Sig Dispense Refill  . aspirin EC 81 MG tablet Take 81 mg by mouth daily.    . calcium carbonate (OS-CAL) 600 MG TABS tablet Take 600 mg by mouth daily with breakfast.     . docusate sodium (COLACE) 100 MG capsule Take by mouth daily.    . famotidine (PEPCID) 20 MG tablet Take 20 mg by mouth as needed.     Marland Kitchen ketoconazole (NIZORAL) 2 % cream Apply 1 application topically daily as needed.     Marland Kitchen levothyroxine (SYNTHROID, LEVOTHROID) 50 MCG tablet TAKE 1 TABLET BY MOUTH DAILY 90 tablet 3  . Multiple Vitamins-Minerals (PRESERVISION AREDS 2 PO) Take by mouth.    . nystatin cream (MYCOSTATIN) APPLY TO AFFECTED AREA TWICE A DAY AS NEEDED 60 g 2  . Omega-3 Fatty Acids (FISH OIL DOUBLE STRENGTH) 1200 MG CAPS Take 1 capsule by mouth daily.     . pravastatin (PRAVACHOL) 40 MG tablet TAKE 1 TABLET (40 MG TOTAL) BY MOUTH DAILY. 90 tablet 4  . triamcinolone cream (KENALOG) 0.1 % APPLY TO AFFECTED AREA 3 TIMES A DAY FOR INSECT BITES 60 g 5  . azithromycin (ZITHROMAX) 250 MG tablet Take 1 tablet by mouth daily.    . polyethylene glycol (MIRALAX / GLYCOLAX) packet Take 17 g by mouth daily.     No current facility-administered medications for this visit.     PHYSICAL EXAMINATION: ECOG PERFORMANCE STATUS: 0 - Asymptomatic  BP 119/78 (BP Location: Right Arm, Patient Position: Sitting)   Pulse 66   Temp 98 F (36.7 C) (Tympanic)   Resp 18   Ht 5' (1.524 m)   Wt 142 lb (64.4 kg)   BMI 27.73 kg/m   Filed Weights   06/14/17 1421  Weight: 142 lb (64.4 kg)    GENERAL: Well-nourished well-developed; Alert, no distress and comfortable.  Alone. EYES: no pallor or icterus OROPHARYNX: no thrush or  ulceration; NECK: supple; no lymph nodes felt. LYMPH:  no palpable lymphadenopathy in the axillary or inguinal regions LUNGS: Decreased breath sounds auscultation bilaterally. No wheeze or crackles HEART/CVS: regular rate & rhythm and no murmurs; No lower extremity edema ABDOMEN:abdomen soft, non-tender and normal bowel sounds. No hepatomegaly or splenomegaly.  Musculoskeletal:no cyanosis of digits and no clubbing  PSYCH: alert & oriented x 3 with fluent speech NEURO: no focal motor/sensory deficits SKIN:  no rashes or significant lesions  Right and left BREAST exam [in the presence of nurse]- no unusual skin changes or dominant masses felt. Surgical scars noted.      LABORATORY DATA:  I have reviewed the data as listed    Component Value Date/Time   NA 142 08/29/2016 1058  K 4.4 08/29/2016 1058   CL 99 08/29/2016 1058   CO2 28 08/29/2016 1058   GLUCOSE 127 (H) 08/29/2016 1058   GLUCOSE 108 (H) 06/15/2016 1105   BUN 14 08/29/2016 1058   CREATININE 0.77 08/29/2016 1058   CREATININE 0.85 11/04/2013 1029   CALCIUM 9.8 08/29/2016 1058   PROT 6.9 08/29/2016 1058   PROT 6.9 11/04/2013 1029   ALBUMIN 4.7 08/29/2016 1058   ALBUMIN 3.9 11/04/2013 1029   AST 15 08/29/2016 1058   AST 9 (L) 11/04/2013 1029   ALT 15 08/29/2016 1058   ALT 29 11/04/2013 1029   ALKPHOS 69 08/29/2016 1058   ALKPHOS 77 11/04/2013 1029   BILITOT 0.5 08/29/2016 1058   BILITOT 0.3 11/04/2013 1029   GFRNONAA 74 08/29/2016 1058   GFRNONAA >60 11/04/2013 1029   GFRNONAA >60 11/03/2012 1106   GFRAA 85 08/29/2016 1058   GFRAA >60 11/04/2013 1029   GFRAA >60 11/03/2012 1106    No results found for: SPEP, UPEP  Lab Results  Component Value Date   WBC 6.4 06/14/2017   NEUTROABS 4.0 06/14/2017   HGB 13.0 06/14/2017   HCT 37.8 06/14/2017   MCV 91.8 06/14/2017   PLT 212 06/14/2017      Chemistry      Component Value Date/Time   NA 142 08/29/2016 1058   K 4.4 08/29/2016 1058   CL 99 08/29/2016 1058    CO2 28 08/29/2016 1058   BUN 14 08/29/2016 1058   CREATININE 0.77 08/29/2016 1058   CREATININE 0.85 11/04/2013 1029   GLU 101 07/01/2013      Component Value Date/Time   CALCIUM 9.8 08/29/2016 1058   ALKPHOS 69 08/29/2016 1058   ALKPHOS 77 11/04/2013 1029   AST 15 08/29/2016 1058   AST 9 (L) 11/04/2013 1029   ALT 15 08/29/2016 1058   ALT 29 11/04/2013 1029   BILITOT 0.5 08/29/2016 1058   BILITOT 0.3 11/04/2013 1029       RADIOGRAPHIC STUDIES: I have personally reviewed the radiological images as listed and agreed with the findings in the report. No results found.   ASSESSMENT & PLAN:  Carcinoma of overlapping sites of right breast in female, estrogen receptor positive ([place]) #Stage I breast cancer-status post adjuvant Aromasin; finished May 2018.  #Clinically no evidence of recurrence; patient had a mammogram-February 2019-for follow-up dystrophic calcification; recommend repeat mammogram in July 2019.  # BMD jan 2017- osteopenia- -2.6; discussed re: risks/of progressive worsening of her osteoporosis.  Patient previously intolerant to oral bisphosphonates.  Discussed regarding exercise/continue calcium vitamin D.  Also discussed regarding Reclast Prolia [patient concerned about the cost.]  # 12 months follow up/labs;mammogram in July 2019; bil diagnostic.   # 25 minutes face-to-face with the patient discussing the above plan of care; more than 50% of time spent on prognosis/ natural history; counseling and coordination.    Orders Placed This Encounter  Procedures  . MM DIAG BREAST TOMO BILATERAL    Standing Status:   Future    Standing Expiration Date:   06/15/2018    Order Specific Question:   Reason for Exam (SYMPTOM  OR DIAGNOSIS REQUIRED)    Answer:   right breast lumpectomy in 2013    Order Specific Question:   Preferred imaging location?    Answer:   [facility]  . US Breast Limited Uni Left Inc Axilla    Standing Status:   Future    Standing Expiration  Date:   08/15/2018    Order  Specific Question:   Reason for Exam (SYMPTOM  OR DIAGNOSIS REQUIRED)    Answer:   right breast lumpectomy in 2013    Order Specific Question:   Preferred imaging location?    Answer:   [facility]  . US Breast Limited Uni Right Inc Axilla    Standing Status:   Future    Standing Expiration Date:   08/15/2018    Order Specific Question:   Reason for Exam (SYMPTOM  OR DIAGNOSIS REQUIRED)    Answer:   right breast lumpectomy in 2013    Order Specific Question:   Preferred imaging location?    Answer:   [facility]  . CBC with Differential    Standing Status:   Future    Standing Expiration Date:   12/16/2018  . Comprehensive metabolic panel    Standing Status:   Future    Standing Expiration Date:   12/16/2018   All questions were answered. The patient knows to call the clinic with any problems, questions or concerns.      Cammie Sickle, MD 06/14/2017 5:22 PM

## 2017-06-14 NOTE — Assessment & Plan Note (Addendum)
#  Stage I breast cancer-status post adjuvant Aromasin; finished May 2018.  #Clinically no evidence of recurrence; patient had a mammogram-February 2019-for follow-up dystrophic calcification; recommend repeat mammogram in July 2019.  # BMD jan 2017- osteopenia- -2.6; discussed re: risks/of progressive worsening of her osteoporosis.  Patient previously intolerant to oral bisphosphonates.  Discussed regarding exercise/continue calcium vitamin D.  Also discussed regarding Reclast Prolia [patient concerned about the cost.]  # 12 months follow up/labs;mammogram in July 2019; bil diagnostic.   # 25 minutes face-to-face with the patient discussing the above plan of care; more than 50% of time spent on prognosis/ natural history; counseling and coordination.

## 2017-06-20 ENCOUNTER — Other Ambulatory Visit: Payer: Self-pay | Admitting: Internal Medicine

## 2017-06-20 DIAGNOSIS — Z1231 Encounter for screening mammogram for malignant neoplasm of breast: Secondary | ICD-10-CM

## 2017-06-26 ENCOUNTER — Ambulatory Visit (INDEPENDENT_AMBULATORY_CARE_PROVIDER_SITE_OTHER): Payer: Medicare Other | Admitting: Family Medicine

## 2017-06-26 ENCOUNTER — Encounter: Payer: Self-pay | Admitting: Family Medicine

## 2017-06-26 VITALS — BP 120/76 | HR 53 | Temp 97.6°F | Wt 143.0 lb

## 2017-06-26 DIAGNOSIS — H9193 Unspecified hearing loss, bilateral: Secondary | ICD-10-CM | POA: Diagnosis not present

## 2017-06-26 DIAGNOSIS — E071 Dyshormogenetic goiter: Secondary | ICD-10-CM

## 2017-06-26 DIAGNOSIS — M5441 Lumbago with sciatica, right side: Secondary | ICD-10-CM | POA: Diagnosis not present

## 2017-06-26 DIAGNOSIS — G8929 Other chronic pain: Secondary | ICD-10-CM | POA: Diagnosis not present

## 2017-06-26 DIAGNOSIS — W57XXXA Bitten or stung by nonvenomous insect and other nonvenomous arthropods, initial encounter: Secondary | ICD-10-CM

## 2017-06-26 DIAGNOSIS — R251 Tremor, unspecified: Secondary | ICD-10-CM | POA: Diagnosis not present

## 2017-06-26 DIAGNOSIS — M5442 Lumbago with sciatica, left side: Secondary | ICD-10-CM

## 2017-06-26 DIAGNOSIS — E049 Nontoxic goiter, unspecified: Secondary | ICD-10-CM | POA: Insufficient documentation

## 2017-06-26 NOTE — Progress Notes (Signed)
Patient: Rebecca Patterson Female    DOB: 11-Jan-1935   83 y.o.   MRN: [id number] Visit Date: 06/26/2017  Today's Provider: Lavon Paganini, MD   Chief Complaint  Patient presents with  . Tremors  . Back Pain   Subjective:    HPI Patient presents today with compliants of hand tremors, feeling fatigue, lower back pain that radiates to knees, and a sore spot on mid back area. Patient is also requesting a  referral to [facility] ENT for hearing issues.   Patient reports intermittent tremor of her left hand for about 1 month.  She states that it can occur at rest or when she is reaching for things.  It seems to have been intermittently.  She has not noticed anything that makes it better or worse.  She has never had a tremor like this before.  She also describes a vibrating feeling in her body that feels as though she has a "motor inside me."  Patient also reports midline low back pain with radiation down bilateral lateral thighs to the knees.  This is been going on for many months.  She describes as an aching pain.  It occurs after she has been sitting or standing for long periods of time.  She was seen for this in 10/2016 by Dr. Caryn Section he obtained lumbar x-rays and gave 10-day course of prednisone. she does not remember if the prednisone helped.  She denies any weakness, numbness, saddle anesthesia, change in bowel or bladder habits.   Patient reports getting 3 tick bites on her left side about 3 weeks ago.  She was able to remove the ticks completely with the ease.  She has noticed small red bumps where the bites were that are itchy.  She has noticed no other rashes or fevers.     Allergies  Allergen Reactions  . Actonel [Risedronate]   . Boniva [Ibandronic Acid]     shakes  . Fosamax [Alendronate]     Patient reports medication caused her to severe mood changes.     Current Outpatient Medications:  .  aspirin EC 81 MG tablet, Take 81 mg by mouth daily., Disp: , Rfl:  .   calcium carbonate (OS-CAL) 600 MG TABS tablet, Take 600 mg by mouth daily with breakfast. , Disp: , Rfl:  .  docusate sodium (COLACE) 100 MG capsule, Take by mouth daily., Disp: , Rfl:  .  famotidine (PEPCID) 20 MG tablet, Take 20 mg by mouth as needed. , Disp: , Rfl:  .  ketoconazole (NIZORAL) 2 % cream, Apply 1 application topically daily as needed. , Disp: , Rfl:  .  levothyroxine (SYNTHROID, LEVOTHROID) 50 MCG tablet, TAKE 1 TABLET BY MOUTH DAILY, Disp: 90 tablet, Rfl: 3 .  Multiple Vitamins-Minerals (PRESERVISION AREDS 2 PO), Take by mouth., Disp: , Rfl:  .  nystatin cream (MYCOSTATIN), APPLY TO AFFECTED AREA TWICE A DAY AS NEEDED, Disp: 60 g, Rfl: 2 .  Omega-3 Fatty Acids (FISH OIL DOUBLE STRENGTH) 1200 MG CAPS, Take 1 capsule by mouth daily. , Disp: , Rfl:  .  polyethylene glycol (MIRALAX / GLYCOLAX) packet, Take 17 g by mouth daily., Disp: , Rfl:  .  pravastatin (PRAVACHOL) 40 MG tablet, TAKE 1 TABLET (40 MG TOTAL) BY MOUTH DAILY., Disp: 90 tablet, Rfl: 4 .  triamcinolone cream (KENALOG) 0.1 %, APPLY TO AFFECTED AREA 3 TIMES A DAY FOR INSECT BITES, Disp: 60 g, Rfl: 5  Review of Systems  Constitutional: Positive  for fatigue.  Respiratory: Negative.  Stridor: hearing difficulties    Cardiovascular: Negative.   Musculoskeletal: Positive for back pain.  Neurological: Positive for tremors.    Social History   Tobacco Use  . Smoking status: Never Smoker  . Smokeless tobacco: Never Used  Substance Use Topics  . Alcohol use: No   Objective:   BP 120/76 (BP Location: Right Arm, Patient Position: Sitting, Cuff Size: Normal)   Pulse (!) 53   Temp 97.6 F (36.4 C) (Oral)   Wt 143 lb (64.9 kg)   SpO2 99%   BMI 27.93 kg/m    Physical Exam  Constitutional: She is oriented to person, place, and time. She appears well-developed and well-nourished. No distress.  HENT:  Head: Normocephalic and atraumatic.  Right Ear: External ear normal.  Left Ear: External ear normal.  Eyes: Pupils  are equal, round, and reactive to light. Conjunctivae and EOM are normal. No scleral icterus.  Neck: Neck supple. No thyromegaly present.  Cardiovascular: Normal rate, regular rhythm, normal heart sounds and intact distal pulses.  No murmur heard. Pulmonary/Chest: Effort normal and breath sounds normal. No respiratory distress. She has no wheezes. She has no rales.  Musculoskeletal: She exhibits no edema or deformity.  Back: no midline TTP over spinous processes.  TTP over musculature b/l over lumbosacral area.  Straight leg raise negative bilaterally.  Strength intact over upper and lower extremities.  Sensation to light touch intact diffusely.  Lymphadenopathy:    She has no cervical adenopathy.  Neurological: She is alert and oriented to person, place, and time. She has normal strength. She displays no tremor. No cranial nerve deficit or sensory deficit. She exhibits normal muscle tone. She displays a negative Romberg sign. Coordination and gait normal.  Skin: Skin is warm and dry. No rash noted.  3 small erythematous bites on L flank/waistband area  Psychiatric: She has a normal mood and affect. Her behavior is normal.  Vitals reviewed.       Assessment & Plan:    1. Bilateral hearing loss, unspecified hearing loss type - Ambulatory referral to ENT  2. Chronic bilateral low back pain with bilateral sciatica - Patient with long-standing bilateral low back pain with some bilateral sciatica with pain radiating to both knees - Exam is grossly normal today -Reviewed recent x-rays which show some DJD but no significant bony abnormality -Advised NSAIDs intermittently -Advised on back exercises for strengthening and stretching - Return precautions discussed  3. Tremor of left hand -No tremor noted today on exam and neuro exam is completely benign today - Unclear etiology as patient describes an intermittent focal tremor of her left hand that is both resting and intentional -We will send  to neurology for further evaluation - Ambulatory referral to Neurology  4. Tick bite, initial encounter - 3 small erythematous areas that were previous tick bites -No fevers or rashes or other symptoms of RMSF or Lyme disease - Discussed return precautions -No need for prophylactic antibiotics at this time   Return in about 2 months (around 08/29/2017) for Physical/AWV.   The entirety of the information documented in the History of Present Illness, Review of Systems and Physical Exam were personally obtained by me. Portions of this information were initially documented by Tiburcio Pea, CMA and reviewed by me for thoroughness and accuracy.    Virginia Crews, MD, MPH [facility] 06/26/2017 3:15 PM

## 2017-06-26 NOTE — Patient Instructions (Signed)

## 2017-07-05 DIAGNOSIS — H9319 Tinnitus, unspecified ear: Secondary | ICD-10-CM | POA: Diagnosis not present

## 2017-07-05 DIAGNOSIS — H903 Sensorineural hearing loss, bilateral: Secondary | ICD-10-CM | POA: Diagnosis not present

## 2017-07-05 DIAGNOSIS — H6123 Impacted cerumen, bilateral: Secondary | ICD-10-CM | POA: Diagnosis not present

## 2017-07-29 ENCOUNTER — Encounter (INDEPENDENT_AMBULATORY_CARE_PROVIDER_SITE_OTHER): Payer: Self-pay

## 2017-07-31 ENCOUNTER — Ambulatory Visit
Admission: RE | Admit: 2017-07-31 | Discharge: 2017-07-31 | Disposition: A | Discharge status: Home / Self Care | Payer: Medicare Other | Source: Ambulatory Visit | Attending: Internal Medicine | Admitting: Internal Medicine

## 2017-07-31 DIAGNOSIS — Z1231 Encounter for screening mammogram for malignant neoplasm of breast: Secondary | ICD-10-CM | POA: Diagnosis not present

## 2017-08-22 DIAGNOSIS — M79672 Pain in left foot: Secondary | ICD-10-CM | POA: Diagnosis not present

## 2017-08-22 DIAGNOSIS — M2011 Hallux valgus (acquired), right foot: Secondary | ICD-10-CM | POA: Diagnosis not present

## 2017-08-22 DIAGNOSIS — M2012 Hallux valgus (acquired), left foot: Secondary | ICD-10-CM | POA: Diagnosis not present

## 2017-08-22 DIAGNOSIS — M79671 Pain in right foot: Secondary | ICD-10-CM | POA: Diagnosis not present

## 2017-09-05 ENCOUNTER — Ambulatory Visit (INDEPENDENT_AMBULATORY_CARE_PROVIDER_SITE_OTHER): Payer: Medicare Other | Admitting: Family Medicine

## 2017-09-05 ENCOUNTER — Encounter: Payer: Self-pay | Admitting: Family Medicine

## 2017-09-05 ENCOUNTER — Ambulatory Visit (INDEPENDENT_AMBULATORY_CARE_PROVIDER_SITE_OTHER): Payer: Medicare Other

## 2017-09-05 VITALS — BP 124/64 | HR 70 | Temp 98.5°F | Ht 60.0 in | Wt 140.4 lb

## 2017-09-05 VITALS — BP 124/64 | HR 70 | Temp 98.5°F | Ht 60.0 in | Wt 140.0 lb

## 2017-09-05 DIAGNOSIS — R7303 Prediabetes: Secondary | ICD-10-CM

## 2017-09-05 DIAGNOSIS — E785 Hyperlipidemia, unspecified: Secondary | ICD-10-CM

## 2017-09-05 DIAGNOSIS — Z0000 Encounter for general adult medical examination without abnormal findings: Secondary | ICD-10-CM

## 2017-09-05 DIAGNOSIS — E039 Hypothyroidism, unspecified: Secondary | ICD-10-CM

## 2017-09-05 NOTE — Progress Notes (Signed)
Subjective:   Rebecca Patterson is a 83 y.o. female who presents for Medicare Annual (Subsequent) preventive examination.  Review of Systems:  N/A  Cardiac Risk Factors include: advanced age (>36men, >63 women);dyslipidemia     Objective:     Vitals: BP 124/64 (BP Location: Right Arm)   Pulse 70   Temp 98.5 F (36.9 C) (Oral)   Ht 5' (1.524 m)   Wt 140 lb 6.4 oz (63.7 kg)   BMI 27.42 kg/m   Body mass index is 27.42 kg/m.  Advanced Directives 09/05/2017 06/14/2017 08/29/2016 06/15/2016 11/16/2015 11/10/2014 11/05/2014  Does Patient Have a Medical Advance Directive? Yes Yes Yes Yes Yes No;Yes Yes  Type of Paramedic of [place];Living will Living will;Healthcare Power of [place];Living will [place];Living will Healthcare Power of [place];Living will [place];Living will  Does patient want to make changes to medical advance directive? - No - Patient declined - No - Patient declined No - Patient declined No - Patient declined No - Patient declined  Copy of [place] in Chart? No - copy requested No - copy requested No - copy requested No - copy requested No - copy requested No - copy requested No - copy requested    Tobacco Social History   Tobacco Use  Smoking Status Never Smoker  Smokeless Tobacco Never Used     Counseling given: Not Answered   Clinical Intake:  Pre-visit preparation completed: Yes  Pain : No/denies pain Pain Score: 0-No pain     Nutritional Status: BMI 25 -29 Overweight Nutritional Risks: None Diabetes: No  How often do you need to have someone help you when you read instructions, pamphlets, or other written materials from your doctor or pharmacy?: 1 - Never  Interpreter Needed?: No  Information entered by :: [facility], LPN  Past Medical History:  Diagnosis Date  . Allergy   . Breast cancer ([place]) 2013   Rt  . Cancer [facility]) 2013   Right Breast- radiation and Tamoxifen  . Complication of anesthesia   . Corn   . Family history of adverse reaction to anesthesia    sister - slow to wake  . Fibrocystic breast   . GERD (gastroesophageal reflux disease)   . Hyperlipidemia   . Osteoporosis   . Osteoporosis   . PONV (postoperative nausea and vomiting)   . Thyroid disease   . Vertigo    long ago  . Wears dentures    full upper and lower  . Wears hearing aid    bilateral   Past Surgical History:  Procedure Laterality Date  . ABDOMINAL HYSTERECTOMY    . BREAST BIOPSY Right 2013   Stereo- Positive  . BREAST EXCISIONAL BIOPSY Right 2013  . BREAST LUMPECTOMY Right    2013  . BUNIONECTOMY Bilateral 2004  . 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 CATARACT EXTRACTION W/PHACO Right 11/10/2014   Procedure: CATARACT EXTRACTION PHACO AND INTRAOCULAR LENS PLACEMENT (IOC);  Surgeon: Leandrew Koyanagi, MD;  Location: [place];  Service: Ophthalmology;  Laterality: Right;  . CHOLECYSTECTOMY    . KNEE SURGERY Right 2005  . Mastoid surgery Right   . SKIN CANCER EXCISION  03/17/2014   right side of face Dr. Aubery Lapping  . THYROID SURGERY N/A 1980   Goiter   Family History  Problem Relation Age of Onset  . Stroke Sister   . Breast cancer Sister 21  .  Cancer Sister        breast   . Pneumonia Other    Social History   Socioeconomic History  . Marital status: Married    Spouse name: Juanda Crumble  . Number of children: 2  . Years of education: Not on file  . Highest education level: 12th grade  Occupational History  . Occupation: retired  Scientific laboratory technician  . Financial resource strain: Not hard at all  . Food insecurity:    Worry: Never true    Inability: Never true  . Transportation needs:    Medical: No    Non-medical: No  Tobacco Use  . Smoking status: Never Smoker  . Smokeless tobacco: Never Used  Substance and Sexual Activity  . Alcohol use: No  . Drug use: No    . Sexual activity: Not on file  Lifestyle  . Physical activity:    Days per week: Not on file    Minutes per session: Not on file  . Stress: To some extent  Relationships  . Social connections:    Talks on phone: Not on file    Gets together: Not on file    Attends religious service: Not on file    Active member of club or organization: Not on file    Attends meetings of clubs or organizations: Not on file    Relationship status: Not on file  Other Topics Concern  . Not on file  Social History Narrative  . Not on file    Outpatient Encounter Medications as of 09/05/2017  Medication Sig  . aspirin EC 81 MG tablet Take 81 mg by mouth daily.  . calcium carbonate (OS-CAL) 600 MG TABS tablet Take 600 mg by mouth daily with breakfast.   . docusate sodium (COLACE) 100 MG capsule Take by mouth daily.  . famotidine (PEPCID) 20 MG tablet Take 20 mg by mouth as needed.   Marland Kitchen ketoconazole (NIZORAL) 2 % cream Apply 1 application topically daily as needed.   Marland Kitchen levothyroxine (SYNTHROID, LEVOTHROID) 50 MCG tablet TAKE 1 TABLET BY MOUTH DAILY  . Multiple Vitamins-Minerals (PRESERVISION AREDS 2 PO) Take by mouth.  . nystatin cream (MYCOSTATIN) APPLY TO AFFECTED AREA TWICE A DAY AS NEEDED  . Omega-3 Fatty Acids (FISH OIL DOUBLE STRENGTH) 1200 MG CAPS Take 1 capsule by mouth daily.   . polyethylene glycol (MIRALAX / GLYCOLAX) packet Take 17 g by mouth daily.  . pravastatin (PRAVACHOL) 40 MG tablet TAKE 1 TABLET (40 MG TOTAL) BY MOUTH DAILY.  Marland Kitchen triamcinolone cream (KENALOG) 0.1 % APPLY TO AFFECTED AREA 3 TIMES A DAY FOR INSECT BITES (Patient taking differently: APPLY TO AFFECTED AREA 3 TIMES A DAY FOR INSECT BITES as needed)   No facility-administered encounter medications on file as of 09/05/2017.     Activities of Daily Living In your present state of health, do you have any difficulty performing the following activities: 09/05/2017  Hearing? Y  Comment Wears bilateral hearing aids.   Difficulty  concentrating or making decisions? N  Walking or climbing stairs? N  Dressing or bathing? N  Doing errands, shopping? N  Preparing Food and eating ? N  Using the Toilet? N  In the past six months, have you accidently leaked urine? N  Do you have problems with loss of bowel control? N  Managing your Medications? N  Managing your Finances? N  Housekeeping or managing your Housekeeping? N  Some recent data might be hidden    Patient Care Team: Virginia Crews,  MD as PCP - General (Family Medicine) Leandrew Koyanagi, MD as Referring Physician (Ophthalmology) Cammie Sickle, MD as Consulting Physician (Internal Medicine) Samara Deist, DPM as Referring Physician (Podiatry)    Assessment:   This is a routine wellness examination for [facility].  Exercise Activities and Dietary recommendations Current Exercise Habits: The patient does not participate in regular exercise at present, Exercise limited by: None identified  Goals    . Exercise 150 minutes per week (moderate activity)       Fall Risk Fall Risk  09/05/2017 08/29/2016 08/18/2015 08/18/2015 08/17/2014  Falls in the past year? No No No No No   Is the patient's home free of loose throw rugs in walkways, pet beds, electrical cords, etc?   yes      Grab bars in the bathroom? yes      Handrails on the stairs?   no      Adequate lighting?   yes  Timed Get Up and Go performed: N/A  Depression Screen PHQ 2/9 Scores 09/05/2017 09/05/2017 08/29/2016 08/29/2016  PHQ - 2 Score 0 0 0 0  PHQ- 9 Score 0 - 0 -     Cognitive Function: Pt declined screening today.     6CIT Screen 08/29/2016  What Year? 0 points  What month? 0 points  What time? 0 points  Count back from 20 0 points  Months in reverse 2 points  Repeat phrase 0 points  Total Score 2    Immunization History  Administered Date(s) Administered  . Influenza, High Dose Seasonal PF 11/11/2015, 10/10/2016  . Influenza,inj,Quad PF,6+ Mos 02/18/2015  .  Influenza-Unspecified 10/06/2013  . Pneumococcal Conjugate-13 10/21/2013  . Pneumococcal Polysaccharide-23 01/20/2010  . Td 09/03/2005    Qualifies for Shingles Vaccine? Due for Shingles vaccine. Declined my offer to administer today. Education has been provided regarding the importance of this vaccine. Pt has been advised to call her insurance company to determine her out of pocket expense. Advised she may also receive this vaccine at her local pharmacy or Health Dept. Verbalized acceptance and understanding.  Screening Tests Health Maintenance  Topic Date Due  . FOOT EXAM  01/02/1945  . URINE MICROALBUMIN  01/02/1945  . HEMOGLOBIN A1C  03/01/2017  . OPHTHALMOLOGY EXAM  05/06/2017  . INFLUENZA VACCINE  09/05/2017  . TETANUS/TDAP  02/05/2026 (Originally 09/04/2015)  . DEXA SCAN  Completed  . PNA vac Low Risk Adult  Completed    Cancer Screenings: Lung: Low Dose CT Chest recommended if Age 62-80 years, 30 pack-year currently smoking OR have quit w/in 15years. Patient does not qualify. Breast:  Up to date on Mammogram? Yes   Up to date of Bone Density/Dexa? Yes Colorectal: N/A  Additional Screenings:  Hepatitis C Screening: N/A     Plan:  I have personally reviewed and addressed the Medicare Annual Wellness questionnaire and have noted the following in the patient's chart:  A. Medical and social history B. Use of alcohol, tobacco or illicit drugs  C. Current medications and supplements D. Functional ability and status E.  Nutritional status F.  Physical activity G. Advance directives H. List of other physicians I.  Hospitalizations, surgeries, and ER visits in previous 12 months J.  [place] such as hearing and vision if needed, cognitive and depression L. Referrals and appointments - none  In addition, I have reviewed and discussed with patient certain preventive protocols, quality metrics, and best practice recommendations. A written personalized care plan for  preventive services as well  as general preventive health recommendations were provided to patient.  See attached scanned questionnaire for additional information.   Signed,  Fabio Neighbors, LPN Nurse Health Advisor   Nurse Recommendations: Per HM pt is due for a diabetic foot exam, urine microalbumin and A1c checked. Pt states she is NOT a diabetic. Please advise on gaps in care. Pt states she has had an eye exam this year. Will contact [facility] for OV date. Pt declined the tetanus vaccine today.

## 2017-09-05 NOTE — Progress Notes (Signed)
Patient: Rebecca Patterson, Female    DOB: 1934-09-19, 83 y.o.   MRN: [id number] Visit Date: 09/05/2017  Today's Provider: Lavon Paganini, MD   I, Martha Clan, CMA, am acting as scribe for Lavon Paganini, MD.  Chief Complaint  Patient presents with  . Annual Exam   Subjective:     Complete Physical Rebecca Patterson is a 83 y.o. female. She feels well. She reports exercising when she walks across the field to her daughter's house. She reports she is sleeping fairly well.   She is seeing Dr. Manuella Ghazi next week for tremors in hands intermittently.  She is followed by Oncology or breast cancer and is UTD on mammogram. -----------------------------------------------------------   Review of Systems  Constitutional: Negative.   HENT: Positive for hearing loss. Negative for congestion, dental problem, drooling, ear discharge, ear pain, facial swelling, mouth sores, nosebleeds, postnasal drip, rhinorrhea, sinus pressure, sinus pain, sneezing, sore throat, tinnitus, trouble swallowing and voice change.   Eyes: Negative.   Respiratory: Negative.   Cardiovascular: Negative.   Gastrointestinal: Negative.   Endocrine: Negative.   Genitourinary: Negative.   Musculoskeletal: Negative.   Skin: Negative.   Allergic/Immunologic: Negative.   Neurological: Negative.   Hematological: Negative.   Psychiatric/Behavioral: Negative.     Social History   Socioeconomic History  . Marital status: Married    Spouse name: Juanda Crumble  . Number of children: 2  . Years of education: Not on file  . Highest education level: 12th grade  Occupational History  . Occupation: retired  Scientific laboratory technician  . Financial resource strain: Not hard at all  . Food insecurity:    Worry: Never true    Inability: Never true  . Transportation needs:    Medical: No    Non-medical: No  Tobacco Use  . Smoking status: Never Smoker  . Smokeless tobacco: Never Used  Substance and Sexual Activity  . Alcohol  use: No  . Drug use: No  . Sexual activity: Not on file  Lifestyle  . Physical activity:    Days per week: Not on file    Minutes per session: Not on file  . Stress: To some extent  Relationships  . Social connections:    Talks on phone: Not on file    Gets together: Not on file    Attends religious service: Not on file    Active member of club or organization: Not on file    Attends meetings of clubs or organizations: Not on file    Relationship status: Not on file  . Intimate partner violence:    Fear of current or ex partner: Not on file    Emotionally abused: Not on file    Physically abused: Not on file    Forced sexual activity: Not on file  Other Topics Concern  . Not on file  Social History Narrative  . Not on file    Past Medical History:  Diagnosis Date  . Allergy   . Breast cancer ([place]) 2013   Rt  . Cancer [facility]) 2013   Right Breast- radiation and Tamoxifen  . Complication of anesthesia   . Corn   . Family history of adverse reaction to anesthesia    sister - slow to wake  . Fibrocystic breast   . GERD (gastroesophageal reflux disease)   . Hyperlipidemia   . Osteoporosis   . Osteoporosis   . PONV (postoperative nausea and vomiting)   . Thyroid disease   .  Vertigo    long ago  . Wears dentures    full upper and lower  . Wears hearing aid    bilateral     Patient Active Problem List   Diagnosis Date Noted  . Hypothyroidism, goitrous 06/26/2017  . Carcinoma of overlapping sites of right breast in female, estrogen receptor positive ([place]) 11/16/2015  . Awareness of heartbeats 08/17/2014  . 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 Cardiac conduction disorder 06/17/2014  . Pre-diabetes 06/17/2014  . HLD (hyperlipidemia) 06/17/2014  . Adult hypothyroidism 06/17/2014  . Osteoporosis 06/17/2014  . 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 Avitaminosis D 06/17/2014    Past Surgical History:  Procedure Laterality Date  . ABDOMINAL HYSTERECTOMY    . APPENDECTOMY    .  BREAST BIOPSY Right 2013   Stereo- Positive  . BREAST EXCISIONAL BIOPSY Right 2013  . BREAST LUMPECTOMY Right    2013  . BUNIONECTOMY Bilateral 2004  . 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 CATARACT EXTRACTION W/PHACO Right 11/10/2014   Procedure: CATARACT EXTRACTION PHACO AND INTRAOCULAR LENS PLACEMENT (IOC);  Surgeon: Leandrew Koyanagi, MD;  Location: [place];  Service: Ophthalmology;  Laterality: Right;  . CHOLECYSTECTOMY    . KNEE SURGERY Right 2005  . Mastoid surgery Right   . SKIN CANCER EXCISION  03/17/2014   right side of face Dr. Aubery Lapping  . THYROID SURGERY N/A 1980   Goiter    Her family history includes Breast cancer (age of onset: 4) in her sister; Stroke in her sister. There is no history of Colon cancer.      Current Outpatient Medications:  .  aspirin EC 81 MG tablet, Take 81 mg by mouth daily., Disp: , Rfl:  .  calcium carbonate (OS-CAL) 600 MG TABS tablet, Take 600 mg by mouth daily with breakfast. , Disp: , Rfl:  .  docusate sodium (COLACE) 100 MG capsule, Take by mouth daily., Disp: , Rfl:  .  famotidine (PEPCID) 20 MG tablet, Take 20 mg by mouth as needed. , Disp: , Rfl:  .  ketoconazole (NIZORAL) 2 % cream, Apply 1 application topically daily as needed. , Disp: , Rfl:  .  levothyroxine (SYNTHROID, LEVOTHROID) 50 MCG tablet, TAKE 1 TABLET BY MOUTH DAILY, Disp: 90 tablet, Rfl: 3 .  Multiple Vitamins-Minerals (PRESERVISION AREDS 2 PO), Take by mouth., Disp: , Rfl:  .  nystatin cream (MYCOSTATIN), APPLY TO AFFECTED AREA TWICE A DAY AS NEEDED, Disp: 60 g, Rfl: 2 .  Omega-3 Fatty Acids (FISH OIL DOUBLE STRENGTH) 1200 MG CAPS, Take 1 capsule by mouth daily. , Disp: , Rfl:  .  polyethylene glycol (MIRALAX / GLYCOLAX) packet, Take 17 g by mouth daily., Disp: , Rfl:  .  pravastatin (PRAVACHOL) 40 MG tablet, TAKE 1 TABLET (40 MG TOTAL) BY MOUTH DAILY., Disp: 90 tablet, Rfl: 4 .  triamcinolone cream (KENALOG) 0.1 %, APPLY TO AFFECTED AREA 3 TIMES  A DAY FOR INSECT BITES (Patient taking differently: APPLY TO AFFECTED AREA 3 TIMES A DAY FOR INSECT BITES as needed), Disp: 60 g, Rfl: 5  Patient Care Team: Virginia Crews, MD as PCP - General (Family Medicine) Leandrew Koyanagi, MD as Referring Physician (Ophthalmology) Cammie Sickle, MD as Consulting Physician (Internal Medicine) Samara Deist, DPM as Referring Physician (Podiatry)     Objective:   Vitals: BP 124/64   Pulse 70   Temp 98.5 F (36.9 C)   Ht 5' (1.524 m)   Wt 140 lb (63.5 kg)   BMI 27.34 kg/m  Physical Exam  Constitutional: She is oriented to person, place, and time. She appears well-developed and well-nourished. No distress.  HENT:  Head: Normocephalic and atraumatic.  Right Ear: External ear normal.  Left Ear: External ear normal.  Nose: Nose normal.  Mouth/Throat: Oropharynx is clear and moist. No oropharyngeal exudate.  Eyes: Pupils are equal, round, and reactive to light. Conjunctivae and EOM are normal. No scleral icterus.  Neck: Neck supple. No thyromegaly present.  Cardiovascular: Normal rate, regular rhythm, normal heart sounds and intact distal pulses.  No murmur heard. Pulmonary/Chest: Effort normal and breath sounds normal. No respiratory distress. She has no wheezes. She has no rales.  Abdominal: Soft. Bowel sounds are normal. She exhibits no distension. There is no tenderness. There is no rebound and no guarding.  Musculoskeletal: She exhibits no edema or deformity.  Lymphadenopathy:    She has no cervical adenopathy.  Neurological: She is alert and oriented to person, place, and time. She has normal strength. She displays no atrophy. No cranial nerve deficit or sensory deficit. She exhibits normal muscle tone. Gait normal.  Skin: Skin is warm and dry. Capillary refill takes less than 2 seconds. No rash noted.  Psychiatric: She has a normal mood and affect. Her behavior is normal.  Vitals reviewed.   Activities of Daily  Living In your present state of health, do you have any difficulty performing the following activities: 09/05/2017  Hearing? Y  Comment Wears bilateral hearing aids.   Difficulty concentrating or making decisions? N  Walking or climbing stairs? N  Dressing or bathing? N  Doing errands, shopping? N  Preparing Food and eating ? N  Using the Toilet? N  In the past six months, have you accidently leaked urine? N  Do you have problems with loss of bowel control? N  Managing your Medications? N  Managing your Finances? N  Housekeeping or managing your Housekeeping? N  Some recent data might be hidden    Fall Risk Assessment Fall Risk  09/05/2017 08/29/2016 08/18/2015 08/18/2015 08/17/2014  Falls in the past year? No No No No No     Depression Screen PHQ 2/9 Scores 09/05/2017 09/05/2017 08/29/2016 08/29/2016  PHQ - 2 Score 0 0 0 0  PHQ- 9 Score 0 - 0 -    6CIT Screen 08/29/2016  What Year? 0 points  What month? 0 points  What time? 0 points  Count back from 20 0 points  Months in reverse 2 points  Repeat phrase 0 points  Total Score 2     Assessment & Plan:    Annual Physical Reviewed patient's Family Medical History Reviewed and updated list of patient's medical providers Assessment of cognitive impairment was done Assessed patient's functional ability Established a written schedule for health screening [place] Completed and Reviewed  Exercise Activities and Dietary recommendations Goals    . Exercise 150 minutes per week (moderate activity)       Immunization History  Administered Date(s) Administered  . Influenza, High Dose Seasonal PF 11/11/2015, 10/10/2016  . Influenza,inj,Quad PF,6+ Mos 02/18/2015  . Influenza-Unspecified 10/06/2013  . Pneumococcal Conjugate-13 10/21/2013  . Pneumococcal Polysaccharide-23 01/20/2010  . Td 09/03/2005    Health Maintenance  Topic Date Due  . FOOT EXAM  01/02/1945  . URINE MICROALBUMIN  01/02/1945  .  HEMOGLOBIN A1C  03/01/2017  . OPHTHALMOLOGY EXAM  05/06/2017  . INFLUENZA VACCINE  09/05/2017  . TETANUS/TDAP  02/05/2026 (Originally 09/04/2015)  . DEXA SCAN  Completed  . PNA  vac Low Risk Adult  Completed     Discussed health benefits of physical activity, and encouraged her to engage in regular exercise appropriate for her age and condition.    ------------------------------------------------------------------------------------------------------------  Problem List Items Addressed This Visit      Endocrine   Adult hypothyroidism   Relevant Orders   TSH     Other   Pre-diabetes   Relevant Orders   Hemoglobin A1c   HLD (hyperlipidemia)   Relevant Orders   Lipid panel   Comprehensive metabolic panel    Other Visit Diagnoses    Encounter for annual physical exam    -  Primary       Return in about 6 months (around 03/08/2018) for chronic disease f/u.   The entirety of the information documented in the History of Present Illness, Review of Systems and Physical Exam were personally obtained by me. Portions of this information were initially documented by Raquel Sarna Ratchford, CMA and reviewed by me for thoroughness and accuracy.    Virginia Crews, MD, MPH [facility] 09/05/2017 11:17 AM

## 2017-09-05 NOTE — Patient Instructions (Signed)
Rebecca Patterson , Thank you for taking time to come for your Medicare Wellness Visit. I appreciate your ongoing commitment to your health goals. Please review the following plan we discussed and let me know if I can assist you in the future.   Screening recommendations/referrals: Colonoscopy: N/A Mammogram: Up to date Bone Density: Up to date Recommended yearly ophthalmology/optometry visit for glaucoma screening and checkup Recommended yearly dental visit for hygiene and checkup  Vaccinations: Influenza vaccine: Up to date Pneumococcal vaccine: Up to date Tdap vaccine: Pt declines today.  Shingles vaccine: Pt declines today.     Advanced directives: Please bring a copy of your POA (Power of Attorney) and/or Living Will to your next appointment.   Conditions/risks identified: Recommend light weight bearing exercises for 30 minutes 3 times a week.  Next appointment: 11:00 AM today with Dr Brita Romp.    Preventive Care 13 Years and Older, Female Preventive care refers to lifestyle choices and visits with your health care provider that can promote health and wellness. What does preventive care include?  A yearly physical exam. This is also called an annual well check.  Dental exams once or twice a year.  Routine eye exams. Ask your health care provider how often you should have your eyes checked.  Personal lifestyle choices, including:  Daily care of your teeth and gums.  Regular physical activity.  Eating a healthy diet.  Avoiding tobacco and drug use.  Limiting alcohol use.  Practicing safe sex.  Taking low-dose aspirin every day.  Taking vitamin and mineral supplements as recommended by your health care provider. What happens during an annual well check? The services and screenings done by your health care provider during your annual well check will depend on your age, overall health, lifestyle risk factors, and family history of disease. Counseling  Your health care  provider may ask you questions about your:  Alcohol use.  Tobacco use.  Drug use.  Emotional well-being.  Home and relationship well-being.  Sexual activity.  Eating habits.  History of falls.  Memory and ability to understand (cognition).  Work and work Statistician.  Reproductive health. Screening  You may have the following tests or measurements:  Height, weight, and BMI.  Blood pressure.  Lipid and cholesterol levels. These may be checked every 5 years, or more frequently if you are over 26 years old.  Skin check.  Lung cancer screening. You may have this screening every year starting at age 75 if you have a 30-pack-year history of smoking and currently smoke or have quit within the past 15 years.  Fecal occult blood test (FOBT) of the stool. You may have this test every year starting at age 34.  Flexible sigmoidoscopy or colonoscopy. You may have a sigmoidoscopy every 5 years or a colonoscopy every 10 years starting at age 44.  Hepatitis C blood test.  Hepatitis B blood test.  Sexually transmitted disease (STD) testing.  Diabetes screening. This is done by checking your blood sugar (glucose) after you have not eaten for a while (fasting). You may have this done every 1-3 years.  Bone density scan. This is done to screen for osteoporosis. You may have this done starting at age 85.  Mammogram. This may be done every 1-2 years. Talk to your health care provider about how often you should have regular mammograms. Talk with your health care provider about your test results, treatment options, and if necessary, the need for more tests. Vaccines  Your health care provider may  recommend certain vaccines, such as:  Influenza vaccine. This is recommended every year.  Tetanus, diphtheria, and acellular pertussis (Tdap, Td) vaccine. You may need a Td booster every 10 years.  Zoster vaccine. You may need this after age 38.  Pneumococcal 13-valent conjugate (PCV13)  vaccine. One dose is recommended after age 8.  Pneumococcal polysaccharide (PPSV23) vaccine. One dose is recommended after age 27. Talk to your health care provider about which screenings and vaccines you need and how often you need them. This information is not intended to replace advice given to you by your health care provider. Make sure you discuss any questions you have with your health care provider. Document Released: 02/18/2015 Document Revised: 10/12/2015 Document Reviewed: 11/23/2014 Elsevier Interactive Patient Education  2017 [place] Prevention in the Home Falls can cause injuries. They can happen to people of all ages. There are many things you can do to make your home safe and to help prevent falls. What can I do on the outside of my home?  Regularly fix the edges of walkways and driveways and fix any cracks.  Remove anything that might make you trip as you walk through a door, such as a raised step or threshold.  Trim any bushes or trees on the path to your home.  Use bright outdoor lighting.  Clear any walking paths of anything that might make someone trip, such as rocks or tools.  Regularly check to see if handrails are loose or broken. Make sure that both sides of any steps have handrails.  Any raised decks and porches should have guardrails on the edges.  Have any leaves, snow, or ice cleared regularly.  Use sand or salt on walking paths during winter.  Clean up any spills in your garage right away. This includes oil or grease spills. What can I do in the bathroom?  Use night lights.  Install grab bars by the toilet and in the tub and shower. Do not use towel bars as grab bars.  Use non-skid mats or decals in the tub or shower.  If you need to sit down in the shower, use a plastic, non-slip stool.  Keep the floor dry. Clean up any water that spills on the floor as soon as it happens.  Remove soap buildup in the tub or shower  regularly.  Attach bath mats securely with double-sided non-slip rug tape.  Do not have throw rugs and other things on the floor that can make you trip. What can I do in the bedroom?  Use night lights.  Make sure that you have a light by your bed that is easy to reach.  Do not use any sheets or blankets that are too big for your bed. They should not hang down onto the floor.  Have a firm chair that has side arms. You can use this for support while you get dressed.  Do not have throw rugs and other things on the floor that can make you trip. What can I do in the kitchen?  Clean up any spills right away.  Avoid walking on wet floors.  Keep items that you use a lot in easy-to-reach places.  If you need to reach something above you, use a strong step stool that has a grab bar.  Keep electrical cords out of the way.  Do not use floor polish or wax that makes floors slippery. If you must use wax, use non-skid floor wax.  Do not have throw rugs and  other things on the floor that can make you trip. What can I do with my stairs?  Do not leave any items on the stairs.  Make sure that there are handrails on both sides of the stairs and use them. Fix handrails that are broken or loose. Make sure that handrails are as long as the stairways.  Check any carpeting to make sure that it is firmly attached to the stairs. Fix any carpet that is loose or worn.  Avoid having throw rugs at the top or bottom of the stairs. If you do have throw rugs, attach them to the floor with carpet tape.  Make sure that you have a light switch at the top of the stairs and the bottom of the stairs. If you do not have them, ask someone to add them for you. What else can I do to help prevent falls?  Wear shoes that:  Do not have high heels.  Have rubber bottoms.  Are comfortable and fit you well.  Are closed at the toe. Do not wear sandals.  If you use a stepladder:  Make sure that it is fully  opened. Do not climb a closed stepladder.  Make sure that both sides of the stepladder are locked into place.  Ask someone to hold it for you, if possible.  Clearly mark and make sure that you can see:  Any grab bars or handrails.  First and last steps.  Where the edge of each step is.  Use tools that help you move around (mobility aids) if they are needed. These include:  Canes.  Walkers.  Scooters.  Crutches.  Turn on the lights when you go into a dark area. Replace any light bulbs as soon as they burn out.  Set up your furniture so you have a clear path. Avoid moving your furniture around.  If any of your floors are uneven, fix them.  If there are any pets around you, be aware of where they are.  Review your medicines with your doctor. Some medicines can make you feel dizzy. This can increase your chance of falling. Ask your doctor what other things that you can do to help prevent falls. This information is not intended to replace advice given to you by your health care provider. Make sure you discuss any questions you have with your health care provider. Document Released: 11/18/2008 Document Revised: 06/30/2015 Document Reviewed: 02/26/2014 Elsevier Interactive Patient Education  2017 [employer].

## 2017-09-05 NOTE — Patient Instructions (Signed)
Preventive Care 65 Years and Older, Female Preventive care refers to lifestyle choices and visits with your health care provider that can promote health and wellness. What does preventive care include?  A yearly physical exam. This is also called an annual well check.  Dental exams once or twice a year.  Routine eye exams. Ask your health care provider how often you should have your eyes checked.  Personal lifestyle choices, including: ? Daily care of your teeth and gums. ? Regular physical activity. ? Eating a healthy diet. ? Avoiding tobacco and drug use. ? Limiting alcohol use. ? Practicing safe sex. ? Taking low-dose aspirin every day. ? Taking vitamin and mineral supplements as recommended by your health care provider. What happens during an annual well check? The services and screenings done by your health care provider during your annual well check will depend on your age, overall health, lifestyle risk factors, and family history of disease. Counseling Your health care provider may ask you questions about your:  Alcohol use.  Tobacco use.  Drug use.  Emotional well-being.  Home and relationship well-being.  Sexual activity.  Eating habits.  History of falls.  Memory and ability to understand (cognition).  Work and work environment.  Reproductive health.  Screening You may have the following tests or measurements:  Height, weight, and BMI.  Blood pressure.  Lipid and cholesterol levels. These may be checked every 5 years, or more frequently if you are over 50 years old.  Skin check.  Lung cancer screening. You may have this screening every year starting at age 55 if you have a 30-pack-year history of smoking and currently smoke or have quit within the past 15 years.  Fecal occult blood test (FOBT) of the stool. You may have this test every year starting at age 50.  Flexible sigmoidoscopy or colonoscopy. You may have a sigmoidoscopy every 5 years or  a colonoscopy every 10 years starting at age 50.  Hepatitis C blood test.  Hepatitis B blood test.  Sexually transmitted disease (STD) testing.  Diabetes screening. This is done by checking your blood sugar (glucose) after you have not eaten for a while (fasting). You may have this done every 1-3 years.  Bone density scan. This is done to screen for osteoporosis. You may have this done starting at age 65.  Mammogram. This may be done every 1-2 years. Talk to your health care provider about how often you should have regular mammograms.  Talk with your health care provider about your test results, treatment options, and if necessary, the need for more tests. Vaccines Your health care provider may recommend certain vaccines, such as:  Influenza vaccine. This is recommended every year.  Tetanus, diphtheria, and acellular pertussis (Tdap, Td) vaccine. You may need a Td booster every 10 years.  Varicella vaccine. You may need this if you have not been vaccinated.  Zoster vaccine. You may need this after age 60.  Measles, mumps, and rubella (MMR) vaccine. You may need at least one dose of MMR if you were born in 1957 or later. You may also need a second dose.  Pneumococcal 13-valent conjugate (PCV13) vaccine. One dose is recommended after age 65.  Pneumococcal polysaccharide (PPSV23) vaccine. One dose is recommended after age 65.  Meningococcal vaccine. You may need this if you have certain conditions.  Hepatitis A vaccine. You may need this if you have certain conditions or if you travel or work in places where you may be exposed to hepatitis   A.  Hepatitis B vaccine. You may need this if you have certain conditions or if you travel or work in places where you may be exposed to hepatitis B.  Haemophilus influenzae type b (Hib) vaccine. You may need this if you have certain conditions.  Talk to your health care provider about which screenings and vaccines you need and how often you  need them. This information is not intended to replace advice given to you by your health care provider. Make sure you discuss any questions you have with your health care provider. Document Released: 02/18/2015 Document Revised: 10/12/2015 Document Reviewed: 11/23/2014 Elsevier Interactive Patient Education  2018 [employer] Inc[employer]  

## 2017-09-06 ENCOUNTER — Telehealth: Payer: Self-pay

## 2017-09-06 LAB — COMPREHENSIVE METABOLIC PANEL
A/G RATIO: 2.2 (ref 1.2–2.2)
ALT: 14 IU/L (ref 0–32)
AST: 15 IU/L (ref 0–40)
Albumin: 4.7 g/dL (ref 3.5–4.7)
Alkaline Phosphatase: 49 IU/L (ref 39–117)
BUN/Creatinine Ratio: 15 (ref 12–28)
BUN: 12 mg/dL (ref 8–27)
Bilirubin Total: 0.7 mg/dL (ref 0.0–1.2)
CALCIUM: 9.1 mg/dL (ref 8.7–10.3)
CO2: 27 mmol/L (ref 20–29)
Chloride: 100 mmol/L (ref 96–106)
Creatinine, Ser: 0.81 mg/dL (ref 0.57–1.00)
GFR, EST AFRICAN AMERICAN: 79 mL/min/{1.73_m2} (ref >59)
GFR, EST NON AFRICAN AMERICAN: 69 mL/min/{1.73_m2} (ref >59)
GLOBULIN, TOTAL: 2.1 g/dL (ref 1.5–4.5)
Glucose: 119 mg/dL — ABNORMAL HIGH (ref 65–99)
POTASSIUM: 4.4 mmol/L (ref 3.5–5.2)
SODIUM: 141 mmol/L (ref 134–144)
TOTAL PROTEIN: 6.8 g/dL (ref 6.0–8.5)

## 2017-09-06 LAB — HEMOGLOBIN A1C
Est. average glucose Bld gHb Est-mCnc: 140 mg/dL
Hgb A1c MFr Bld: 6.5 % — ABNORMAL HIGH (ref 4.8–5.6)

## 2017-09-06 LAB — LIPID PANEL
CHOLESTEROL TOTAL: 162 mg/dL (ref 100–199)
Chol/HDL Ratio: 2.2 ratio (ref 0.0–4.4)
HDL: 75 mg/dL (ref >39)
LDL Calculated: 61 mg/dL (ref 0–99)
Triglycerides: 129 mg/dL (ref 0–149)
VLDL CHOLESTEROL CAL: 26 mg/dL (ref 5–40)

## 2017-09-06 LAB — TSH: TSH: 1.54 u[IU]/mL (ref 0.450–4.500)

## 2017-09-06 NOTE — Telephone Encounter (Signed)
-----   Message from Virginia Crews, MD sent at 09/06/2017  8:22 AM EDT ----- Normal cholesterol, kidney function, liver function, electrolytes, Thyroid function.  A1c (3 month avg of blood sugars) is now in the diabetic range at 6.5.  It is especially important to exercise and eat low carb diet.  We should f/u in 3 months, recheck A1c, and discuss diabetes medication if it is not down.  Virginia Crews, MD, MPH [facility] 09/06/2017 8:22 AM

## 2017-09-06 NOTE — Telephone Encounter (Signed)
Pt advised and FU made.

## 2017-09-09 ENCOUNTER — Other Ambulatory Visit (HOSPITAL_COMMUNITY): Payer: Self-pay | Admitting: Neurology

## 2017-09-09 DIAGNOSIS — G20 Parkinson's disease: Secondary | ICD-10-CM | POA: Insufficient documentation

## 2017-09-09 DIAGNOSIS — G20A1 Parkinson's disease without dyskinesia, without mention of fluctuations: Secondary | ICD-10-CM | POA: Insufficient documentation

## 2017-09-09 DIAGNOSIS — E559 Vitamin D deficiency, unspecified: Secondary | ICD-10-CM | POA: Diagnosis not present

## 2017-09-09 DIAGNOSIS — Z79899 Other long term (current) drug therapy: Secondary | ICD-10-CM | POA: Diagnosis not present

## 2017-09-09 DIAGNOSIS — E538 Deficiency of other specified B group vitamins: Secondary | ICD-10-CM | POA: Diagnosis not present

## 2017-09-13 ENCOUNTER — Ambulatory Visit (HOSPITAL_COMMUNITY)
Admission: RE | Admit: 2017-09-13 | Discharge: 2017-09-13 | Disposition: A | Discharge status: Home / Self Care | Payer: Medicare Other | Source: Ambulatory Visit | Attending: Neurology | Admitting: Neurology

## 2017-09-13 DIAGNOSIS — G20 Parkinson's disease: Secondary | ICD-10-CM | POA: Diagnosis not present

## 2017-09-13 DIAGNOSIS — R410 Disorientation, unspecified: Secondary | ICD-10-CM | POA: Diagnosis not present

## 2017-11-11 DIAGNOSIS — H353132 Nonexudative age-related macular degeneration, bilateral, intermediate dry stage: Secondary | ICD-10-CM | POA: Diagnosis not present

## 2017-11-11 LAB — HM DIABETES EYE EXAM

## 2017-12-09 ENCOUNTER — Ambulatory Visit
Admission: RE | Admit: 2017-12-09 | Discharge: 2017-12-09 | Disposition: A | Discharge status: Home / Self Care | Payer: Medicare Other | Source: Ambulatory Visit | Attending: Family Medicine | Admitting: Family Medicine

## 2017-12-09 ENCOUNTER — Encounter: Payer: Self-pay | Admitting: Family Medicine

## 2017-12-09 ENCOUNTER — Ambulatory Visit (INDEPENDENT_AMBULATORY_CARE_PROVIDER_SITE_OTHER): Payer: Medicare Other | Admitting: Family Medicine

## 2017-12-09 VITALS — BP 130/77 | HR 61 | Temp 97.6°F | Wt 144.2 lb

## 2017-12-09 DIAGNOSIS — W19XXXA Unspecified fall, initial encounter: Secondary | ICD-10-CM | POA: Diagnosis not present

## 2017-12-09 DIAGNOSIS — R0989 Other specified symptoms and signs involving the circulatory and respiratory systems: Secondary | ICD-10-CM | POA: Diagnosis not present

## 2017-12-09 DIAGNOSIS — M25521 Pain in right elbow: Secondary | ICD-10-CM | POA: Insufficient documentation

## 2017-12-09 DIAGNOSIS — S59901A Unspecified injury of right elbow, initial encounter: Secondary | ICD-10-CM | POA: Diagnosis not present

## 2017-12-09 DIAGNOSIS — E119 Type 2 diabetes mellitus without complications: Secondary | ICD-10-CM | POA: Diagnosis not present

## 2017-12-09 DIAGNOSIS — E785 Hyperlipidemia, unspecified: Secondary | ICD-10-CM

## 2017-12-09 DIAGNOSIS — Z23 Encounter for immunization: Secondary | ICD-10-CM

## 2017-12-09 LAB — POCT UA - MICROALBUMIN: MICROALBUMIN (UR) POC: 50 mg/L

## 2017-12-09 LAB — POCT GLYCOSYLATED HEMOGLOBIN (HGB A1C): HEMOGLOBIN A1C: 6.5 % — AB (ref 4.0–5.6)

## 2017-12-09 MED ORDER — METFORMIN HCL 500 MG PO TABS: 500.0000 mg | ORAL_TABLET | Freq: Two times a day (BID) | ORAL | 3 refills | Status: DC

## 2017-12-09 NOTE — Assessment & Plan Note (Signed)
Last LDL at goal of less than 70 given her diabetes Continue pravastatin at current dose

## 2017-12-09 NOTE — Patient Instructions (Signed)

## 2017-12-09 NOTE — Progress Notes (Signed)
Patient: Rebecca Patterson Female    DOB: [birth year]   83 y.o.   MRN: [id number] Visit Date: 12/09/2017  Today's Provider: Lavon Paganini, MD   Chief Complaint  Patient presents with  . Hyperlipidemia  . Pre-Diabete   Subjective:    I, Tiburcio Pea, CMA, am acting as a Education administrator for Lavon Paganini, MD.   HPI   Lipid/Cholesterol, Follow-up:   Last seen for this 3 months ago.  Management changes since that visit include patient advised to exercise and eat a low carb diet. . Last Lipid Panel:    Component Value Date/Time   CHOL 162 09/05/2017 1121   TRIG 129 09/05/2017 1121   HDL 75 09/05/2017 1121   CHOLHDL 2.2 09/05/2017 1121   LDLCALC 61 09/05/2017 1121    She reports good compliance with treatment. She is not having side effects.  Current symptoms include none  Weight trend: stable Prior visit with dietician: no Current diet: in general, a "healthy" diet   Current exercise: none  Wt Readings from Last 3 Encounters:  12/09/17 144 lb 3.2 oz (65.4 kg)  09/05/17 140 lb (63.5 kg)  09/05/17 140 lb 6.4 oz (63.7 kg)    -------------------------------------------------------------------  Diabetes, Follow-up:   Lab Results  Component Value Date   HGBA1C 6.5 (H) 09/05/2017   HGBA1C 6.2 (H) 08/29/2016   HGBA1C 6.1 (H) 08/18/2015   GLUCOSE 119 (H) 09/05/2017   GLUCOSE 127 (H) 08/29/2016   GLUCOSE 108 (H) 06/15/2016    Last seen for for this3 months ago.  Management since that visit includes advised patient to exercise and follow a low carb diet. A1c had been in prediabetes range for years and cross over to 6.5 at last visit. Current symptoms include none   Weight trend: stable Prior visit with dietician: no Current diet: in general, a "healthy" diet   Current exercise: none  Pertinent Labs:    Component Value Date/Time   CHOL 162 09/05/2017 1121   TRIG 129 09/05/2017 1121   CHOLHDL 2.2 09/05/2017 1121   CREATININE 0.81 09/05/2017 1121   CREATININE 0.85 11/04/2013 1029    Wt Readings from Last 3 Encounters:  12/09/17 144 lb 3.2 oz (65.4 kg)  09/05/17 140 lb (63.5 kg)  09/05/17 140 lb 6.4 oz (63.7 kg)   Patient reports fall 3 weeks ago.  She tripped over something on the floor at grocery store.  Landed on knees and R elbow.  No LOC, head injury, knee pain.  Did have bruising, swelling, and pain of R elbow at time of injury.  She wants to make sure it is ok as there is still a lump.  She also reports a feeling of discomfort with swallowing pills.  She has occasional reflux symptoms and takes pepcid prn.  No difficulty swallowing or feeling as though things are getting stuck.    Allergies  Allergen Reactions  . Actonel [Risedronate]   . Boniva [Ibandronic Acid]     shakes  . Fosamax [Alendronate]     Patient reports medication caused her to severe mood changes.     Current Outpatient Medications:  .  aspirin EC 81 MG tablet, Take 81 mg by mouth daily., Disp: , Rfl:  .  calcium carbonate (OS-CAL) 600 MG TABS tablet, Take 600 mg by mouth daily with breakfast. , Disp: , Rfl:  .  carbidopa-levodopa (SINEMET IR) 25-100 MG tablet, TAKE 1 TABLET BY MOUTH THREE TIMES A DAY, Disp: , Rfl:  .  docusate sodium (COLACE) 100 MG capsule, Take by mouth daily., Disp: , Rfl:  .  famotidine (PEPCID) 20 MG tablet, Take 20 mg by mouth as needed. , Disp: , Rfl:  .  ketoconazole (NIZORAL) 2 % cream, Apply 1 application topically daily as needed. , Disp: , Rfl:  .  levothyroxine (SYNTHROID, LEVOTHROID) 50 MCG tablet, TAKE 1 TABLET BY MOUTH DAILY, Disp: 90 tablet, Rfl: 3 .  Multiple Vitamins-Minerals (PRESERVISION AREDS 2 PO), Take by mouth., Disp: , Rfl:  .  nystatin cream (MYCOSTATIN), APPLY TO AFFECTED AREA TWICE A DAY AS NEEDED, Disp: 60 g, Rfl: 2 .  Omega-3 Fatty Acids (FISH OIL DOUBLE STRENGTH) 1200 MG CAPS, Take 1 capsule by mouth daily. , Disp: , Rfl:  .  polyethylene glycol (MIRALAX / GLYCOLAX) packet, Take 17 g by mouth daily.,  Disp: , Rfl:  .  pravastatin (PRAVACHOL) 40 MG tablet, TAKE 1 TABLET (40 MG TOTAL) BY MOUTH DAILY., Disp: 90 tablet, Rfl: 4 .  triamcinolone cream (KENALOG) 0.1 %, APPLY TO AFFECTED AREA 3 TIMES A DAY FOR INSECT BITES (Patient taking differently: APPLY TO AFFECTED AREA 3 TIMES A DAY FOR INSECT BITES as needed), Disp: 60 g, Rfl: 5  Review of Systems  Constitutional: Negative.   Respiratory: Negative.   Cardiovascular: Negative.   Gastrointestinal: Negative.   Endocrine: Negative.   Genitourinary: Negative.   Musculoskeletal: Negative.     Social History   Tobacco Use  . Smoking status: Never Smoker  . Smokeless tobacco: Never Used  Substance Use Topics  . Alcohol use: No   Objective:   BP (!) 163/92 (BP Location: Left Arm, Patient Position: Sitting, Cuff Size: Normal)   Pulse 61   Temp 97.6 F (36.4 C) (Oral)   Wt 144 lb 3.2 oz (65.4 kg)   SpO2 96%   BMI 28.16 kg/m  Vitals:   12/09/17 1118  BP: (!) 163/92  Pulse: 61  Temp: 97.6 F (36.4 C)  TempSrc: Oral  SpO2: 96%  Weight: 144 lb 3.2 oz (65.4 kg)     Physical Exam  Constitutional: She is oriented to person, place, and time. She appears well-developed and well-nourished. No distress.  HENT:  Head: Normocephalic and atraumatic.  Right Ear: External ear normal.  Left Ear: External ear normal.  Mouth/Throat: Oropharynx is clear and moist. No oropharyngeal exudate.  Eyes: Pupils are equal, round, and reactive to light. Conjunctivae are normal. Right eye exhibits no discharge. Left eye exhibits no discharge. No scleral icterus.  Neck: Neck supple. No thyromegaly present.  Cardiovascular: Normal rate, regular rhythm, normal heart sounds and intact distal pulses.  No murmur heard. Pulmonary/Chest: Effort normal and breath sounds normal. No respiratory distress. She has no wheezes. She has no rales.  Abdominal: Soft. She exhibits no distension. There is no tenderness.  Musculoskeletal: She exhibits no edema.    Lymphadenopathy:    She has no cervical adenopathy.  Neurological: She is alert and oriented to person, place, and time.  Skin: Skin is warm and dry. Capillary refill takes less than 2 seconds. No rash noted.  Psychiatric: She has a normal mood and affect. Her behavior is normal.  Vitals reviewed.    Diabetic Foot Exam - Simple   Simple Foot Form Diabetic Foot exam was performed with the following findings:  Yes 12/09/2017 11:50 AM  Visual Inspection No deformities, no ulcerations, no other skin breakdown bilaterally:  Yes Sensation Testing Intact to touch and monofilament testing bilaterally:  Yes Pulse Check Posterior Tibialis  and Dorsalis pulse intact bilaterally:  Yes Comments Calluses on b/l 2nd toes and 1st MTP joints medially        Assessment & Plan:   Problem List Items Addressed This Visit      Endocrine   T2DM (type 2 diabetes mellitus) ([place]) - Primary    New diagnosis A1c 6.5 for the last 2 checks Discussed importance of low-carb diet and exercise Long discussion regarding what a low-carb diet is We will start metformin 500 mg twice daily Discussed possible side effects of GI upset Will obtain last eye exam from [place] [facility] Foot exam completed today Microalbumin collected today and slightly elevated at 50.  We will recheck at next visit to see if this remains elevated       Relevant Medications   metFORMIN (GLUCOPHAGE) 500 MG tablet   Other Relevant Orders   POCT HgB A1C (Completed)   POCT UA - Microalbumin (Completed)    Other Visit Diagnoses    Fall, initial encounter       Relevant Orders   DG Elbow Complete Right (Completed) - new problem - no LOC, mechanical - discussed fall preventions - will obtain XRay given that she is still having some discomfort   Right elbow pain       Relevant Orders   DG Elbow Complete Right (Completed)   Globus sensation    -New problem -Likely related to reflux -Try taking Pepcid regularly 1-2 times daily and  see if this improves it -If it does not, may need to consider GI referral or PPI therapy       Return in about 3 months (around 03/11/2018) for diabetes f/u.   The entirety of the information documented in the History of Present Illness, Review of Systems and Physical Exam were personally obtained by me. Portions of this information were initially documented by Tiburcio Pea, CMA and reviewed by me for thoroughness and accuracy.    Virginia Crews, MD, MPH [facility] 12/09/2017 4:51 PM

## 2017-12-09 NOTE — Assessment & Plan Note (Signed)
New diagnosis A1c 6.5 for the last 2 checks Discussed importance of low-carb diet and exercise Long discussion regarding what a low-carb diet is We will start metformin 500 mg twice daily Discussed possible side effects of GI upset Will obtain last eye exam from [place] [facility] Foot exam completed today Microalbumin collected today and slightly elevated at 50.  We will recheck at next visit to see if this remains elevated

## 2017-12-10 ENCOUNTER — Telehealth: Payer: Self-pay

## 2017-12-10 NOTE — Telephone Encounter (Signed)
[place]

## 2017-12-10 NOTE — Telephone Encounter (Signed)
Patient was advise. 

## 2017-12-10 NOTE — Telephone Encounter (Signed)
-----   Message from Virginia Crews, MD sent at 12/09/2017  4:34 PM EST ----- Normal XRay of elbow  Brita Romp Dionne Bucy, MD, MPH [facility] 12/09/2017 4:34 PM

## 2017-12-10 NOTE — Telephone Encounter (Signed)
Pt returned missed call.  Please call pt back to disclose results. ° °Thanks, °[facility] °

## 2018-01-09 DIAGNOSIS — G20 Parkinson's disease: Secondary | ICD-10-CM | POA: Diagnosis not present

## 2018-03-02 ENCOUNTER — Other Ambulatory Visit: Payer: Self-pay | Admitting: Family Medicine

## 2018-03-02 DIAGNOSIS — E039 Hypothyroidism, unspecified: Secondary | ICD-10-CM

## 2018-03-03 ENCOUNTER — Other Ambulatory Visit: Payer: Self-pay | Admitting: Family Medicine

## 2018-03-03 DIAGNOSIS — L578 Other skin changes due to chronic exposure to nonionizing radiation: Secondary | ICD-10-CM | POA: Diagnosis not present

## 2018-03-03 DIAGNOSIS — L570 Actinic keratosis: Secondary | ICD-10-CM | POA: Diagnosis not present

## 2018-03-03 DIAGNOSIS — Z859 Personal history of malignant neoplasm, unspecified: Secondary | ICD-10-CM | POA: Diagnosis not present

## 2018-03-03 DIAGNOSIS — Z872 Personal history of diseases of the skin and subcutaneous tissue: Secondary | ICD-10-CM | POA: Diagnosis not present

## 2018-03-03 DIAGNOSIS — Z85828 Personal history of other malignant neoplasm of skin: Secondary | ICD-10-CM | POA: Diagnosis not present

## 2018-03-03 DIAGNOSIS — L821 Other seborrheic keratosis: Secondary | ICD-10-CM | POA: Diagnosis not present

## 2018-03-10 ENCOUNTER — Ambulatory Visit: Payer: Self-pay | Admitting: Family Medicine

## 2018-03-12 ENCOUNTER — Ambulatory Visit: Payer: Medicare Other | Admitting: Family Medicine

## 2018-03-17 ENCOUNTER — Ambulatory Visit: Payer: Self-pay | Admitting: Family Medicine

## 2018-03-18 NOTE — Progress Notes (Signed)
Patient: Rebecca Patterson Female    DOB: 1934/06/19   84 y.o.   MRN: [id number] Visit Date: 03/19/2018  Today's Provider: Lavon Paganini, MD   Chief Complaint  Patient presents with  . Diabetes   Subjective:    I, Tiburcio Pea, CMA, am acting as a scribe for Lavon Paganini, MD.   HPI  Diabetes, Follow-up:   RecentLabs       Lab Results  Component Value Date   HGBA1C 6.5 (H) 09/05/2017   HGBA1C 6.2 (H) 08/29/2016   HGBA1C 6.1 (H) 08/18/2015   GLUCOSE 119 (H) 09/05/2017   GLUCOSE 127 (H) 08/29/2016   GLUCOSE 108 (H) 06/15/2016      Last seen for for this 3 months ago.  Management since that visit includes advised to start Metformin 500 mg BID. She reports good compliance with treatment plan. She denies any side effects. Current symptoms include none  FBS are not being checked at home.   Weight trend: decreasing  Prior visit with dietician: no Current diet: in general, a "healthy" diet   Current exercise: walking when weather allows  Pertinent Labs: Lab Results  Component Value Date   CHOL 162 09/05/2017   HDL 75 09/05/2017   LDLCALC 61 09/05/2017   TRIG 129 09/05/2017   CHOLHDL 2.2 09/05/2017       Wt Readings from Last 3 Encounters:  12/09/17 144 lb 3.2 oz (65.4 kg)  09/05/17 140 lb (63.5 kg)  09/05/17 140 lb 6.4 oz (63.7 kg)    Reports new onset dysphagia for a few months.  Mostly with solid foods.  Still able to eat as needed.    Allergies  Allergen Reactions  . Actonel [Risedronate]   . Boniva [Ibandronic Acid]     shakes  . Fosamax [Alendronate]     Patient reports medication caused her to severe mood changes.     Current Outpatient Medications:  .  aspirin EC 81 MG tablet, Take 81 mg by mouth daily., Disp: , Rfl:  .  calcium carbonate (OS-CAL) 600 MG TABS tablet, Take 600 mg by mouth daily with breakfast. , Disp: , Rfl:  .  carbidopa-levodopa (SINEMET IR) 25-100 MG tablet, TAKE 1 TABLET BY MOUTH THREE TIMES A DAY,  Disp: , Rfl:  .  docusate sodium (COLACE) 100 MG capsule, Take by mouth daily., Disp: , Rfl:  .  famotidine (PEPCID) 20 MG tablet, Take 20 mg by mouth as needed. , Disp: , Rfl:  .  ketoconazole (NIZORAL) 2 % cream, Apply 1 application topically daily as needed. , Disp: , Rfl:  .  levothyroxine (SYNTHROID, LEVOTHROID) 50 MCG tablet, TAKE 1 TABLET BY MOUTH EVERY DAY, Disp: 90 tablet, Rfl: 3 .  metFORMIN (GLUCOPHAGE) 500 MG tablet, TAKE 1 TABLET (500 MG TOTAL) BY MOUTH 2 (TWO) TIMES DAILY WITH A MEAL., Disp: 180 tablet, Rfl: 1 .  Multiple Vitamins-Minerals (PRESERVISION AREDS 2 PO), Take by mouth., Disp: , Rfl:  .  nystatin cream (MYCOSTATIN), APPLY TO AFFECTED AREA TWICE A DAY AS NEEDED, Disp: 60 g, Rfl: 2 .  Omega-3 Fatty Acids (FISH OIL DOUBLE STRENGTH) 1200 MG CAPS, Take 1 capsule by mouth daily. , Disp: , Rfl:  .  polyethylene glycol (MIRALAX / GLYCOLAX) packet, Take 17 g by mouth daily., Disp: , Rfl:  .  pravastatin (PRAVACHOL) 40 MG tablet, TAKE 1 TABLET (40 MG TOTAL) BY MOUTH DAILY., Disp: 90 tablet, Rfl: 4 .  triamcinolone cream (KENALOG) 0.1 %, APPLY TO  AFFECTED AREA 3 TIMES A DAY FOR INSECT BITES (Patient taking differently: APPLY TO AFFECTED AREA 3 TIMES A DAY FOR INSECT BITES as needed), Disp: 60 g, Rfl: 5   Review of Systems  Constitutional: Negative.   Respiratory: Negative.   Cardiovascular: Negative.   Endocrine: Negative.   Musculoskeletal: Negative.     Social History   Tobacco Use  . Smoking status: Never Smoker  . Smokeless tobacco: Never Used  Substance Use Topics  . Alcohol use: No      Objective:   BP 126/78 (BP Location: Right Arm, Patient Position: Sitting, Cuff Size: Normal)   Pulse 86   Temp 98.2 F (36.8 C) (Oral)   Wt 136 lb 3.2 oz (61.8 kg)   SpO2 98%   BMI 26.60 kg/m  Vitals:   03/19/18 1431  BP: 126/78  Pulse: 86  Temp: 98.2 F (36.8 C)  TempSrc: Oral  SpO2: 98%  Weight: 136 lb 3.2 oz (61.8 kg)     Physical Exam Vitals signs  reviewed.  Constitutional:      General: She is not in acute distress.    Appearance: Normal appearance. She is not diaphoretic.  HENT:     Head: Normocephalic and atraumatic.     Right Ear: Tympanic membrane, ear canal and external ear normal.     Left Ear: Tympanic membrane, ear canal and external ear normal.     Nose: Nose normal. No congestion.     Mouth/Throat:     Mouth: Mucous membranes are moist.     Pharynx: Oropharynx is clear. No oropharyngeal exudate or posterior oropharyngeal erythema.  Eyes:     General: No scleral icterus.    Conjunctiva/sclera: Conjunctivae normal.     Pupils: Pupils are equal, round, and reactive to light.  Neck:     Musculoskeletal: Neck supple.     Thyroid: No thyromegaly.     Trachea: Trachea normal.  Cardiovascular:     Rate and Rhythm: Normal rate and regular rhythm.     Pulses: Normal pulses.     Heart sounds: Normal heart sounds. No murmur.  Pulmonary:     Effort: Pulmonary effort is normal. No respiratory distress.     Breath sounds: Normal breath sounds. No wheezing or rhonchi.  Abdominal:     General: There is no distension.     Palpations: Abdomen is soft.     Tenderness: There is no abdominal tenderness.  Musculoskeletal:     Right lower leg: No edema.     Left lower leg: No edema.  Lymphadenopathy:     Cervical: No cervical adenopathy.  Skin:    General: Skin is warm and dry.     Capillary Refill: Capillary refill takes less than 2 seconds.     Findings: No rash.  Neurological:     Mental Status: She is alert and oriented to person, place, and time. Mental status is at baseline.  Psychiatric:        Mood and Affect: Mood normal.        Behavior: Behavior normal.      Results for orders placed or performed in visit on 03/19/18  POCT HgB A1C  Result Value Ref Range   Hemoglobin A1C 6.1 (A) 4.0 - 5.6 %   HbA1c POC (<> result, manual entry)     HbA1c, POC (prediabetic range)     HbA1c, POC (controlled diabetic range)            Assessment & Plan  Problem List Items Addressed This Visit      Digestive   Dysphagia    New problem Normal exam Referral to GI for further evaluation      Relevant Orders   Ambulatory referral to Gastroenterology     Endocrine   T2DM (type 2 diabetes mellitus) ([place]) - Primary    Well controlled with A1c 6.1 Continue current medications UTD on vaccines, eye exam, foot exam On Statin Discussed diet and exercise F/u in 6 months       Relevant Orders   POCT HgB A1C (Completed)   Basic Metabolic Panel (BMET)   Hyperlipidemia associated with type 2 diabetes mellitus ([place])    Last LDL at goal of <70 given DM Continue pravastatin at current dose      Relevant Orders   Basic Metabolic Panel (BMET)   Adult hypothyroidism    Well controlled and asymptomatic Continue synthroid at current dose Recheck TSH      Relevant Orders   TSH       Return in about 6 months (around 09/17/2018) for CPE/AWV.   The entirety of the information documented in the History of Present Illness, Review of Systems and Physical Exam were personally obtained by me. Portions of this information were initially documented by Tiburcio Pea, CMA and reviewed by me for thoroughness and accuracy.    Virginia Crews, MD, MPH [facility] 03/19/2018 3:07 PM

## 2018-03-19 ENCOUNTER — Encounter: Payer: Self-pay | Admitting: Family Medicine

## 2018-03-19 ENCOUNTER — Ambulatory Visit (INDEPENDENT_AMBULATORY_CARE_PROVIDER_SITE_OTHER): Payer: Medicare Other | Admitting: Family Medicine

## 2018-03-19 VITALS — BP 126/78 | HR 86 | Temp 98.2°F | Wt 136.2 lb

## 2018-03-19 DIAGNOSIS — E039 Hypothyroidism, unspecified: Secondary | ICD-10-CM

## 2018-03-19 DIAGNOSIS — E1169 Type 2 diabetes mellitus with other specified complication: Secondary | ICD-10-CM | POA: Diagnosis not present

## 2018-03-19 DIAGNOSIS — E119 Type 2 diabetes mellitus without complications: Secondary | ICD-10-CM

## 2018-03-19 DIAGNOSIS — R1310 Dysphagia, unspecified: Secondary | ICD-10-CM | POA: Insufficient documentation

## 2018-03-19 DIAGNOSIS — E785 Hyperlipidemia, unspecified: Secondary | ICD-10-CM | POA: Diagnosis not present

## 2018-03-19 LAB — POCT GLYCOSYLATED HEMOGLOBIN (HGB A1C): Hemoglobin A1C: 6.1 % — AB (ref 4.0–5.6)

## 2018-03-19 NOTE — Assessment & Plan Note (Signed)
Well controlled and asymptomatic Continue synthroid at current dose Recheck TSH

## 2018-03-19 NOTE — Assessment & Plan Note (Signed)
Last LDL at goal of <70 given DM Continue pravastatin at current dose

## 2018-03-19 NOTE — Assessment & Plan Note (Signed)
Well controlled with A1c 6.1 Continue current medications UTD on vaccines, eye exam, foot exam On Statin Discussed diet and exercise F/u in 6 months

## 2018-03-19 NOTE — Patient Instructions (Signed)

## 2018-03-19 NOTE — Assessment & Plan Note (Signed)
New problem Normal exam Referral to GI for further evaluation

## 2018-03-20 LAB — TSH: TSH: 0.518 u[IU]/mL (ref 0.450–4.500)

## 2018-03-20 LAB — BASIC METABOLIC PANEL
BUN / CREAT RATIO: 24 (ref 12–28)
BUN: 16 mg/dL (ref 8–27)
CALCIUM: 9.6 mg/dL (ref 8.7–10.3)
CHLORIDE: 98 mmol/L (ref 96–106)
CO2: 28 mmol/L (ref 20–29)
Creatinine, Ser: 0.66 mg/dL (ref 0.57–1.00)
GFR calc Af Amer: 96 mL/min/{1.73_m2} (ref >59)
GFR, EST NON AFRICAN AMERICAN: 83 mL/min/{1.73_m2} (ref >59)
Glucose: 89 mg/dL (ref 65–99)
POTASSIUM: 4 mmol/L (ref 3.5–5.2)
SODIUM: 141 mmol/L (ref 134–144)

## 2018-04-14 ENCOUNTER — Ambulatory Visit: Payer: Medicare Other | Admitting: Gastroenterology

## 2018-04-14 ENCOUNTER — Other Ambulatory Visit: Payer: Self-pay

## 2018-04-14 ENCOUNTER — Encounter: Payer: Self-pay | Admitting: Gastroenterology

## 2018-04-14 VITALS — BP 112/72 | HR 89 | Ht 60.0 in | Wt 137.2 lb

## 2018-04-14 DIAGNOSIS — R1319 Other dysphagia: Secondary | ICD-10-CM

## 2018-04-14 DIAGNOSIS — R1310 Dysphagia, unspecified: Secondary | ICD-10-CM | POA: Diagnosis not present

## 2018-04-14 NOTE — Progress Notes (Signed)
Rebecca Patterson [address]  [place]  [place], [place] [address]  Main: [phone]  Fax: [phone]   Gastroenterology Consultation  Referring Provider:     Virginia Crews, MD Primary Care Physician:  Virginia Crews, MD Reason for Consultation:     Dysphagia        HPI:    Chief Complaint  Patient presents with  . New Patient (Initial Visit)    dysphagia    Rebecca Patterson is a 84 y.o. y/o female referred for consultation & management  by Dr. Brita Romp, Dionne Bucy, MD.  Patient reports 1 year history of dysphagia to solid foods only.  Reports dysphagia with pills and meats.  No dysphagia with liquids.  Symptoms occur about twice a week.  No episodes of food impaction.  Does not have to bring pieces of food back up.  Tries to chew well.  No altered bowel habits, or blood in stool.  No prior EGD or colonoscopy.  No family history of colon cancer.    Past Medical History:  Diagnosis Date  . Allergy   . Breast cancer ([place]) 2013   Rt  . Cancer [facility]) 2013   Right Breast- radiation and Tamoxifen  . Complication of anesthesia   . Corn   . Family history of adverse reaction to anesthesia    sister - slow to wake  . Fibrocystic breast   . GERD (gastroesophageal reflux disease)   . Hyperlipidemia   . Osteoporosis   . Osteoporosis   . PONV (postoperative nausea and vomiting)   . Thyroid disease   . Vertigo    long ago  . Wears dentures    full upper and lower  . Wears hearing aid    bilateral    Past Surgical History:  Procedure Laterality Date  . ABDOMINAL HYSTERECTOMY    . APPENDECTOMY    . BREAST BIOPSY Right 2013   Stereo- Positive  . BREAST EXCISIONAL BIOPSY Right 2013  . BREAST LUMPECTOMY Right    2013  . BUNIONECTOMY Bilateral 2004  . 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 CATARACT EXTRACTION W/PHACO Right 11/10/2014   Procedure: CATARACT EXTRACTION PHACO AND INTRAOCULAR LENS PLACEMENT (IOC);  Surgeon: Leandrew Koyanagi,  MD;  Location: [facility];  Service: Ophthalmology;  Laterality: Right;  . CHOLECYSTECTOMY    . KNEE SURGERY Right 2005  . Mastoid surgery Right   . SKIN CANCER EXCISION  03/17/2014   right side of face Dr. Aubery Lapping  . THYROID SURGERY N/A 1980   Goiter    Prior to Admission medications   Medication Sig Start Date End Date Taking? Authorizing Provider  aspirin EC 81 MG tablet Take 81 mg by mouth daily.   Yes [provider]  calcium carbonate (OS-CAL) 600 MG TABS tablet Take 600 mg by mouth daily with breakfast.  01/20/10  Yes [provider]  carbidopa-levodopa (SINEMET IR) 25-100 MG tablet TAKE 1 TABLET BY MOUTH THREE TIMES A DAY 12/02/17  Yes [provider]  docusate sodium (COLACE) 100 MG capsule Take by mouth daily.   Yes [provider]  famotidine (PEPCID) 20 MG tablet Take 20 mg by mouth as needed.    Yes [provider]  ketoconazole (NIZORAL) 2 % cream Apply 1 application topically daily as needed.  08/07/15  Yes [provider]  levothyroxine (SYNTHROID, LEVOTHROID) 50 MCG tablet TAKE 1 TABLET BY MOUTH EVERY DAY 03/03/18  Yes Bacigalupo, Dionne Bucy, MD  metFORMIN (GLUCOPHAGE) 500 MG tablet TAKE 1 TABLET (500 MG TOTAL) BY MOUTH 2 (TWO) TIMES DAILY WITH A MEAL. 03/03/18  Yes Bacigalupo, Dionne Bucy, MD  Multiple Vitamins-Minerals (PRESERVISION AREDS 2 PO) Take by mouth.   Yes [provider]  nystatin cream (MYCOSTATIN) APPLY TO AFFECTED AREA TWICE A DAY AS NEEDED 08/04/15  Yes Margarita Rana, MD  Omega-3 Fatty Acids (FISH OIL DOUBLE STRENGTH) 1200 MG CAPS Take 1 capsule by mouth daily.  01/20/10  Yes [provider]  polyethylene glycol (MIRALAX / GLYCOLAX) packet Take 17 g by mouth daily.   Yes [provider]  pravastatin (PRAVACHOL) 40 MG tablet TAKE 1 TABLET (40 MG TOTAL) BY MOUTH DAILY. 03/03/17  Yes Birdie Sons, MD  triamcinolone cream (KENALOG) 0.1 % APPLY TO AFFECTED AREA 3 TIMES A DAY  FOR INSECT BITES Patient taking differently: APPLY TO AFFECTED AREA 3 TIMES A DAY FOR INSECT BITES as needed 12/06/16  Yes Birdie Sons, MD    Family History  Problem Relation Age of Onset  . Stroke Sister   . Breast cancer Sister 9  . Colon cancer Neg Hx      Social History   Tobacco Use  . Smoking status: Never Smoker  . Smokeless tobacco: Never Used  Substance Use Topics  . Alcohol use: No  . Drug use: No    Allergies as of 04/14/2018 - Review Complete 04/14/2018  Allergen Reaction Noted  . Actonel [risedronate]  05/20/2017  . Boniva [ibandronic acid]  05/20/2017  . Fosamax [alendronate]  02/18/2015    Review of Systems:    All systems reviewed and negative except where noted in HPI.   Physical Exam:  BP 112/72   Pulse 89   Ht 5' (1.524 m)   Wt 137 lb 3.2 oz (62.2 kg)   BMI 26.80 kg/m  No LMP recorded. Patient has had a hysterectomy. Psych:  Alert and cooperative. Normal mood and affect. General:   Alert,  Well-developed, well-nourished, pleasant and cooperative in NAD Head:  Normocephalic and atraumatic. Eyes:  Sclera clear, no icterus.   Conjunctiva pink. Ears:  Normal auditory acuity. Nose:  No deformity, discharge, or lesions. Mouth:  No deformity or lesions,oropharynx pink & moist. Neck:  Supple; no masses or thyromegaly. Abdomen:  Normal bowel sounds.  No bruits.  Soft, non-tender and non-distended without masses, hepatosplenomegaly or hernias noted.  No guarding or rebound tenderness.    Msk:  Symmetrical without gross deformities. Good, equal movement & strength bilaterally. Pulses:  Normal pulses noted. Extremities:  No clubbing or edema.  No cyanosis. Neurologic:  Alert and oriented x3;  grossly normal neurologically. Skin:  Intact without significant lesions or rashes. No jaundice. Lymph Nodes:  No significant cervical adenopathy. Psych:  Alert and cooperative. Normal mood and affect.   Labs: CBC    Component Value Date/Time   WBC 6.4  06/14/2017 1345   RBC 4.12 06/14/2017 1345   HGB 13.0 06/14/2017 1345   HGB 13.0 11/04/2013 1029   HCT 37.8 06/14/2017 1345   HCT 40.1 11/04/2013 1029   PLT 212 06/14/2017 1345   PLT 230 11/04/2013 1029   MCV 91.8 06/14/2017 1345   MCV 93 11/04/2013 1029   MCH 31.6 06/14/2017 1345   MCHC 34.4 06/14/2017 1345   RDW 13.8 06/14/2017 1345   RDW 13.4 11/04/2013 1029   LYMPHSABS 1.7 06/14/2017 1345   LYMPHSABS 2.1 11/04/2013 1029   MONOABS 0.5 06/14/2017 1345   MONOABS 0.4 11/04/2013 1029  EOSABS 0.1 06/14/2017 1345   EOSABS 0.1 11/04/2013 1029   BASOSABS 0.0 06/14/2017 1345   BASOSABS 0.0 11/04/2013 1029   CMP     Component Value Date/Time   NA 141 03/19/2018 1502   K 4.0 03/19/2018 1502   CL 98 03/19/2018 1502   CO2 28 03/19/2018 1502   GLUCOSE 89 03/19/2018 1502   GLUCOSE 108 (H) 06/15/2016 1105   BUN 16 03/19/2018 1502   CREATININE 0.66 03/19/2018 1502   CREATININE 0.85 11/04/2013 1029   CALCIUM 9.6 03/19/2018 1502   PROT 6.8 09/05/2017 1121   PROT 6.9 11/04/2013 1029   ALBUMIN 4.7 09/05/2017 1121   ALBUMIN 3.9 11/04/2013 1029   AST 15 09/05/2017 1121   AST 9 (L) 11/04/2013 1029   ALT 14 09/05/2017 1121   ALT 29 11/04/2013 1029   ALKPHOS 49 09/05/2017 1121   ALKPHOS 77 11/04/2013 1029   BILITOT 0.7 09/05/2017 1121   BILITOT 0.3 11/04/2013 1029   GFRNONAA 83 03/19/2018 1502   GFRNONAA >60 11/04/2013 1029   GFRNONAA >60 11/03/2012 1106   GFRAA 96 03/19/2018 1502   GFRAA >60 11/04/2013 1029   GFRAA >60 11/03/2012 1106    Imaging Studies: No results found.  Assessment and Plan:   GLENDER AUGUSTA is a 84 y.o. y/o female has been referred for 1 year history of dysphagia  We discussed further evaluation with upper endoscopy to rule out any underlying strictures or rings or mass Alternatives of conservative management and undergoing radiological study with upper GI study also discussed Risks and benefits of both options discussed in detail and patient and family  would like to proceed with upper endoscopy at this time.  Upper endoscopy would also allow for treatment of any underlying lesions and biopsies for EOE as well  I have discussed alternative options, risks & benefits,  which include, but are not limited to, bleeding, infection, perforation,respiratory complication & drug reaction.  The patient agrees with this plan & written consent will be obtained.    Patient takes Pepcid as needed for reflux, and symptoms of reflux occurring about twice a week Patient educated extensively on acid reflux lifestyle modification, including buying a bed wedge, not eating 3 hrs before bedtime, diet modifications, and handout given for the same.   Given symptoms are only with solids, and not all solids, and none with liquids, oropharyngeal dysphagia is less likely, and this is most likely esophageal dysphagia.  However, if EGD is negative, may consider motility study or modified barium swallow, or treatment with PPI to evaluate if symptoms resolved.   Dr Rebecca Patterson  Speech recognition software was used to dictate the above note.

## 2018-04-23 ENCOUNTER — Encounter: Payer: Self-pay | Admitting: Anesthesiology

## 2018-04-23 ENCOUNTER — Other Ambulatory Visit: Payer: Self-pay

## 2018-04-23 ENCOUNTER — Encounter
Admission: RE | Disposition: A | Discharge status: Home / Self Care | Payer: Self-pay | Source: Home / Self Care | Attending: Gastroenterology

## 2018-04-23 ENCOUNTER — Ambulatory Visit: Payer: Medicare Other | Admitting: Anesthesiology

## 2018-04-23 ENCOUNTER — Ambulatory Visit
Admission: RE | Admit: 2018-04-23 | Discharge: 2018-04-23 | Disposition: A | Discharge status: Home / Self Care | Payer: Medicare Other | Attending: Gastroenterology | Admitting: Gastroenterology

## 2018-04-23 DIAGNOSIS — K219 Gastro-esophageal reflux disease without esophagitis: Secondary | ICD-10-CM | POA: Diagnosis not present

## 2018-04-23 DIAGNOSIS — K449 Diaphragmatic hernia without obstruction or gangrene: Secondary | ICD-10-CM | POA: Diagnosis not present

## 2018-04-23 DIAGNOSIS — K228 Other specified diseases of esophagus: Secondary | ICD-10-CM | POA: Insufficient documentation

## 2018-04-23 DIAGNOSIS — Z888 Allergy status to other drugs, medicaments and biological substances status: Secondary | ICD-10-CM | POA: Diagnosis not present

## 2018-04-23 DIAGNOSIS — R1310 Dysphagia, unspecified: Secondary | ICD-10-CM | POA: Diagnosis not present

## 2018-04-23 DIAGNOSIS — Z853 Personal history of malignant neoplasm of breast: Secondary | ICD-10-CM | POA: Diagnosis not present

## 2018-04-23 DIAGNOSIS — Q399 Congenital malformation of esophagus, unspecified: Secondary | ICD-10-CM | POA: Diagnosis not present

## 2018-04-23 DIAGNOSIS — E119 Type 2 diabetes mellitus without complications: Secondary | ICD-10-CM | POA: Diagnosis not present

## 2018-04-23 DIAGNOSIS — R1319 Other dysphagia: Secondary | ICD-10-CM

## 2018-04-23 DIAGNOSIS — Z79899 Other long term (current) drug therapy: Secondary | ICD-10-CM | POA: Diagnosis not present

## 2018-04-23 DIAGNOSIS — Z7982 Long term (current) use of aspirin: Secondary | ICD-10-CM | POA: Insufficient documentation

## 2018-04-23 DIAGNOSIS — E785 Hyperlipidemia, unspecified: Secondary | ICD-10-CM | POA: Diagnosis not present

## 2018-04-23 DIAGNOSIS — E039 Hypothyroidism, unspecified: Secondary | ICD-10-CM | POA: Diagnosis not present

## 2018-04-23 DIAGNOSIS — Z85828 Personal history of other malignant neoplasm of skin: Secondary | ICD-10-CM | POA: Diagnosis not present

## 2018-04-23 DIAGNOSIS — Z9841 Cataract extraction status, right eye: Secondary | ICD-10-CM | POA: Diagnosis not present

## 2018-04-23 DIAGNOSIS — Z7984 Long term (current) use of oral hypoglycemic drugs: Secondary | ICD-10-CM | POA: Insufficient documentation

## 2018-04-23 DIAGNOSIS — Z961 Presence of intraocular lens: Secondary | ICD-10-CM | POA: Diagnosis not present

## 2018-04-23 HISTORY — PX: ESOPHAGOGASTRODUODENOSCOPY (EGD) WITH PROPOFOL: SHX5813

## 2018-04-23 LAB — KOH PREP: KOH Prep: NONE SEEN

## 2018-04-23 SURGERY — ESOPHAGOGASTRODUODENOSCOPY (EGD) WITH PROPOFOL
Anesthesia: General

## 2018-04-23 MED ORDER — PROPOFOL 10 MG/ML IV BOLUS
INTRAVENOUS | Status: AC
  Filled 2018-04-23: qty 20

## 2018-04-23 MED ORDER — PROPOFOL 10 MG/ML IV BOLUS
INTRAVENOUS | Status: DC | PRN
  Administered 2018-04-23: 70 mg via INTRAVENOUS
  Administered 2018-04-23 (×2): 30 mg via INTRAVENOUS

## 2018-04-23 MED ORDER — SODIUM CHLORIDE 0.9 % IV SOLN
INTRAVENOUS | Status: DC
  Administered 2018-04-23: 1000 mL via INTRAVENOUS

## 2018-04-23 NOTE — H&P (Signed)
[name], MD [address] [place], [place], [place], [address] [address], [place], [place], [place], [address] Phone: [phone]  Fax: [phone]  Primary Care Physician:  Virginia Crews, MD   Pre-Procedure History & Physical: HPI:  Rebecca Patterson is a 84 y.o. female is here for an EGD.   Past Medical History:  Diagnosis Date  . Allergy   . Breast cancer ([place]) 2013   Rt  . Cancer [facility]) 2013   Right Breast- radiation and Tamoxifen  . Complication of anesthesia   . Corn   . Family history of adverse reaction to anesthesia    sister - slow to wake  . Fibrocystic breast   . GERD (gastroesophageal reflux disease)   . Hyperlipidemia   . Osteoporosis   . Osteoporosis   . PONV (postoperative nausea and vomiting)   . Thyroid disease   . Vertigo    long ago  . Wears dentures    full upper and lower  . Wears hearing aid    bilateral    Past Surgical History:  Procedure Laterality Date  . ABDOMINAL HYSTERECTOMY    . APPENDECTOMY    . BREAST BIOPSY Right 2013   Stereo- Positive  . BREAST EXCISIONAL BIOPSY Right 2013  . BREAST LUMPECTOMY Right    2013  . BUNIONECTOMY Bilateral 2004  . 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 CATARACT EXTRACTION W/PHACO Right 11/10/2014   Procedure: CATARACT EXTRACTION PHACO AND INTRAOCULAR LENS PLACEMENT (IOC);  Surgeon: Leandrew Koyanagi, MD;  Location: [facility];  Service: Ophthalmology;  Laterality: Right;  . CHOLECYSTECTOMY    . KNEE SURGERY Right 2005  . Mastoid surgery Right   . SKIN CANCER EXCISION  03/17/2014   right side of face Dr. Aubery Lapping  . THYROID SURGERY N/A 1980   Goiter    Prior to Admission medications   Medication Sig Start Date End Date Taking? Authorizing Provider  aspirin EC 81 MG tablet Take 81 mg by mouth daily.   Yes [provider]  calcium carbonate (OS-CAL) 600 MG TABS tablet Take 600 mg by mouth daily with breakfast.  01/20/10  Yes [provider]  carbidopa-levodopa (SINEMET IR) 25-100 MG tablet TAKE 1 TABLET BY MOUTH THREE TIMES A DAY 12/02/17  Yes [provider]  docusate sodium (COLACE) 100 MG capsule Take by mouth daily.   Yes [provider]  famotidine (PEPCID) 20 MG tablet Take 20 mg by mouth as needed.    Yes [provider]  ketoconazole (NIZORAL) 2 % cream Apply 1 application topically daily as needed.  08/07/15  Yes [provider]  levothyroxine (SYNTHROID, LEVOTHROID) 50 MCG tablet TAKE 1 TABLET BY MOUTH EVERY DAY 03/03/18  Yes Bacigalupo, Dionne Bucy, MD  metFORMIN (GLUCOPHAGE) 500 MG tablet TAKE 1 TABLET (500 MG TOTAL) BY MOUTH 2 (TWO) TIMES DAILY WITH A MEAL. 03/03/18  Yes Bacigalupo, Dionne Bucy, MD  Multiple Vitamins-Minerals (PRESERVISION AREDS 2 PO) Take by mouth.   Yes [provider]  nystatin cream (MYCOSTATIN) APPLY TO AFFECTED AREA TWICE A DAY AS NEEDED 08/04/15  Yes Margarita Rana, MD  Omega-3 Fatty Acids (FISH OIL DOUBLE STRENGTH) 1200 MG CAPS Take 1 capsule by mouth daily.  01/20/10  Yes [provider]  polyethylene glycol (MIRALAX / GLYCOLAX) packet Take 17 g by mouth daily.   Yes [provider]  pravastatin (PRAVACHOL) 40 MG tablet TAKE 1 TABLET (40 MG TOTAL) BY MOUTH DAILY. 03/03/17  Yes Fisher,  Kirstie Peri, MD  triamcinolone cream (KENALOG) 0.1 % APPLY TO AFFECTED AREA 3 TIMES A DAY FOR INSECT BITES Patient not taking: Reported on 04/23/2018 12/06/16   Birdie Sons, MD    Allergies as of 04/16/2018 - Review Complete 04/14/2018  Allergen Reaction Noted  . Actonel [risedronate]  05/20/2017  . Boniva [ibandronic acid]  05/20/2017  . Fosamax [alendronate]  02/18/2015    Family History  Problem Relation Age of Onset  . Stroke Sister   . Breast cancer Sister 16  . Colon cancer Neg Hx     Social History   Socioeconomic History  . Marital status: Married    Spouse name: Juanda Crumble  . Number of children: 2  . Years of education: Not on  file  . Highest education level: 12th grade  Occupational History  . Occupation: retired  Scientific laboratory technician  . Financial resource strain: Not hard at all  . Food insecurity:    Worry: Never true    Inability: Never true  . Transportation needs:    Medical: No    Non-medical: No  Tobacco Use  . Smoking status: Never Smoker  . Smokeless tobacco: Never Used  Substance and Sexual Activity  . Alcohol use: No  . Drug use: No  . Sexual activity: Not on file  Lifestyle  . Physical activity:    Days per week: Not on file    Minutes per session: Not on file  . Stress: To some extent  Relationships  . Social connections:    Talks on phone: Not on file    Gets together: Not on file    Attends religious service: Not on file    Active member of club or organization: Not on file    Attends meetings of clubs or organizations: Not on file    Relationship status: Not on file  . Intimate partner violence:    Fear of current or ex partner: Not on file    Emotionally abused: Not on file    Physically abused: Not on file    Forced sexual activity: Not on file  Other Topics Concern  . Not on file  Social History Narrative  . Not on file    Review of Systems: See HPI, otherwise negative ROS  Physical Exam: BP 139/78   Pulse 65   Temp 97.7 F (36.5 C) (Oral)   Resp 17   Ht 5\' 2"  (1.575 m)   Wt 59 kg   SpO2 100%   BMI 23.78 kg/m  General:   Alert,  pleasant and cooperative in NAD Head:  Normocephalic and atraumatic. Neck:  Supple; no masses or thyromegaly. Lungs:  Clear throughout to auscultation, normal respiratory effort.    Heart:  +S1, +S2, Regular rate and rhythm, No edema. Abdomen:  Soft, nontender and nondistended. Normal bowel sounds, without guarding, and without rebound.   Neurologic:  Alert and  oriented x4;  grossly normal neurologically.  Impression/Plan: [name] is here for an EGD for dysphagia  Risks, benefits, limitations, and alternatives regarding the  procedure have been reviewed with the patient.  Questions have been answered.  All parties agreeable.   Virgel Manifold, MD  04/23/2018, 10:35 AM

## 2018-04-23 NOTE — Op Note (Signed)
[facility] Patient Name: Rebecca Patterson Procedure Date: 04/23/2018 10:40 AM MRN: [id number] Account #: [id number] Date of Birth: November 03, 1934 Admit Type: Outpatient Age: 84 Room: [facility] ENDO ROOM 4 Gender: Female Note Status: Finalized Procedure:            Upper GI endoscopy Indications:          Dysphagia Providers:            Colbie Danner B. Bonna Gains MD, MD Referring MD:         Dionne Bucy. Bacigalupo (Referring MD) Medicines:            Monitored Anesthesia Care Complications:        No immediate complications. Procedure:            Pre-Anesthesia Assessment:                       - Prior to the procedure, a History and Physical was                        performed, and patient medications, allergies and                        sensitivities were reviewed. The patient's tolerance of                        previous anesthesia was reviewed.                       - The risks and benefits of the procedure and the                        sedation options and risks were discussed with the                        patient. All questions were answered and informed                        consent was obtained.                       - Patient identification and proposed procedure were                        verified prior to the procedure by the physician, the                        nurse, the anesthesiologist, the anesthetist and the                        technician. The procedure was verified in the procedure                        room.                       - ASA Grade Assessment: III - A patient with severe                        systemic disease.                       After obtaining  informed consent, the endoscope was                        passed under direct vision. Throughout the procedure,                        the patient's blood pressure, pulse, and oxygen                        saturations were monitored continuously. The Endoscope       was introduced through the mouth, and advanced to the                        second part of duodenum. The upper GI endoscopy was                        accomplished with ease. The patient tolerated the                        procedure well. Findings:      White nummular lesions were noted in the mid esophagus. Brushings for       KOH prep were obtained.      The distal esophagus was mildly tortuous.      There is no endoscopic evidence of stenosis or stricture in the entire       esophagus. Biopsies were obtained from the proximal and distal esophagus       with cold forceps for histology of suspected eosinophilic esophagitis.      The entire examined stomach was normal.      A small hiatal hernia was present.      The duodenal bulb, second portion of the duodenum and examined duodenum       were normal. Impression:           - White nummular lesions in esophageal mucosa.                        Brushings performed.                       - Tortuous esophagus.                       - Normal stomach.                       - Small hiatal hernia.                       - Normal duodenal bulb, second portion of the duodenum                        and examined duodenum. Recommendation:       - Discharge patient to home (with escort).                       - Advance diet as tolerated.                       - Continue present medications.                       - Patient has a contact number available  for                        emergencies. The signs and symptoms of potential                        delayed complications were discussed with the patient.                        Return to normal activities tomorrow. Written discharge                        instructions were provided to the patient.                       - Discharge patient to home (with escort).                       - The findings and recommendations were discussed with                        the patient.                        - The findings and recommendations were discussed with                        the patient's family.                       - Await pathology results.                       - Follow an antireflux regimen.                       - After pathology results can consider Modified barium                        swallow vs manometry for dysphagia Procedure Code(s):    --- Professional ---                       [phone], Esophagogastroduodenoscopy, flexible, transoral;                        with biopsy, single or multiple Diagnosis Code(s):    --- Professional ---                       K22.8, Other specified diseases of esophagus                       Q39.9, Congenital malformation of esophagus, unspecified                       K44.9, Diaphragmatic hernia without obstruction or                        gangrene                       R13.10, Dysphagia, unspecified CPT copyright 2018 American Medical Association. All rights reserved. The codes documented in this report are preliminary and upon coder review may  be revised  to meet current compliance requirements.  [name], MD Margretta Sidle B. Bonna Gains MD, MD 04/23/2018 10:58:32 AM This report has been signed electronically. Number of Addenda: 0 Note Initiated On: 04/23/2018 10:40 AM Estimated Blood Loss: Estimated blood loss: none.      [facility]

## 2018-04-23 NOTE — Transfer of Care (Signed)
Immediate Anesthesia Transfer of Care Note  Patient: Rebecca Patterson  Procedure(s) Performed: ESOPHAGOGASTRODUODENOSCOPY (EGD) WITH PROPOFOL (N/A )  Patient Location: Endoscopy Unit  Anesthesia Type:General  Level of Consciousness: drowsy and patient cooperative  Airway & Oxygen Therapy: Patient Spontanous Breathing and Patient connected to nasal cannula oxygen  Post-op Assessment: Report given to RN and Post -op Vital signs reviewed and stable  Post vital signs: Reviewed and stable  Last Vitals:  Vitals Value Taken Time  BP 112/71 04/23/2018 10:59 AM  Temp 36.1 C 04/23/2018 10:58 AM  Pulse 73 04/23/2018 11:02 AM  Resp 25 04/23/2018 11:02 AM  SpO2 97 % 04/23/2018 11:02 AM  Vitals shown include unvalidated device data.  Last Pain:  Vitals:   04/23/18 1058  TempSrc: Tympanic  PainSc:          Complications: No apparent anesthesia complications

## 2018-04-23 NOTE — Anesthesia Post-op Follow-up Note (Signed)
Anesthesia QCDR form completed.        

## 2018-04-23 NOTE — Anesthesia Preprocedure Evaluation (Signed)
Anesthesia Evaluation  Patient identified by MRN, date of birth, ID band Patient awake    Reviewed: Allergy & Precautions, NPO status , Patient's Chart, lab work & pertinent test results, reviewed documented beta blocker date and time   History of Anesthesia Complications (+) PONV, Family history of anesthesia reaction and history of anesthetic complications  Airway Mallampati: II  TM Distance: >3 FB     Dental  (+) Upper Dentures, Lower Dentures   Pulmonary           Cardiovascular + dysrhythmias      Neuro/Psych    GI/Hepatic GERD  Controlled,  Endo/Other  diabetes, Type 2Hypothyroidism   Renal/GU      Musculoskeletal   Abdominal   Peds  Hematology   Anesthesia Other Findings Dec hearing. EKG ok. Parkinson's.  Reproductive/Obstetrics                             Anesthesia Physical Anesthesia Plan  ASA: III  Anesthesia Plan: General   Post-op Pain Management:    Induction: Intravenous  PONV Risk Score and Plan:   Airway Management Planned:   Additional Equipment:   Intra-op Plan:   Post-operative Plan:   Informed Consent: I have reviewed the patients History and Physical, chart, labs and discussed the procedure including the risks, benefits and alternatives for the proposed anesthesia with the patient or authorized representative who has indicated his/her understanding and acceptance.       Plan Discussed with: CRNA  Anesthesia Plan Comments:         Anesthesia Quick Evaluation

## 2018-04-23 NOTE — Anesthesia Postprocedure Evaluation (Signed)
Anesthesia Post Note  Patient: Rebecca Patterson  Procedure(s) Performed: ESOPHAGOGASTRODUODENOSCOPY (EGD) WITH PROPOFOL (N/A )  Patient location during evaluation: Endoscopy Anesthesia Type: General Level of consciousness: awake and alert Pain management: pain level controlled Vital Signs Assessment: post-procedure vital signs reviewed and stable Respiratory status: spontaneous breathing, nonlabored ventilation, respiratory function stable and patient connected to nasal cannula oxygen Cardiovascular status: blood pressure returned to baseline and stable Postop Assessment: no apparent nausea or vomiting Anesthetic complications: no     Last Vitals:  Vitals:   04/23/18 0942 04/23/18 1058  BP: 139/78 112/71  Pulse: 65   Resp: 17 18  Temp: 36.5 C (!) 36.1 C  SpO2: 100% 99%    Last Pain:  Vitals:   04/23/18 1108  TempSrc:   PainSc: 0-No pain                 Chera Slivka S

## 2018-04-24 LAB — SURGICAL PATHOLOGY

## 2018-04-25 ENCOUNTER — Telehealth: Payer: Self-pay

## 2018-04-25 ENCOUNTER — Telehealth: Payer: Self-pay | Admitting: Gastroenterology

## 2018-04-25 NOTE — Telephone Encounter (Signed)
Patient called stating she was returning call a call for her results from Wednesday upper endoscopy

## 2018-04-25 NOTE — Telephone Encounter (Signed)
Patient has been notified of normal biopsy results.  Advised her that Dr. Bonna Gains suggested to she have a swallowing study to evaluate swallowing.  Thanks [employer]

## 2018-04-25 NOTE — Telephone Encounter (Signed)
Patient called & l/m on v/m asking Rebecca Patterson to call her.

## 2018-04-25 NOTE — Telephone Encounter (Signed)
Patients call returned.  Results provided.  Thanks [employer]

## 2018-04-28 ENCOUNTER — Other Ambulatory Visit: Payer: Self-pay

## 2018-04-28 ENCOUNTER — Telehealth: Payer: Self-pay

## 2018-04-28 DIAGNOSIS — R1310 Dysphagia, unspecified: Secondary | ICD-10-CM

## 2018-04-28 DIAGNOSIS — R1319 Other dysphagia: Secondary | ICD-10-CM

## 2018-04-28 NOTE — Telephone Encounter (Signed)
Unable to contact patient this am.  She has been scheduled for her modified barium swallow with pathology on April 22 at [facility].  Arrival time12:30 pm.  She should eat a regular breakfast and light lunch.  Will keep trying to contact her to make her aware.  Thanks [employer]

## 2018-04-28 NOTE — Telephone Encounter (Signed)
Unable to contact patient this am.  She has been scheduled for her modified barium swallow with pathology on April 22 at [facility].  Arrival time12:30 pm.  She should eat a regular breakfast and light lunch.  Will keep trying to contact her to make her aware.  Thanks [employer]

## 2018-04-28 NOTE — Telephone Encounter (Signed)
Patient has been informed of her appt for her barium swallow for 05/28/18 at [facility] 12:30 pm.  She has been advised to eat a regular breakfast and a light lunch.  Thanks [employer]

## 2018-04-28 NOTE — Telephone Encounter (Signed)
-----   Message from Vanetta Mulders, [place] sent at 04/28/2018  8:41 AM EDT ----- Regarding: Barium Swallow Scheduled Patient has been scheduled for modified barium swallow on 04/22 at [facility].  Her arrival time is 12:30.  She should eat regular breakfast, and light lunch.  I was unable to get in touch with her to make her aware.  Please call to notify.

## 2018-05-07 ENCOUNTER — Telehealth: Payer: Self-pay

## 2018-05-07 NOTE — Telephone Encounter (Signed)
Patient has been contacted to advise that due to the [place] pandemic we are limiting the schedules to allow less exposure for patients and staff.  Asked pt if she would like to keep her appt as scheduled or reschedule.  She has decided to cancel for now and reschedule at a later time.  Thanks [employer]

## 2018-05-08 ENCOUNTER — Ambulatory Visit: Payer: Medicare Other | Admitting: Gastroenterology

## 2018-05-15 ENCOUNTER — Ambulatory Visit: Payer: Medicare Other | Admitting: Gastroenterology

## 2018-05-28 ENCOUNTER — Ambulatory Visit: Payer: Medicare Other

## 2018-06-01 ENCOUNTER — Other Ambulatory Visit: Payer: Self-pay | Admitting: Family Medicine

## 2018-06-11 DIAGNOSIS — H26492 Other secondary cataract, left eye: Secondary | ICD-10-CM | POA: Diagnosis not present

## 2018-06-11 DIAGNOSIS — H353132 Nonexudative age-related macular degeneration, bilateral, intermediate dry stage: Secondary | ICD-10-CM | POA: Diagnosis not present

## 2018-06-16 ENCOUNTER — Encounter (INDEPENDENT_AMBULATORY_CARE_PROVIDER_SITE_OTHER): Payer: Self-pay

## 2018-06-16 ENCOUNTER — Inpatient Hospital Stay (HOSPITAL_BASED_OUTPATIENT_CLINIC_OR_DEPARTMENT_OTHER): Payer: Medicare Other | Admitting: Internal Medicine

## 2018-06-16 ENCOUNTER — Other Ambulatory Visit: Payer: Self-pay

## 2018-06-16 ENCOUNTER — Other Ambulatory Visit: Payer: Self-pay | Admitting: *Deleted

## 2018-06-16 ENCOUNTER — Inpatient Hospital Stay: Payer: Medicare Other | Attending: Internal Medicine

## 2018-06-16 VITALS — BP 146/88 | HR 83 | Temp 99.8°F | Ht 62.0 in | Wt 137.0 lb

## 2018-06-16 DIAGNOSIS — Z823 Family history of stroke: Secondary | ICD-10-CM | POA: Insufficient documentation

## 2018-06-16 DIAGNOSIS — M810 Age-related osteoporosis without current pathological fracture: Secondary | ICD-10-CM | POA: Diagnosis not present

## 2018-06-16 DIAGNOSIS — Z170 Estrogen receptor positive status [ER+]: Secondary | ICD-10-CM | POA: Insufficient documentation

## 2018-06-16 DIAGNOSIS — Z7984 Long term (current) use of oral hypoglycemic drugs: Secondary | ICD-10-CM | POA: Insufficient documentation

## 2018-06-16 DIAGNOSIS — Z79899 Other long term (current) drug therapy: Secondary | ICD-10-CM | POA: Diagnosis not present

## 2018-06-16 DIAGNOSIS — C50811 Malignant neoplasm of overlapping sites of right female breast: Secondary | ICD-10-CM

## 2018-06-16 DIAGNOSIS — E785 Hyperlipidemia, unspecified: Secondary | ICD-10-CM | POA: Diagnosis not present

## 2018-06-16 DIAGNOSIS — E079 Disorder of thyroid, unspecified: Secondary | ICD-10-CM

## 2018-06-16 DIAGNOSIS — Z85828 Personal history of other malignant neoplasm of skin: Secondary | ICD-10-CM | POA: Insufficient documentation

## 2018-06-16 DIAGNOSIS — Z803 Family history of malignant neoplasm of breast: Secondary | ICD-10-CM | POA: Diagnosis not present

## 2018-06-16 DIAGNOSIS — Z853 Personal history of malignant neoplasm of breast: Secondary | ICD-10-CM | POA: Diagnosis not present

## 2018-06-16 LAB — COMPREHENSIVE METABOLIC PANEL
ALT: 5 U/L (ref 0–44)
AST: 14 U/L — ABNORMAL LOW (ref 15–41)
Albumin: 4.5 g/dL (ref 3.5–5.0)
Alkaline Phosphatase: 47 U/L (ref 38–126)
Anion gap: 11 (ref 5–15)
BUN: 17 mg/dL (ref 8–23)
CO2: 28 mmol/L (ref 22–32)
Calcium: 9.2 mg/dL (ref 8.9–10.3)
Chloride: 103 mmol/L (ref 98–111)
Creatinine, Ser: 0.64 mg/dL (ref 0.44–1.00)
GFR calc Af Amer: >60 mL/min (ref >60)
GFR calc non Af Amer: >60 mL/min (ref >60)
Glucose, Bld: 101 mg/dL — ABNORMAL HIGH (ref 70–99)
Potassium: 4.4 mmol/L (ref 3.5–5.1)
Sodium: 142 mmol/L (ref 135–145)
Total Bilirubin: 0.7 mg/dL (ref 0.3–1.2)
Total Protein: 6.8 g/dL (ref 6.5–8.1)

## 2018-06-16 LAB — CBC WITH DIFFERENTIAL/PLATELET
Abs Immature Granulocytes: 0.01 10*3/uL (ref 0.00–0.07)
Basophils Absolute: 0 10*3/uL (ref 0.0–0.1)
Basophils Relative: 1 %
Eosinophils Absolute: 0.1 10*3/uL (ref 0.0–0.5)
Eosinophils Relative: 1 %
HCT: 39.2 % (ref 36.0–46.0)
Hemoglobin: 12.7 g/dL (ref 12.0–15.0)
Immature Granulocytes: 0 %
Lymphocytes Relative: 39 %
Lymphs Abs: 2.3 10*3/uL (ref 0.7–4.0)
MCH: 30.2 pg (ref 26.0–34.0)
MCHC: 32.4 g/dL (ref 30.0–36.0)
MCV: 93.1 fL (ref 80.0–100.0)
Monocytes Absolute: 0.5 10*3/uL (ref 0.1–1.0)
Monocytes Relative: 8 %
Neutro Abs: 3 10*3/uL (ref 1.7–7.7)
Neutrophils Relative %: 51 %
Platelets: 220 10*3/uL (ref 150–400)
RBC: 4.21 MIL/uL (ref 3.87–5.11)
RDW: 12.6 % (ref 11.5–15.5)
WBC: 5.9 10*3/uL (ref 4.0–10.5)
nRBC: 0 % (ref 0.0–0.2)

## 2018-06-16 NOTE — Assessment & Plan Note (Addendum)
#  Stage I breast cancer-status post adjuvant Aromasin; finished May 2018.  #No evidence of recurrence.  Continue surveillance.  #BMD 2017 with osteoporosis --2.6; discussed re: risks/of progressive worsening of her osteoporosis.  Patient previously intolerant to oral bisphosphonates. Continue calcium vitamin D. Reluctant with Reclast Prolia [ cost.]  # DISPOSITION: # follow up in 12 months follow up/labs-cbc/cmp-Dr.B #mammogram in July 2020 bil diagnostic.

## 2018-06-16 NOTE — Progress Notes (Signed)
[place] OFFICE PROGRESS NOTE  Patient Care Team: Brita Romp Dionne Bucy, MD as PCP - General (Family Medicine) Leandrew Koyanagi, MD as Referring Physician (Ophthalmology) Cammie Sickle, MD as Consulting Physician (Internal Medicine) Samara Deist, DPM as Referring Physician (Podiatry)  Cancer Staging No matching staging information was found for the patient.   Oncology History   # 2013- Stage IA (pT1b pN0 (sn) cM0) grade 1 invasive mammary carcinoma of the right breast status post wide local excision and sentinel node biopsy on 04/05/11.; Tumor size 9 mm, grade 1, margins uninvolved by invasive carcinoma.  No DCIS.  1 sentinel lymph node negative;  ER/PR positive (90%),  HER-2/neu negative by FISH (HER2/CEP17 ratio 1.04). S/p RT; no chemo.   # Patient started anastrozole March 2013, changed to exemestane May 2013 due to side effects [until May 2018]  # OSTEOPOROSIS- Feb 2019- ca+vit D: Intol to oral Bisphosphonates; Reluctant to reclast/Prolia.   DIAGNOSIS: breast cancer  STAGE:   I      ;GOALS: cure  CURRENT/MOST RECENT THERAPY: surveillaince      Carcinoma of overlapping sites of right breast in female, estrogen receptor positive ([place])      INTERVAL HISTORY:  Rebecca Patterson 84 y.o.  female pleasant patient above history of stage I breast cancer ER PR positive HER-2/neu negative-status post aromatase inhibitor; finished May 2018 is here for follow-up.  Patient appetite is good.  No weight loss.  No nausea no vomiting.  No headaches.  No bone pain.  Review of Systems  Constitutional: Negative for chills, diaphoresis, fever, malaise/fatigue and weight loss.  HENT: Negative for nosebleeds and sore throat.   Eyes: Negative for double vision.  Respiratory: Negative for cough, hemoptysis, sputum production, shortness of breath and wheezing.   Cardiovascular: Negative for chest pain, palpitations, orthopnea and leg swelling.  Gastrointestinal:  Negative for abdominal pain, blood in stool, constipation, diarrhea, heartburn, melena, nausea and vomiting.  Genitourinary: Negative for dysuria, frequency and urgency.  Musculoskeletal: Negative for back pain and joint pain.  Skin: Negative.  Negative for itching and rash.  Neurological: Negative for dizziness, tingling, focal weakness, weakness and headaches.  Endo/Heme/Allergies: Does not bruise/bleed easily.  Psychiatric/Behavioral: Negative for depression. The patient is not nervous/anxious and does not have insomnia.       PAST MEDICAL HISTORY :  Past Medical History:  Diagnosis Date  . Allergy   . Breast cancer ([place]) 2013   Rt  . Cancer [facility]) 2013   Right Breast- radiation and Tamoxifen  . Complication of anesthesia   . Corn   . Family history of adverse reaction to anesthesia    sister - slow to wake  . Fibrocystic breast   . GERD (gastroesophageal reflux disease)   . Hyperlipidemia   . Osteoporosis   . Osteoporosis   . PONV (postoperative nausea and vomiting)   . Thyroid disease   . Vertigo    long ago  . Wears dentures    full upper and lower  . Wears hearing aid    bilateral    PAST SURGICAL HISTORY :   Past Surgical History:  Procedure Laterality Date  . ABDOMINAL HYSTERECTOMY    . APPENDECTOMY    . BREAST BIOPSY Right 2013   Stereo- Positive  . BREAST EXCISIONAL BIOPSY Right 2013  . BREAST LUMPECTOMY Right    2013  . BUNIONECTOMY Bilateral 2004  . 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 CATARACT EXTRACTION W/PHACO Right 11/10/2014   Procedure:  CATARACT EXTRACTION PHACO AND INTRAOCULAR LENS PLACEMENT (IOC);  Surgeon: Leandrew Koyanagi, MD;  Location: [place];  Service: Ophthalmology;  Laterality: Right;  . CHOLECYSTECTOMY    . ESOPHAGOGASTRODUODENOSCOPY (EGD) WITH PROPOFOL N/A 04/23/2018   Procedure: ESOPHAGOGASTRODUODENOSCOPY (EGD) WITH PROPOFOL;  Surgeon: Virgel Manifold, MD;  Location: [facility] ENDOSCOPY;  Service: Endoscopy;   Laterality: N/A;  pt prefers later in am  . KNEE SURGERY Right 2005  . Mastoid surgery Right   . SKIN CANCER EXCISION  03/17/2014   right side of face Dr. Aubery Lapping  . THYROID SURGERY N/A 1980   Goiter    FAMILY HISTORY :   Family History  Problem Relation Age of Onset  . Stroke Sister   . Breast cancer Sister 38  . Colon cancer Neg Hx     SOCIAL HISTORY:   Social History   Tobacco Use  . Smoking status: Never Smoker  . Smokeless tobacco: Never Used  Substance Use Topics  . Alcohol use: No  . Drug use: No    ALLERGIES:  is allergic to actonel [risedronate]; boniva [ibandronic acid]; and fosamax [alendronate].  MEDICATIONS:  Current Outpatient Medications  Medication Sig Dispense Refill  . aspirin EC 81 MG tablet Take 81 mg by mouth daily.    . calcium carbonate (OS-CAL) 600 MG TABS tablet Take 600 mg by mouth daily with breakfast.     . carbidopa-levodopa (SINEMET IR) 25-100 MG tablet TAKE 1 TABLET BY MOUTH THREE TIMES A DAY    . docusate sodium (COLACE) 100 MG capsule Take by mouth daily.    . famotidine (PEPCID) 20 MG tablet Take 20 mg by mouth as needed.     Marland Kitchen ketoconazole (NIZORAL) 2 % cream Apply 1 application topically daily as needed.     Marland Kitchen levothyroxine (SYNTHROID, LEVOTHROID) 50 MCG tablet TAKE 1 TABLET BY MOUTH EVERY DAY 90 tablet 3  . metFORMIN (GLUCOPHAGE) 500 MG tablet TAKE 1 TABLET (500 MG TOTAL) BY MOUTH 2 (TWO) TIMES DAILY WITH A MEAL. 180 tablet 1  . Multiple Vitamins-Minerals (PRESERVISION AREDS 2 PO) Take by mouth.    . nystatin cream (MYCOSTATIN) APPLY TO AFFECTED AREA TWICE A DAY AS NEEDED 60 g 2  . Omega-3 Fatty Acids (FISH OIL DOUBLE STRENGTH) 1200 MG CAPS Take 1 capsule by mouth daily.     . polyethylene glycol (MIRALAX / GLYCOLAX) packet Take 17 g by mouth daily.    . pravastatin (PRAVACHOL) 40 MG tablet TAKE 1 TABLET BY MOUTH EVERY DAY 90 tablet 4  . triamcinolone cream (KENALOG) 0.1 % APPLY TO AFFECTED AREA 3 TIMES A DAY FOR INSECT BITES  (Patient not taking: Reported on 06/16/2018) 60 g 5   No current facility-administered medications for this visit.     PHYSICAL EXAMINATION: ECOG PERFORMANCE STATUS: 0 - Asymptomatic  BP (!) 146/88 (Patient Position: Sitting, Cuff Size: Normal)   Pulse 83   Temp 99.8 F (37.7 C) (Tympanic)   Ht _0  (1.575 m)   Wt 137 lb (62.1 kg)   BMI 25.06 kg/m   Filed Weights   06/16/18 1407  Weight: 137 lb (62.1 kg)    Physical Exam  Constitutional: She is oriented to person, place, and time and well-developed, well-nourished, and in no distress.  HENT:  Head: Normocephalic and atraumatic.  Mouth/Throat: Oropharynx is clear and moist. No oropharyngeal exudate.  Eyes: Pupils are equal, round, and reactive to light.  Neck: Normal range of motion. Neck supple.  Cardiovascular: Normal rate and regular rhythm.  Pulmonary/Chest: No respiratory distress. She has no wheezes.  Abdominal: Soft. Bowel sounds are normal. She exhibits no distension and no mass. There is no abdominal tenderness. There is no rebound and no guarding.  Musculoskeletal: Normal range of motion.        General: No tenderness or edema.  Neurological: She is alert and oriented to person, place, and time.  Skin: Skin is warm.  Right and left BREAST exam (in the presence of nurse)- no unusual skin changes or dominant masses felt. Surgical scars noted.   Psychiatric: Affect normal.     LABORATORY DATA:  I have reviewed the data as listed    Component Value Date/Time   NA 142 06/16/2018 1337   NA 141 03/19/2018 1502   K 4.4 06/16/2018 1337   CL 103 06/16/2018 1337   CO2 28 06/16/2018 1337   GLUCOSE 101 (H) 06/16/2018 1337   BUN 17 06/16/2018 1337   BUN 16 03/19/2018 1502   CREATININE 0.64 06/16/2018 1337   CREATININE 0.85 11/04/2013 1029   CALCIUM 9.2 06/16/2018 1337   PROT 6.8 06/16/2018 1337   PROT 6.8 09/05/2017 1121   PROT 6.9 11/04/2013 1029   ALBUMIN 4.5 06/16/2018 1337   ALBUMIN 4.7 09/05/2017 1121    ALBUMIN 3.9 11/04/2013 1029   AST 14 (L) 06/16/2018 1337   AST 9 (L) 11/04/2013 1029   ALT 5 06/16/2018 1337   ALT 29 11/04/2013 1029   ALKPHOS 47 06/16/2018 1337   ALKPHOS 77 11/04/2013 1029   BILITOT 0.7 06/16/2018 1337   BILITOT 0.7 09/05/2017 1121   BILITOT 0.3 11/04/2013 1029   GFRNONAA >60 06/16/2018 1337   GFRNONAA >60 11/04/2013 1029   GFRNONAA >60 11/03/2012 1106   GFRAA >60 06/16/2018 1337   GFRAA >60 11/04/2013 1029   GFRAA >60 11/03/2012 1106    No results found for: SPEP, UPEP  Lab Results  Component Value Date   WBC 5.9 06/16/2018   NEUTROABS 3.0 06/16/2018   HGB 12.7 06/16/2018   HCT 39.2 06/16/2018   MCV 93.1 06/16/2018   PLT 220 06/16/2018      Chemistry      Component Value Date/Time   NA 142 06/16/2018 1337   NA 141 03/19/2018 1502   K 4.4 06/16/2018 1337   CL 103 06/16/2018 1337   CO2 28 06/16/2018 1337   BUN 17 06/16/2018 1337   BUN 16 03/19/2018 1502   CREATININE 0.64 06/16/2018 1337   CREATININE 0.85 11/04/2013 1029   GLU 101 07/01/2013      Component Value Date/Time   CALCIUM 9.2 06/16/2018 1337   ALKPHOS 47 06/16/2018 1337   ALKPHOS 77 11/04/2013 1029   AST 14 (L) 06/16/2018 1337   AST 9 (L) 11/04/2013 1029   ALT 5 06/16/2018 1337   ALT 29 11/04/2013 1029   BILITOT 0.7 06/16/2018 1337   BILITOT 0.7 09/05/2017 1121   BILITOT 0.3 11/04/2013 1029       RADIOGRAPHIC STUDIES: I have personally reviewed the radiological images as listed and agreed with the findings in the report. No results found.   ASSESSMENT & PLAN:  Carcinoma of overlapping sites of right breast in female, estrogen receptor positive ([place]) #Stage I breast cancer-status post adjuvant Aromasin; finished May 2018.  #No evidence of recurrence.  Continue surveillance.  #BMD 2017 with osteoporosis --2.6; discussed re: risks/of progressive worsening of her osteoporosis.  Patient previously intolerant to oral bisphosphonates. Continue calcium vitamin D. Reluctant with  Reclast Prolia [ cost.]  #  DISPOSITION: # follow up in 12 months follow up/labs-cbc/cmp-Dr.B #mammogram in July 2020 bil diagnostic.      No orders of the defined types were placed in this encounter.  All questions were answered. The patient knows to call the clinic with any problems, questions or concerns.      Cammie Sickle, MD 06/16/2018 2:22 PM

## 2018-06-19 DIAGNOSIS — H26492 Other secondary cataract, left eye: Secondary | ICD-10-CM | POA: Diagnosis not present

## 2018-06-25 ENCOUNTER — Other Ambulatory Visit: Payer: Self-pay | Admitting: *Deleted

## 2018-06-25 DIAGNOSIS — C50811 Malignant neoplasm of overlapping sites of right female breast: Secondary | ICD-10-CM

## 2018-06-25 DIAGNOSIS — Z170 Estrogen receptor positive status [ER+]: Secondary | ICD-10-CM

## 2018-08-04 ENCOUNTER — Ambulatory Visit
Admission: RE | Admit: 2018-08-04 | Discharge: 2018-08-04 | Disposition: A | Discharge status: Home / Self Care | Payer: Medicare Other | Source: Ambulatory Visit | Attending: Internal Medicine | Admitting: Internal Medicine

## 2018-08-04 ENCOUNTER — Other Ambulatory Visit: Payer: Self-pay

## 2018-08-04 DIAGNOSIS — Z170 Estrogen receptor positive status [ER+]: Secondary | ICD-10-CM | POA: Insufficient documentation

## 2018-08-04 DIAGNOSIS — C50811 Malignant neoplasm of overlapping sites of right female breast: Secondary | ICD-10-CM | POA: Insufficient documentation

## 2018-08-04 DIAGNOSIS — Z1231 Encounter for screening mammogram for malignant neoplasm of breast: Secondary | ICD-10-CM | POA: Insufficient documentation

## 2018-08-04 HISTORY — DX: Personal history of irradiation: Z92.3

## 2018-08-12 DIAGNOSIS — R0600 Dyspnea, unspecified: Secondary | ICD-10-CM | POA: Diagnosis not present

## 2018-08-12 DIAGNOSIS — G20 Parkinson's disease: Secondary | ICD-10-CM | POA: Diagnosis not present

## 2018-08-12 DIAGNOSIS — R002 Palpitations: Secondary | ICD-10-CM | POA: Diagnosis not present

## 2018-08-13 ENCOUNTER — Encounter: Payer: Self-pay | Admitting: Internal Medicine

## 2018-08-13 ENCOUNTER — Ambulatory Visit
Admission: RE | Admit: 2018-08-13 | Discharge: 2018-08-13 | Disposition: A | Discharge status: Home / Self Care | Payer: Medicare Other | Source: Ambulatory Visit | Attending: Internal Medicine | Admitting: Internal Medicine

## 2018-08-13 ENCOUNTER — Other Ambulatory Visit: Payer: Self-pay

## 2018-08-13 ENCOUNTER — Ambulatory Visit: Payer: Medicare Other | Admitting: Internal Medicine

## 2018-08-13 VITALS — BP 110/70 | HR 75 | Temp 97.5°F | Ht 60.0 in | Wt 129.0 lb

## 2018-08-13 DIAGNOSIS — R0602 Shortness of breath: Secondary | ICD-10-CM

## 2018-08-13 NOTE — Progress Notes (Signed)
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  Patient does have a history of irregular heartbeat patient to be evaluated by cardiology in the next couple weeks  Patient has a diagnosis of breast cancer status post therapy 6 years ago Patient does not have any acute respiratory distress at this time  Explained to patient that she will need several tests in the next several weeks for further assessment and diagnostic evaluation      PAST MEDICAL HISTORY :   has a past medical history of Allergy, Breast cancer ([place]) (2013), Cancer ([place]) (2778), Complication of anesthesia, Corn, Family history of adverse reaction to anesthesia, Fibrocystic breast, GERD (gastroesophageal reflux disease), Hyperlipidemia, Osteoporosis, Osteoporosis, Personal history of radiation therapy, PONV (postoperative nausea and vomiting), Thyroid disease, Vertigo, Wears dentures, and Wears hearing aid.  has a past surgical history that includes Abdominal hysterectomy; Cholecystectomy; Carotid body tumor excision (Right, 10/19/2005); Knee surgery (Right, 2005); Bunionectomy (Bilateral, 2004); Thyroid surgery (N/A, 1980); Mastoid surgery (Right); Skin cancer excision (03/17/2014); Cataract extraction w/PHACO (Right, 11/10/2014); Breast lumpectomy (Right); Appendectomy; Esophagogastroduodenoscopy (egd) with  propofol (N/A, 04/23/2018); Breast biopsy (Right, 2013); and Breast cyst excision (Right, n/a). Prior to Admission medications   Medication Sig Start Date End Date Taking? Authorizing Provider  aspirin EC 81 MG tablet Take 81 mg by mouth daily.    [provider]  calcium carbonate (OS-CAL) 600 MG TABS tablet Take 600 mg by mouth daily with breakfast.  01/20/10   [provider]  carbidopa-levodopa (SINEMET IR) 25-100 MG tablet TAKE 1 TABLET BY MOUTH THREE TIMES A DAY 12/02/17   [provider]  docusate sodium (COLACE) 100 MG capsule Take by mouth daily.    [provider]  famotidine (PEPCID) 20 MG tablet Take 20 mg by mouth as needed.     [provider]  ketoconazole (NIZORAL) 2 % cream Apply 1 application topically daily as needed.  08/07/15   [provider]  levothyroxine (SYNTHROID, LEVOTHROID) 50 MCG tablet TAKE 1 TABLET BY MOUTH EVERY DAY 03/03/18   Bacigalupo, Dionne Bucy, MD  metFORMIN (GLUCOPHAGE) 500 MG tablet TAKE 1 TABLET (500 MG TOTAL) BY MOUTH 2 (TWO) TIMES DAILY WITH A MEAL. 03/03/18   Virginia Crews, MD  Multiple Vitamins-Minerals (PRESERVISION AREDS 2 PO) Take by mouth.    [provider]  nystatin cream (MYCOSTATIN) APPLY TO AFFECTED AREA TWICE A DAY AS NEEDED 08/04/15   Margarita Rana, MD  Omega-3 Fatty Acids (FISH OIL DOUBLE STRENGTH) 1200 MG CAPS Take 1 capsule by mouth daily.  01/20/10   [provider]  polyethylene glycol (MIRALAX / GLYCOLAX) packet Take 17 g by mouth daily.    [provider]  pravastatin (PRAVACHOL) 40 MG tablet TAKE 1 TABLET BY MOUTH EVERY DAY 06/02/18   Virginia Crews, MD  triamcinolone cream (KENALOG) 0.1 % APPLY TO AFFECTED AREA 3 TIMES  A DAY FOR INSECT BITES Patient not taking: Reported on 06/16/2018 12/06/16   Birdie Sons, MD   Allergies  Allergen Reactions   Actonel [Risedronate]    Boniva [Ibandronic Acid]     shakes   Fosamax [Alendronate]      Patient reports medication caused her to severe mood changes.    FAMILY HISTORY:  family history includes Breast cancer in an other family member; Breast cancer (age of onset: 92) in her sister; Stroke in her sister. SOCIAL HISTORY:  reports that she has never smoked. She has never used smokeless tobacco. She reports that she does not drink alcohol or use drugs.    Review of Systems:  Gen:  Denies  fever, sweats, chills weigh loss  HEENT: Denies blurred vision, double vision, ear pain, eye pain, hearing loss, nose bleeds, sore throat Cardiac:  No dizziness, chest pain or heaviness, chest tightness,edema, No JVD Resp:   No cough, -sputum production, +shortness of breath,-wheezing, -hemoptysis,  Gi: Denies swallowing difficulty, stomach pain, nausea or vomiting, diarrhea, constipation, bowel incontinence Gu:  Denies bladder incontinence, burning urine Ext:   Denies Joint pain, stiffness or swelling Skin: Denies  skin rash, easy bruising or bleeding or hives Endoc:  Denies polyuria, polydipsia , polyphagia or weight change Psych:   Denies depression, insomnia or hallucinations  Other:  All other systems negative    Physical Examination:   GENERAL:NAD, no fevers, chills, no weakness no fatigue HEAD: Normocephalic, atraumatic.  EYES: PERLA, EOMI No scleral icterus.  NECK: Supple. No thyromegaly.  No JVD.  PULMONARY: CTA B/L no wheezing, rhonchi, crackles CARDIOVASCULAR: S1 and S2. Regular rate and rhythm. No murmurs GASTROINTESTINAL: Soft, nontender, nondistended. Positive bowel sounds.  MUSCULOSKELETAL: No swelling, clubbing, or edema.  Severe kyphosis NEUROLOGIC: No gross focal neurological deficits. 5/5 strength all extremities SKIN: No ulceration, lesions, rashes, or cyanosis.  PSYCHIATRIC: Insight, judgment intact. 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  The importance of social distancing was discussed today. Hand Washing Techniques and avoid touching face was advised.  MEDICATION ADJUSTMENTS/LABS AND TESTS ORDERED: 6-minute walk test Pulmonary function testing overnight pulse oximetry Chest x-ray PA lateral   CURRENT MEDICATIONS REVIEWED AT LENGTH WITH PATIENT TODAY   Patient satisfied with Plan of action and management. All questions answered  Follow up in 1 month   Celine Dishman Patricia Pesa, M.D.  [facility]  Medical Director [place] Director [facility] Cardio-Pulmonary Department

## 2018-08-13 NOTE — Patient Instructions (Addendum)
Check PFT's  Check 6MWT   Check Overnight Pulse Oximetry  Get CXR to assess lungs

## 2018-08-18 ENCOUNTER — Encounter: Payer: Self-pay | Admitting: Internal Medicine

## 2018-08-21 ENCOUNTER — Telehealth: Payer: Self-pay | Admitting: Internal Medicine

## 2018-08-21 DIAGNOSIS — R0602 Shortness of breath: Secondary | ICD-10-CM | POA: Diagnosis not present

## 2018-08-21 DIAGNOSIS — E119 Type 2 diabetes mellitus without complications: Secondary | ICD-10-CM | POA: Diagnosis not present

## 2018-08-21 DIAGNOSIS — R0789 Other chest pain: Secondary | ICD-10-CM | POA: Diagnosis not present

## 2018-08-21 DIAGNOSIS — R002 Palpitations: Secondary | ICD-10-CM | POA: Diagnosis not present

## 2018-08-21 DIAGNOSIS — R079 Chest pain, unspecified: Secondary | ICD-10-CM | POA: Diagnosis not present

## 2018-08-21 NOTE — Telephone Encounter (Signed)
Called and spoke with patient and she stated that Julianne Rice with [facility] called and spoke with her.  Device will be picked up tomorrow 08/22/2018.  Nothing else needed at this time. Rhonda J Cobb

## 2018-08-21 NOTE — Telephone Encounter (Signed)
Rebecca Patterson, can you assist with this. Thanks

## 2018-08-21 NOTE — Telephone Encounter (Signed)
Spoke with Maudie Mercury at [facility] and she is going to contact patient to advise when [facility] will pick up the device. Rhonda J Cobb

## 2018-08-25 ENCOUNTER — Other Ambulatory Visit: Payer: Self-pay | Admitting: Neurology

## 2018-08-25 ENCOUNTER — Telehealth: Payer: Self-pay

## 2018-08-25 DIAGNOSIS — R0602 Shortness of breath: Secondary | ICD-10-CM | POA: Diagnosis not present

## 2018-08-25 NOTE — Telephone Encounter (Signed)
Called patient for COVID-19 pre-screening for in office visit.  Have you recently traveled any where out of the local area in the last 2 weeks? no  Have you been in close contact with a person diagnosed with COVID-19 or someone awaiting results within the last 2 weeks? no  Do you currently have any of the following symptoms? If so, when did they start? Cough     Diarrhea   Joint Pain Fever      Muscle Pain   Red eyes Shortness of breath-baseline present x50mo   Abdominal pain  Vomiting Loss of smell    Rash    Sore Throat Headache    Weakness   Bruising or bleeding   Okay to proceed with visit. (date)

## 2018-08-26 ENCOUNTER — Telehealth: Payer: Self-pay | Admitting: Internal Medicine

## 2018-08-26 ENCOUNTER — Ambulatory Visit (INDEPENDENT_AMBULATORY_CARE_PROVIDER_SITE_OTHER): Payer: Medicare Other

## 2018-08-26 ENCOUNTER — Other Ambulatory Visit: Payer: Self-pay

## 2018-08-26 ENCOUNTER — Other Ambulatory Visit: Payer: Self-pay | Admitting: Neurology

## 2018-08-26 DIAGNOSIS — R1313 Dysphagia, pharyngeal phase: Secondary | ICD-10-CM

## 2018-08-26 DIAGNOSIS — R0609 Other forms of dyspnea: Secondary | ICD-10-CM

## 2018-08-26 DIAGNOSIS — G4734 Idiopathic sleep related nonobstructive alveolar hypoventilation: Secondary | ICD-10-CM

## 2018-08-26 NOTE — Progress Notes (Signed)
SIX MIN WALK 08/26/2018  Medications no meds taken  Supplimental Oxygen during Test? (L/min) No  Laps 12  Partial Lap (in Meters) 0  Baseline BP (sitting) 122/64  Baseline Heartrate 93  Baseline Dyspnea (Borg Scale) 0  Baseline Fatigue (Borg Scale) 0  Baseline SPO2 97  BP (sitting) 136/78  Heartrate 102  Dyspnea (Borg Scale) 2  Fatigue (Borg Scale) 0.5  SPO2 94  BP (sitting) 130/72  Heartrate 79  SPO2 99  Distance Completed 408  Tech Comments: pt completed test at moderate pace with c/o sob and mild calf pain. sx subsided with rest.

## 2018-08-26 NOTE — Telephone Encounter (Signed)
Pt called back and wished to proceed with night time oxygen. Order has been placed. Nothing further is needed.

## 2018-08-26 NOTE — Telephone Encounter (Signed)
ONO reviewed by Dr. Mortimer Fries- recommend 1L [place] QHS.  Lowest spO2 79% with 95.22min <88%. Pt is aware of results and voiced her understanding.  Pt wished to call our office back for update, as she would like to think about proceeding with oxygen.

## 2018-08-27 ENCOUNTER — Telehealth: Payer: Self-pay | Admitting: Internal Medicine

## 2018-08-27 DIAGNOSIS — R0602 Shortness of breath: Secondary | ICD-10-CM

## 2018-08-27 NOTE — Telephone Encounter (Signed)
Routing to [place] per her request.

## 2018-08-27 NOTE — Telephone Encounter (Addendum)
Called and spoke [place] with [facility].  Jeani Hawking is requesting dx for night time oxygen. Dx must be listed in pt's problem list.  Sob is not accepted.  If COPD or emphysema is correct dx , pt will need to try and fail alternative such as inhalers prior to insurance covering oxygen.   DK please advise. Thanks

## 2018-08-27 NOTE — Telephone Encounter (Signed)
If the diagnosis is COPD or emphysema [facility] does require that at some point a patient has tried and failed either an inhaler or nebulizer medications to see if the patient improves on this therapy prior to 02 being ordered. Usually at some point, the patient has tried and failed some sort of therapy either by pulmonary or primary care physician. Rhonda J Cobb

## 2018-08-27 NOTE — Telephone Encounter (Signed)
Lm for Rebecca Patterson with [facility] to return our call.

## 2018-08-28 ENCOUNTER — Other Ambulatory Visit: Payer: Self-pay | Admitting: Family Medicine

## 2018-08-28 NOTE — Telephone Encounter (Signed)
I am waiting for PFT and CT chest for complete assessment

## 2018-08-28 NOTE — Telephone Encounter (Signed)
Pt is aware of dx being needed for oxygen and CT.  She voiced her understanding and had no further questions. Nothing further is needed at this time.

## 2018-08-28 NOTE — Telephone Encounter (Signed)
Pt is calling back [phone]

## 2018-08-28 NOTE — Telephone Encounter (Signed)
PFT is scheduled for 09/10/2018. Will await results.  I do not see that CT has been ordered? Would you like to order CT?

## 2018-08-28 NOTE — Telephone Encounter (Signed)
Really?  Please order CT chest without contrast Assess for ILD

## 2018-08-28 NOTE — Telephone Encounter (Signed)
CT has been ordered.  Lm to make pt aware of below information regarding oxygen.

## 2018-08-29 ENCOUNTER — Other Ambulatory Visit: Payer: Self-pay

## 2018-08-29 DIAGNOSIS — R079 Chest pain, unspecified: Secondary | ICD-10-CM | POA: Diagnosis not present

## 2018-08-29 DIAGNOSIS — R0602 Shortness of breath: Secondary | ICD-10-CM | POA: Diagnosis not present

## 2018-08-29 DIAGNOSIS — R9431 Abnormal electrocardiogram [ECG] [EKG]: Secondary | ICD-10-CM | POA: Diagnosis not present

## 2018-09-02 ENCOUNTER — Telehealth: Payer: Self-pay | Admitting: Internal Medicine

## 2018-09-02 NOTE — Telephone Encounter (Signed)
Pt is aware of date/time of covid test and voiced her understanding.  Nothing further is needed.  

## 2018-09-04 DIAGNOSIS — R0602 Shortness of breath: Secondary | ICD-10-CM | POA: Diagnosis not present

## 2018-09-04 DIAGNOSIS — R079 Chest pain, unspecified: Secondary | ICD-10-CM | POA: Diagnosis not present

## 2018-09-04 DIAGNOSIS — G20 Parkinson's disease: Secondary | ICD-10-CM | POA: Diagnosis not present

## 2018-09-04 DIAGNOSIS — R9431 Abnormal electrocardiogram [ECG] [EKG]: Secondary | ICD-10-CM | POA: Diagnosis not present

## 2018-09-04 DIAGNOSIS — E119 Type 2 diabetes mellitus without complications: Secondary | ICD-10-CM | POA: Diagnosis not present

## 2018-09-05 ENCOUNTER — Other Ambulatory Visit: Payer: Self-pay

## 2018-09-05 ENCOUNTER — Other Ambulatory Visit
Admission: RE | Admit: 2018-09-05 | Discharge: 2018-09-05 | Disposition: A | Discharge status: Home / Self Care | Payer: Medicare Other | Source: Ambulatory Visit | Attending: Internal Medicine | Admitting: Internal Medicine

## 2018-09-05 DIAGNOSIS — Z01812 Encounter for preprocedural laboratory examination: Secondary | ICD-10-CM | POA: Diagnosis not present

## 2018-09-05 DIAGNOSIS — Z20828 Contact with and (suspected) exposure to other viral communicable diseases: Secondary | ICD-10-CM | POA: Diagnosis not present

## 2018-09-06 LAB — SARS CORONAVIRUS 2 (TAT 6-24 HRS): SARS Coronavirus 2: NEGATIVE

## 2018-09-08 ENCOUNTER — Other Ambulatory Visit: Payer: Self-pay

## 2018-09-08 ENCOUNTER — Ambulatory Visit
Admission: RE | Admit: 2018-09-08 | Discharge: 2018-09-08 | Disposition: A | Discharge status: Home / Self Care | Payer: Medicare Other | Source: Ambulatory Visit | Attending: Internal Medicine | Admitting: Internal Medicine

## 2018-09-08 DIAGNOSIS — R0602 Shortness of breath: Secondary | ICD-10-CM

## 2018-09-09 ENCOUNTER — Ambulatory Visit (INDEPENDENT_AMBULATORY_CARE_PROVIDER_SITE_OTHER): Payer: Medicare Other | Admitting: Family Medicine

## 2018-09-09 ENCOUNTER — Encounter: Payer: Self-pay | Admitting: Family Medicine

## 2018-09-09 ENCOUNTER — Other Ambulatory Visit: Payer: Self-pay | Admitting: Family Medicine

## 2018-09-09 ENCOUNTER — Telehealth: Payer: Self-pay | Admitting: Internal Medicine

## 2018-09-09 ENCOUNTER — Ambulatory Visit (INDEPENDENT_AMBULATORY_CARE_PROVIDER_SITE_OTHER): Payer: Medicare Other

## 2018-09-09 ENCOUNTER — Other Ambulatory Visit: Payer: Self-pay

## 2018-09-09 VITALS — BP 122/66 | HR 95 | Temp 97.9°F | Ht 60.0 in | Wt 124.2 lb

## 2018-09-09 DIAGNOSIS — M810 Age-related osteoporosis without current pathological fracture: Secondary | ICD-10-CM

## 2018-09-09 DIAGNOSIS — E785 Hyperlipidemia, unspecified: Secondary | ICD-10-CM | POA: Diagnosis not present

## 2018-09-09 DIAGNOSIS — E119 Type 2 diabetes mellitus without complications: Secondary | ICD-10-CM

## 2018-09-09 DIAGNOSIS — E1169 Type 2 diabetes mellitus with other specified complication: Secondary | ICD-10-CM | POA: Diagnosis not present

## 2018-09-09 DIAGNOSIS — Z0000 Encounter for general adult medical examination without abnormal findings: Secondary | ICD-10-CM | POA: Diagnosis not present

## 2018-09-09 DIAGNOSIS — E039 Hypothyroidism, unspecified: Secondary | ICD-10-CM

## 2018-09-09 DIAGNOSIS — R634 Abnormal weight loss: Secondary | ICD-10-CM | POA: Diagnosis not present

## 2018-09-09 DIAGNOSIS — G20 Parkinson's disease: Secondary | ICD-10-CM

## 2018-09-09 NOTE — Telephone Encounter (Signed)
Called and spoke to pt's daughter, Tamela Oddi. Tamela Oddi is questioning what test is scheduled for 10/08/2018. I have made Doris aware that per our records, DG swallow test is scheduled.  Doris voiced her understanding.

## 2018-09-09 NOTE — Assessment & Plan Note (Signed)
Followed by Neurology Recently started on Zoloft Continue Sinemet Advised patient to mention her weight loss to Dr Manuella Ghazi as well as this may contribute

## 2018-09-09 NOTE — Assessment & Plan Note (Signed)
New problem ~10% weight loss in last 3 months Decreased appetite Could be related to depression symptoms, for which she was just started on Zoloft by neurology  Will check labs to ensure no other contributing factors  Will keep log of weights Does have a lymph node and some pulm nodules on recent CT chest - will need repeat in 6-12 months If continues to have weight loss, may need to consider further imaging or Oncology referral

## 2018-09-09 NOTE — Patient Instructions (Signed)
Preventive Care 82 Years and Older, Female Preventive care refers to lifestyle choices and visits with your health care provider that can promote health and wellness. This includes:  A yearly physical exam. This is also called an annual well check.  Regular dental and eye exams.  Immunizations.  Screening for certain conditions.  Healthy lifestyle choices, such as diet and exercise. What can I expect for my preventive care visit? Physical exam Your health care provider will check:  Height and weight. These may be used to calculate body mass index (BMI), which is a measurement that tells if you are at a healthy weight.  Heart rate and blood pressure.  Your skin for abnormal spots. Counseling Your health care provider may ask you questions about:  Alcohol, tobacco, and drug use.  Emotional well-being.  Home and relationship well-being.  Sexual activity.  Eating habits.  History of falls.  Memory and ability to understand (cognition).  Work and work Statistician.  Pregnancy and menstrual history. What immunizations do I need?  Influenza (flu) vaccine  This is recommended every year. Tetanus, diphtheria, and pertussis (Tdap) vaccine  You may need a Td booster every 10 years. Varicella (chickenpox) vaccine  You may need this vaccine if you have not already been vaccinated. Zoster (shingles) vaccine  You may need this after age 33. Pneumococcal conjugate (PCV13) vaccine  One dose is recommended after age 33. Pneumococcal polysaccharide (PPSV23) vaccine  One dose is recommended after age 72. Measles, mumps, and rubella (MMR) vaccine  You may need at least one dose of MMR if you were born in 1957 or later. You may also need a second dose. Meningococcal conjugate (MenACWY) vaccine  You may need this if you have certain conditions. Hepatitis A vaccine  You may need this if you have certain conditions or if you travel or work in places where you may be exposed  to hepatitis A. Hepatitis B vaccine  You may need this if you have certain conditions or if you travel or work in places where you may be exposed to hepatitis B. Haemophilus influenzae type b (Hib) vaccine  You may need this if you have certain conditions. You may receive vaccines as individual doses or as more than one vaccine together in one shot (combination vaccines). Talk with your health care provider about the risks and benefits of combination vaccines. What tests do I need? Blood tests  Lipid and cholesterol levels. These may be checked every 5 years, or more frequently depending on your overall health.  Hepatitis C test.  Hepatitis B test. Screening  Lung cancer screening. You may have this screening every year starting at age 39 if you have a 30-pack-year history of smoking and currently smoke or have quit within the past 15 years.  Colorectal cancer screening. All adults should have this screening starting at age 36 and continuing until age 15. Your health care provider may recommend screening at age 23 if you are at increased risk. You will have tests every 1-10 years, depending on your results and the type of screening test.  Diabetes screening. This is done by checking your blood sugar (glucose) after you have not eaten for a while (fasting). You may have this done every 1-3 years.  Mammogram. This may be done every 1-2 years. Talk with your health care provider about how often you should have regular mammograms.  BRCA-related cancer screening. This may be done if you have a family history of breast, ovarian, tubal, or peritoneal cancers.  Other tests  Sexually transmitted disease (STD) testing.  Bone density scan. This is done to screen for osteoporosis. You may have this done starting at age 76. Follow these instructions at home: Eating and drinking  Eat a diet that includes fresh fruits and vegetables, whole grains, lean protein, and low-fat dairy products. Limit  your intake of foods with high amounts of sugar, saturated fats, and salt.  Take vitamin and mineral supplements as recommended by your health care provider.  Do not drink alcohol if your health care provider tells you not to drink.  If you drink alcohol: ? Limit how much you have to 0-1 drink a day. ? Be aware of how much alcohol is in your drink. In the [place] one drink equals one 12 oz bottle of beer (355 mL), one 5 oz glass of wine (148 mL), or one 1 oz glass of hard liquor (44 mL). Lifestyle  Take daily care of your teeth and gums.  Stay active. Exercise for at least 30 minutes on 5 or more days each week.  Do not use any products that contain nicotine or tobacco, such as cigarettes, e-cigarettes, and chewing tobacco. If you need help quitting, ask your health care provider.  If you are sexually active, practice safe sex. Use a condom or other form of protection in order to prevent STIs (sexually transmitted infections).  Talk with your health care provider about taking a low-dose aspirin or statin. What's next?  Go to your health care provider once a year for a well check visit.  Ask your health care provider how often you should have your eyes and teeth checked.  Stay up to date on all vaccines. This information is not intended to replace advice given to you by your health care provider. Make sure you discuss any questions you have with your health care provider. Document Released: 02/18/2015 Document Revised: 01/16/2018 Document Reviewed: 01/16/2018 Elsevier Patient Education  2020 [employer].

## 2018-09-09 NOTE — Assessment & Plan Note (Signed)
Last LDL at goal Continue pravastatin Repeat FLP and CMP

## 2018-09-09 NOTE — Assessment & Plan Note (Signed)
Getting plenty of calcium in her diet OK to stop Ca supplement

## 2018-09-09 NOTE — Progress Notes (Signed)
Patient: Rebecca Patterson, Female    DOB: 03-01-1934, 84 y.o.   MRN: [id number] Visit Date: 09/09/2018  Today's Provider: Lavon Paganini, MD   Chief Complaint  Patient presents with  . Annual Exam   Subjective:   Patient had a AWE with Rebecca Patterson prior to appointment.    Annual physical exam Rebecca Patterson is a 84 y.o. female who presents today for health maintenance and complete physical. She feels well. She reports exercising lightly. She reports she is sleeping well.  ----------------------------------------------------------------- Reports unintentional weight loss and loss of appetite for last few months.  Wants to take less medications  Taking synthroid with good compliance  Seeing Rebecca Patterson and had CT chest yesterday  Review of Systems  Constitutional: Positive for unexpected weight change.  HENT: Negative.   Eyes: Negative.   Respiratory: Positive for shortness of breath.   Cardiovascular: Negative.   Gastrointestinal: Negative.   Endocrine: Negative.   Genitourinary: Negative.   Musculoskeletal: Negative.   Skin: Negative.   Allergic/Immunologic: Negative.   Neurological: Negative.   Hematological: Negative.   Psychiatric/Behavioral: Negative.     Social History      She  reports that she has never smoked. She has never used smokeless tobacco. She reports that she does not drink alcohol or use drugs.       Social History   Socioeconomic History  . Marital status: Married    Spouse name: Rebecca Patterson  . Number of children: 2  . Years of education: Not on file  . Highest education level: 12th grade  Occupational History  . Occupation: retired  Scientific laboratory technician  . Financial resource strain: Not hard at all  . Food insecurity    Worry: Never true    Inability: Never true  . Transportation needs    Medical: No    Non-medical: No  Tobacco Use  . Smoking status: Never Smoker  . Smokeless tobacco: Never Used  Substance and Sexual Activity  . Alcohol  use: No  . Drug use: No  . Sexual activity: Not on file  Lifestyle  . Physical activity    Days per week: 0 days    Minutes per session: 0 min  . Stress: Only a little  Relationships  . Social Herbalist on phone: Patient refused    Gets together: Patient refused    Attends religious service: Patient refused    Active member of club or organization: Patient refused    Attends meetings of clubs or organizations: Patient refused    Relationship status: Patient refused  Other Topics Concern  . Not on file  Social History Narrative  . Not on file    Past Medical History:  Diagnosis Date  . Allergy   . Breast cancer ([place]) 2013   Rt  . Cancer [facility]) 2013   Right Breast- radiation and Tamoxifen  . Complication of anesthesia   . Corn   . Family history of adverse reaction to anesthesia    sister - slow to wake  . Fibrocystic breast   . GERD (gastroesophageal reflux disease)   . Hyperlipidemia   . Osteoporosis   . Osteoporosis   . Personal history of radiation therapy   . PONV (postoperative nausea and vomiting)   . Thyroid disease   . Vertigo    long ago  . Wears dentures    full upper and lower  . Wears hearing aid    bilateral  Patient Active Problem List   Diagnosis Date Noted  . Hiatal hernia   . Dysphagia 03/19/2018  . Parkinson disease ([place]) 09/09/2017  . Carcinoma of overlapping sites of right breast in female, estrogen receptor positive ([place]) 11/16/2015  . Awareness of heartbeats 08/17/2014  . 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 Cardiac conduction disorder 06/17/2014  . T2DM (type 2 diabetes mellitus) ([place]) 06/17/2014  . Hyperlipidemia associated with type 2 diabetes mellitus ([place]) 06/17/2014  . Adult hypothyroidism 06/17/2014  . Osteoporosis 06/17/2014  . 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 Avitaminosis D 06/17/2014    Past Surgical History:  Procedure Laterality Date  . ABDOMINAL HYSTERECTOMY    . APPENDECTOMY    . BREAST BIOPSY Right 2013    Stereo- Positive  . BREAST CYST EXCISION Right n/a   cyst removed  . BREAST LUMPECTOMY Right    2013  . BUNIONECTOMY Bilateral 2004  . 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 CATARACT EXTRACTION W/PHACO Right 11/10/2014   Procedure: CATARACT EXTRACTION PHACO AND INTRAOCULAR LENS PLACEMENT (IOC);  Surgeon: Leandrew Koyanagi, MD;  Location: [facility];  Service: Ophthalmology;  Laterality: Right;  . CHOLECYSTECTOMY    . ESOPHAGOGASTRODUODENOSCOPY (EGD) WITH PROPOFOL N/A 04/23/2018   Procedure: ESOPHAGOGASTRODUODENOSCOPY (EGD) WITH PROPOFOL;  Surgeon: Virgel Manifold, MD;  Location: [facility] END[facility]COPY;  Service: Endoscopy;  Laterality: N/A;  pt prefers later in am  . KNEE SURGERY Right 2005  . Mastoid surgery Right   . SKIN CANCER EXCISION  03/17/2014   right side of face Rebecca. Aubery Lapping  . THYROID SURGERY N/A 1980   Goiter    Family History        Family Status  Relation Name Status  . Sister louise Deceased  . Sister Leisure centre manager  . Mother  Deceased       heart disease  . Father  Deceased       CAD  . Brother cecil Deceased at age 1 months  . Sister [employer]  . Brother Financial controller  . Brother Animal nutritionist  . Daughter  Alive       ovarian mass  . Other nieces X2 Alive  . Neg Hx  (Not Specified)        Her family history includes Breast cancer in an other family member; Breast cancer (age of onset: 31) in her sister; Stroke in her sister. There is no history of Colon cancer.      Allergies  Allergen Reactions  . Actonel [Risedronate]   . Boniva [Ibandronic Acid]     shakes  . Fosamax [Alendronate]     Patient reports medication caused her to severe mood changes.     Current Outpatient Medications:  .  aspirin EC 81 MG tablet, Take 81 mg by mouth daily., Disp: , Rfl:  .  calcium carbonate (OS-CAL) 600 MG TABS tablet, Take 600 mg by mouth daily with breakfast. , Disp: , Rfl:  .  carbidopa-levodopa (SINEMET IR) 25-100 MG tablet, Take 1.5 tablets by  mouth 3 (three) times daily. , Disp: , Rfl:  .  docusate sodium (COLACE) 100 MG capsule, Take by mouth daily., Disp: , Rfl:  .  famotidine (PEPCID) 20 MG tablet, Take 20 mg by mouth as needed. , Disp: , Rfl:  .  ketoconazole (NIZORAL) 2 % cream, Apply 1 application topically daily as needed. , Disp: , Rfl:  .  levothyroxine (SYNTHROID, LEVOTHROID) 50 MCG tablet, TAKE 1 TABLET BY MOUTH EVERY DAY, Disp: 90 tablet, Rfl: 3 .  metFORMIN (GLUCOPHAGE) 500 MG tablet, TAKE 1 TABLET (500 MG TOTAL) BY MOUTH 2 (TWO) TIMES DAILY WITH A MEAL., Disp: 180 tablet, Rfl: 1 .  Multiple Vitamins-Minerals (PRESERVISION AREDS 2 PO), Take by mouth 2 (two) times daily. , Disp: , Rfl:  .  nystatin cream (MYCOSTATIN), APPLY TO AFFECTED AREA TWICE A DAY AS NEEDED, Disp: 60 g, Rfl: 2 .  Omega-3 Fatty Acids (FISH OIL DOUBLE STRENGTH) 1200 MG CAPS, Take 1 capsule by mouth daily. , Disp: , Rfl:  .  polyethylene glycol (MIRALAX / GLYCOLAX) packet, Take 17 g by mouth daily as needed. , Disp: , Rfl:  .  pravastatin (PRAVACHOL) 40 MG tablet, TAKE 1 TABLET BY MOUTH EVERY DAY, Disp: 90 tablet, Rfl: 4 .  sertraline (ZOLOFT) 25 MG tablet, Take 25 mg by mouth daily., Disp: , Rfl:  .  triamcinolone cream (KENALOG) 0.1 %, APPLY TO AFFECTED AREA 3 TIMES A DAY FOR INSECT BITES (Patient taking differently: Apply 1 application topically as needed. ), Disp: 60 g, Rfl: 5   Patient Care Team: Virginia Crews, MD as PCP - General (Family Medicine) Leandrew Koyanagi, MD as Referring Physician (Ophthalmology) Cammie Sickle, MD as Consulting Physician (Internal Medicine) Isaias Cowman, MD as Consulting Physician (Cardiology) Flora Lipps, MD as Consulting Physician (Pulmonary Disease)    Objective:    Vitals: BP 122/66 (BP Location: Right Arm, Patient Position: Sitting, Cuff Size: Normal)   Pulse 95   Temp 97.9 F (36.6 C) (Oral)   Ht 5' (1.524 m)   Wt 124 lb 3.2 oz (56.3 kg)   BMI 24.26 kg/m    Vitals:    09/09/18 1052  BP: 122/66  Pulse: 95  Temp: 97.9 F (36.6 C)  TempSrc: Oral  Weight: 124 lb 3.2 oz (56.3 kg)  Height: 5' (1.524 m)     Physical Exam Vitals signs reviewed.  Constitutional:      General: She is not in acute distress.    Appearance: Normal appearance. She is well-developed. She is not diaphoretic.  HENT:     Head: Normocephalic and atraumatic.     Right Ear: External ear normal.     Left Ear: External ear normal.  Eyes:     General: No scleral icterus.    Extraocular Movements: Extraocular movements intact.     Conjunctiva/sclera: Conjunctivae normal.     Pupils: Pupils are equal, round, and reactive to light.  Neck:     Musculoskeletal: Neck supple.     Thyroid: No thyromegaly.  Cardiovascular:     Rate and Rhythm: Normal rate and regular rhythm.     Pulses: Normal pulses.     Heart sounds: Normal heart sounds. No murmur.  Pulmonary:     Effort: Pulmonary effort is normal. No respiratory distress.     Breath sounds: Normal breath sounds. No wheezing or rales.  Abdominal:     General: There is no distension.     Palpations: Abdomen is soft.     Tenderness: There is no abdominal tenderness.  Musculoskeletal:        General: No deformity.     Right lower leg: No edema.     Left lower leg: No edema.  Lymphadenopathy:     Cervical: No cervical adenopathy.  Skin:    General: Skin is warm and dry.     Capillary Refill: Capillary refill takes less than 2 seconds.     Findings: No rash.  Neurological:     Mental Status: She is alert  and oriented to person, place, and time. Mental status is at baseline.  Psychiatric:        Mood and Affect: Mood normal.        Behavior: Behavior normal.        Thought Content: Thought content normal.      Depression Screen PHQ 2/9 Scores 09/09/2018 09/05/2017 09/05/2017 08/29/2016  PHQ - 2 Score 0 0 0 0  PHQ- 9 Score - 0 - 0       Assessment & Plan:     Routine Health Maintenance and Physical Exam  Exercise  Activities and Dietary recommendations Goals    . Exercise 150 minutes per week (moderate activity)    . water intake     Continue drinking 6-8 glasses of water a day.        Immunization History  Administered Date(s) Administered  . Influenza, High Dose Seasonal PF 11/11/2015, 10/10/2016, 12/09/2017  . Influenza,inj,Quad PF,6+ Mos 02/18/2015  . Influenza-Unspecified 10/06/2013  . Pneumococcal Conjugate-13 10/21/2013  . Pneumococcal Polysaccharide-23 01/20/2010  . Td 09/03/2005    Health Maintenance  Topic Date Due  . INFLUENZA VACCINE  09/06/2018  . TETANUS/TDAP  02/05/2026 (Originally 09/04/2015)  . HEMOGLOBIN A1C  09/17/2018  . OPHTHALMOLOGY EXAM  11/12/2018  . FOOT EXAM  12/10/2018  . URINE MICROALBUMIN  12/10/2018  . DEXA SCAN  02/19/2019  . PNA vac Low Risk Adult  Completed     Discussed health benefits of physical activity, and encouraged her to engage in regular exercise appropriate for her age and condition.    --------------------------------------------------------------------  Problem List Items Addressed This Visit      Endocrine   T2DM (type 2 diabetes mellitus) ([place])    Prviously well controleld Repeat A1c - if still controlled will stop metformin UTD on vaccines, eyeexam, foot exam On statin Discussed diet and exercise F/u in 6 months      Relevant Orders   Hemoglobin A1c   Hyperlipidemia associated with type 2 diabetes mellitus ([facility])    Last LDL at goal Continue pravastatin Repeat FLP and CMP      Relevant Orders   Lipid panel   Adult hypothyroidism    Previously well controlled Could contribute to recent weight loss Recheck TSH Continue synthroid      Relevant Orders   TSH     Nervous and Auditory   Parkinson disease ([place])    Followed by Neurology Recently started on Zoloft Continue Sinemet Advised patient to mention her weight loss to Rebecca Manuella Ghazi as well as this may contribute        Musculoskeletal and Integument    Osteoporosis    Getting plenty of calcium in her diet OK to stop Ca supplement        Other   Unintentional weight loss    New problem ~10% weight loss in last 3 months Decreased appetite Could be related to depression symptoms, for which she was just started on Zoloft by neurology  Will check labs to ensure no other contributing factors  Will keep log of weights Does have a lymph node and some pulm nodules on recent CT chest - will need repeat in 6-12 months If continues to have weight loss, may need to consider further imaging or Oncology referral      Relevant Orders   CBC w/Diff/Platelet   Comprehensive metabolic panel    Other Visit Diagnoses    Encounter for annual physical exam    -  Primary  Return in about 3 months (around 12/10/2018) for weight loss f/u.   The entirety of the information documented in the History of Present Illness, Review of Systems and Physical Exam were personally obtained by me. Portions of this information were initially documented by Tiburcio Pea, CMA and reviewed by me for thoroughness and accuracy.    Bacigalupo, Dionne Bucy, MD MPH [facility]

## 2018-09-09 NOTE — Patient Instructions (Addendum)
Ms. Rebecca Patterson , Thank you for taking time to come for your Medicare Wellness Visit. I appreciate your ongoing commitment to your health goals. Please review the following plan we discussed and let me know if I can assist you in the future.   Screening recommendations/referrals: Colonoscopy: No longer required.  Mammogram: No longer required.  Bone Density: Up to date, due 02/2019 Recommended yearly ophthalmology/optometry visit for glaucoma screening and checkup Recommended yearly dental visit for hygiene and checkup  Vaccinations: Influenza vaccine: Up to date Pneumococcal vaccine: Completed series Tdap vaccine: Pt declines today.  Shingles vaccine: Pt declines today.     Advanced directives: Currently on file.  Conditions/risks identified: Continue to increase water intake to 6-8 8 oz glasses a day.   Next appointment: 11:00 AM today with Dr Brita Romp. Declined sche   Preventive Care 45 Years and Older, Female Preventive care refers to lifestyle choices and visits with your health care provider that can promote health and wellness. What does preventive care include?  A yearly physical exam. This is also called an annual well check.  Dental exams once or twice a year.  Routine eye exams. Ask your health care provider how often you should have your eyes checked.  Personal lifestyle choices, including:  Daily care of your teeth and gums.  Regular physical activity.  Eating a healthy diet.  Avoiding tobacco and drug use.  Limiting alcohol use.  Practicing safe sex.  Taking low-dose aspirin every day.  Taking vitamin and mineral supplements as recommended by your health care provider. What happens during an annual well check? The services and screenings done by your health care provider during your annual well check will depend on your age, overall health, lifestyle risk factors, and family history of disease. Counseling  Your health care provider may ask you  questions about your:  Alcohol use.  Tobacco use.  Drug use.  Emotional well-being.  Home and relationship well-being.  Sexual activity.  Eating habits.  History of falls.  Memory and ability to understand (cognition).  Work and work Statistician.  Reproductive health. Screening  You may have the following tests or measurements:  Height, weight, and BMI.  Blood pressure.  Lipid and cholesterol levels. These may be checked every 5 years, or more frequently if you are over 42 years old.  Skin check.  Lung cancer screening. You may have this screening every year starting at age 49 if you have a 30-pack-year history of smoking and currently smoke or have quit within the past 15 years.  Fecal occult blood test (FOBT) of the stool. You may have this test every year starting at age 66.  Flexible sigmoidoscopy or colonoscopy. You may have a sigmoidoscopy every 5 years or a colonoscopy every 10 years starting at age 47.  Hepatitis C blood test.  Hepatitis B blood test.  Sexually transmitted disease (STD) testing.  Diabetes screening. This is done by checking your blood sugar (glucose) after you have not eaten for a while (fasting). You may have this done every 1-3 years.  Bone density scan. This is done to screen for osteoporosis. You may have this done starting at age 68.  Mammogram. This may be done every 1-2 years. Talk to your health care provider about how often you should have regular mammograms. Talk with your health care provider about your test results, treatment options, and if necessary, the need for more tests. Vaccines  Your health care provider may recommend certain vaccines, such as:  Influenza vaccine.  This is recommended every year.  Tetanus, diphtheria, and acellular pertussis (Tdap, Td) vaccine. You may need a Td booster every 10 years.  Zoster vaccine. You may need this after age 50.  Pneumococcal 13-valent conjugate (PCV13) vaccine. One dose is  recommended after age 79.  Pneumococcal polysaccharide (PPSV23) vaccine. One dose is recommended after age 75. Talk to your health care provider about which screenings and vaccines you need and how often you need them. This information is not intended to replace advice given to you by your health care provider. Make sure you discuss any questions you have with your health care provider. Document Released: 02/18/2015 Document Revised: 10/12/2015 Document Reviewed: 11/23/2014 Elsevier Interactive Patient Education  2017 [place] Prevention in the Home Falls can cause injuries. They can happen to people of all ages. There are many things you can do to make your home safe and to help prevent falls. What can I do on the outside of my home?  Regularly fix the edges of walkways and driveways and fix any cracks.  Remove anything that might make you trip as you walk through a door, such as a raised step or threshold.  Trim any bushes or trees on the path to your home.  Use bright outdoor lighting.  Clear any walking paths of anything that might make someone trip, such as rocks or tools.  Regularly check to see if handrails are loose or broken. Make sure that both sides of any steps have handrails.  Any raised decks and porches should have guardrails on the edges.  Have any leaves, snow, or ice cleared regularly.  Use sand or salt on walking paths during winter.  Clean up any spills in your garage right away. This includes oil or grease spills. What can I do in the bathroom?  Use night lights.  Install grab bars by the toilet and in the tub and shower. Do not use towel bars as grab bars.  Use non-skid mats or decals in the tub or shower.  If you need to sit down in the shower, use a plastic, non-slip stool.  Keep the floor dry. Clean up any water that spills on the floor as soon as it happens.  Remove soap buildup in the tub or shower regularly.  Attach bath mats  securely with double-sided non-slip rug tape.  Do not have throw rugs and other things on the floor that can make you trip. What can I do in the bedroom?  Use night lights.  Make sure that you have a light by your bed that is easy to reach.  Do not use any sheets or blankets that are too big for your bed. They should not hang down onto the floor.  Have a firm chair that has side arms. You can use this for support while you get dressed.  Do not have throw rugs and other things on the floor that can make you trip. What can I do in the kitchen?  Clean up any spills right away.  Avoid walking on wet floors.  Keep items that you use a lot in easy-to-reach places.  If you need to reach something above you, use a strong step stool that has a grab bar.  Keep electrical cords out of the way.  Do not use floor polish or wax that makes floors slippery. If you must use wax, use non-skid floor wax.  Do not have throw rugs and other things on the floor that can make  you trip. What can I do with my stairs?  Do not leave any items on the stairs.  Make sure that there are handrails on both sides of the stairs and use them. Fix handrails that are broken or loose. Make sure that handrails are as long as the stairways.  Check any carpeting to make sure that it is firmly attached to the stairs. Fix any carpet that is loose or worn.  Avoid having throw rugs at the top or bottom of the stairs. If you do have throw rugs, attach them to the floor with carpet tape.  Make sure that you have a light switch at the top of the stairs and the bottom of the stairs. If you do not have them, ask someone to add them for you. What else can I do to help prevent falls?  Wear shoes that:  Do not have high heels.  Have rubber bottoms.  Are comfortable and fit you well.  Are closed at the toe. Do not wear sandals.  If you use a stepladder:  Make sure that it is fully opened. Do not climb a closed  stepladder.  Make sure that both sides of the stepladder are locked into place.  Ask someone to hold it for you, if possible.  Clearly mark and make sure that you can see:  Any grab bars or handrails.  First and last steps.  Where the edge of each step is.  Use tools that help you move around (mobility aids) if they are needed. These include:  Canes.  Walkers.  Scooters.  Crutches.  Turn on the lights when you go into a dark area. Replace any light bulbs as soon as they burn out.  Set up your furniture so you have a clear path. Avoid moving your furniture around.  If any of your floors are uneven, fix them.  If there are any pets around you, be aware of where they are.  Review your medicines with your doctor. Some medicines can make you feel dizzy. This can increase your chance of falling. Ask your doctor what other things that you can do to help prevent falls. This information is not intended to replace advice given to you by your health care provider. Make sure you discuss any questions you have with your health care provider. Document Released: 11/18/2008 Document Revised: 06/30/2015 Document Reviewed: 02/26/2014 Elsevier Interactive Patient Education  2017 [employer].

## 2018-09-09 NOTE — Assessment & Plan Note (Signed)
Previously well controlled Could contribute to recent weight loss Recheck TSH Continue synthroid

## 2018-09-09 NOTE — Progress Notes (Signed)
Subjective:   Rebecca Patterson is a 84 y.o. female who presents for Medicare Annual (Subsequent) preventive examination.  Review of Systems:  N/A  Cardiac Risk Factors include: advanced age (>37men, >52 women);diabetes mellitus;dyslipidemia     Objective:     Vitals: BP 122/66 (BP Location: Right Arm)   Pulse 95   Temp 97.9 F (36.6 C) (Oral)   Ht 5' (1.524 m)   Wt 124 lb 3.2 oz (56.3 kg)   BMI 24.26 kg/m   Body mass index is 24.26 kg/m.  Advanced Directives 09/09/2018 06/16/2018 04/23/2018 09/05/2017 06/14/2017 08/29/2016 06/15/2016  Does Patient Have a Medical Advance Directive? Yes No Yes Yes Yes Yes Yes  Type of Paramedic of [place];Living will - [place];Living will [place];Living will Living will;Healthcare Power of [place];Living will [place];Living will  Does patient want to make changes to medical advance directive? - - - - No - Patient declined - No - Patient declined  Copy of [place] in Chart? Yes - validated most recent copy scanned in chart (See row information) - No - copy requested No - copy requested No - copy requested No - copy requested No - copy requested  Would patient like information on creating a medical advance directive? - No - Patient declined - - - - -    Tobacco Social History   Tobacco Use  Smoking Status Never Smoker  Smokeless Tobacco Never Used     Counseling given: Not Answered   Clinical Intake:  Pre-visit preparation completed: Yes  Pain : No/denies pain Pain Score: 0-No pain     Nutritional Status: BMI of 19-24  Normal Nutritional Risks: None Diabetes: Yes  How often do you need to have someone help you when you read instructions, pamphlets, or other written materials from your doctor or pharmacy?: 1 - Never   Diabetes:  Is the patient diabetic?  Yes type 2 If diabetic, was a CBG obtained  today?  No  Did the patient bring in their glucometer from home?  No  How often do you monitor your CBG's? Does not check BS.   Financial Strains and Diabetes Management:  Are you having any financial strains with the device, your supplies or your medication? No .  Does the patient want to be seen by Chronic Care Management for management of their diabetes?  No  Would the patient like to be referred to a Nutritionist or for Diabetic Management?  No   Diabetic Exams:  Diabetic Eye Exam: Completed 11/11/17. Repeat yearly.   Diabetic Foot Exam: Completed 12/09/17. Repeat yearly.   Interpreter Needed?: No  Information entered by :: [facility], LPN  Past Medical History:  Diagnosis Date  . Allergy   . Breast cancer ([place]) 2013   Rt  . Cancer [facility]) 2013   Right Breast- radiation and Tamoxifen  . Complication of anesthesia   . Corn   . Family history of adverse reaction to anesthesia    sister - slow to wake  . Fibrocystic breast   . GERD (gastroesophageal reflux disease)   . Hyperlipidemia   . Osteoporosis   . Osteoporosis   . Personal history of radiation therapy   . PONV (postoperative nausea and vomiting)   . Thyroid disease   . Vertigo    long ago  . Wears dentures    full upper and lower  . Wears hearing aid  bilateral   Past Surgical History:  Procedure Laterality Date  . ABDOMINAL HYSTERECTOMY    . APPENDECTOMY    . BREAST BIOPSY Right 2013   Stereo- Positive  . BREAST CYST EXCISION Right n/a   cyst removed  . BREAST LUMPECTOMY Right    2013  . BUNIONECTOMY Bilateral 2004  . 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 CATARACT EXTRACTION W/PHACO Right 11/10/2014   Procedure: CATARACT EXTRACTION PHACO AND INTRAOCULAR LENS PLACEMENT (IOC);  Surgeon: Leandrew Koyanagi, MD;  Location: [facility];  Service: Ophthalmology;  Laterality: Right;  . CHOLECYSTECTOMY    . ESOPHAGOGASTRODUODENOSCOPY (EGD) WITH PROPOFOL N/A 04/23/2018   Procedure:  ESOPHAGOGASTRODUODENOSCOPY (EGD) WITH PROPOFOL;  Surgeon: Virgel Manifold, MD;  Location: [facility] ENDOSCOPY;  Service: Endoscopy;  Laterality: N/A;  pt prefers later in am  . KNEE SURGERY Right 2005  . Mastoid surgery Right   . SKIN CANCER EXCISION  03/17/2014   right side of face Dr. Aubery Lapping  . THYROID SURGERY N/A 1980   Goiter   Family History  Problem Relation Age of Onset  . Stroke Sister   . Breast cancer Sister 78  . Breast cancer Other   . Colon cancer Neg Hx    Social History   Socioeconomic History  . Marital status: Married    Spouse name: Juanda Crumble  . Number of children: 2  . Years of education: Not on file  . Highest education level: 12th grade  Occupational History  . Occupation: retired  Scientific laboratory technician  . Financial resource strain: Not hard at all  . Food insecurity    Worry: Never true    Inability: Never true  . Transportation needs    Medical: No    Non-medical: No  Tobacco Use  . Smoking status: Never Smoker  . Smokeless tobacco: Never Used  Substance and Sexual Activity  . Alcohol use: No  . Drug use: No  . Sexual activity: Not on file  Lifestyle  . Physical activity    Days per week: 0 days    Minutes per session: 0 min  . Stress: Only a little  Relationships  . Social Herbalist on phone: Patient refused    Gets together: Patient refused    Attends religious service: Patient refused    Active member of club or organization: Patient refused    Attends meetings of clubs or organizations: Patient refused    Relationship status: Patient refused  Other Topics Concern  . Not on file  Social History Narrative  . Not on file    Outpatient Encounter Medications as of 09/09/2018  Medication Sig  . aspirin EC 81 MG tablet Take 81 mg by mouth daily.  . calcium carbonate (OS-CAL) 600 MG TABS tablet Take 600 mg by mouth daily with breakfast.   . carbidopa-levodopa (SINEMET IR) 25-100 MG tablet Take 1.5 tablets by mouth 3 (three) times  daily.   Marland Kitchen docusate sodium (COLACE) 100 MG capsule Take by mouth daily.  . famotidine (PEPCID) 20 MG tablet Take 20 mg by mouth as needed.   Marland Kitchen ketoconazole (NIZORAL) 2 % cream Apply 1 application topically daily as needed.   Marland Kitchen levothyroxine (SYNTHROID, LEVOTHROID) 50 MCG tablet TAKE 1 TABLET BY MOUTH EVERY DAY  . metFORMIN (GLUCOPHAGE) 500 MG tablet TAKE 1 TABLET (500 MG TOTAL) BY MOUTH 2 (TWO) TIMES DAILY WITH A MEAL.  . Multiple Vitamins-Minerals (PRESERVISION AREDS 2 PO) Take by mouth 2 (two) times daily.   Marland Kitchen  nystatin cream (MYCOSTATIN) APPLY TO AFFECTED AREA TWICE A DAY AS NEEDED  . Omega-3 Fatty Acids (FISH OIL DOUBLE STRENGTH) 1200 MG CAPS Take 1 capsule by mouth daily.   . polyethylene glycol (MIRALAX / GLYCOLAX) packet Take 17 g by mouth daily as needed.   . pravastatin (PRAVACHOL) 40 MG tablet TAKE 1 TABLET BY MOUTH EVERY DAY  . sertraline (ZOLOFT) 25 MG tablet Take 25 mg by mouth daily.  Marland Kitchen triamcinolone cream (KENALOG) 0.1 % APPLY TO AFFECTED AREA 3 TIMES A DAY FOR INSECT BITES (Patient taking differently: Apply 1 application topically as needed. )   No facility-administered encounter medications on file as of 09/09/2018.     Activities of Daily Living In your present state of health, do you have any difficulty performing the following activities: 09/09/2018  Hearing? Y  Comment Wears bilateral hearing aids.  Vision? N  Comment Wears eye glasses daily.  Difficulty concentrating or making decisions? N  Walking or climbing stairs? N  Dressing or bathing? N  Doing errands, shopping? N  Preparing Food and eating ? N  Using the Toilet? N  In the past six months, have you accidently leaked urine? N  Do you have problems with loss of bowel control? N  Managing your Medications? N  Managing your Finances? N  Housekeeping or managing your Housekeeping? N  Some recent data might be hidden    Patient Care Team: Virginia Crews, MD as PCP - General (Family Medicine) Leandrew Koyanagi, MD as Referring Physician (Ophthalmology) Cammie Sickle, MD as Consulting Physician (Internal Medicine) Isaias Cowman, MD as Consulting Physician (Cardiology) Flora Lipps, MD as Consulting Physician (Pulmonary Disease)    Assessment:   This is a routine wellness examination for [facility].  Exercise Activities and Dietary recommendations Current Exercise Habits: The patient does not participate in regular exercise at present, Exercise limited by: Other - see comments(Parkinsons)  Goals    . Exercise 150 minutes per week (moderate activity)    . water intake     Continue drinking 6-8 glasses of water a day.        Fall Risk: Fall Risk  09/09/2018 09/05/2017 08/29/2016 08/18/2015 08/18/2015  Falls in the past year? 0 No No No No    FALL RISK PREVENTION PERTAINING TO THE HOME:  Any stairs in or around the home? Yes  If so, are there any without handrails? Yes at the back door.  Home free of loose throw rugs in walkways, pet beds, electrical cords, etc? Yes  Adequate lighting in your home to reduce risk of falls? Yes   ASSISTIVE DEVICES UTILIZED TO PREVENT FALLS:  Life alert? No Use of a cane, walker or w/c? No  Grab bars in the bathroom? Yes  Shower chair or bench in shower? No  Elevated toilet seat or a handicapped toilet? No   TIMED UP AND GO:  Was the test performed? No .    Depression Screen PHQ 2/9 Scores 09/09/2018 09/05/2017 09/05/2017 08/29/2016  PHQ - 2 Score 0 0 0 0  PHQ- 9 Score - 0 - 0     Cognitive Function: Declined today.      6CIT Screen 08/29/2016  What Year? 0 points  What month? 0 points  What time? 0 points  Count back from 20 0 points  Months in reverse 2 points  Repeat phrase 0 points  Total Score 2    Immunization History  Administered Date(s) Administered  . Influenza, High Dose Seasonal PF 11/11/2015,  10/10/2016, 12/09/2017  . Influenza,inj,Quad PF,6+ Mos 02/18/2015  . Influenza-Unspecified 10/06/2013  . Pneumococcal  Conjugate-13 10/21/2013  . Pneumococcal Polysaccharide-23 01/20/2010  . Td 09/03/2005    Qualifies for Shingles Vaccine? Yes . Due for Shingrix. Education has been provided regarding the importance of this vaccine. Pt has been advised to call insurance company to determine out of pocket expense. Advised may also receive vaccine at local pharmacy or Health Dept. Verbalized acceptance and understanding.  Tdap: Although this vaccine is not a covered service during a Wellness Exam, does the patient still wish to receive this vaccine today? No.  Education has been provided regarding the importance of this vaccine. Advised may receive this vaccine at local pharmacy or Health Dept. Aware to provide a copy of the vaccination record if obtained from local pharmacy or Health Dept. Verbalized acceptance and understanding.  Flu Vaccine: Due fall 2020.  Pneumococcal Vaccine: Completed series  Screening Tests Health Maintenance  Topic Date Due  . INFLUENZA VACCINE  09/06/2018  . TETANUS/TDAP  02/05/2026 (Originally 09/04/2015)  . HEMOGLOBIN A1C  09/17/2018  . OPHTHALMOLOGY EXAM  11/12/2018  . FOOT EXAM  12/10/2018  . URINE MICROALBUMIN  12/10/2018  . DEXA SCAN  02/19/2019  . PNA vac Low Risk Adult  Completed    Cancer Screenings:  Colorectal Screening: No longer required.   Mammogram: No longer required.   Bone Density: Completed 02/18/17. Results reflect OSTEOPOROSIS. Repeat every 2 years.   Lung Cancer Screening: (Low Dose CT Chest recommended if Age 14-80 years, 30 pack-year currently smoking OR have quit w/in 15years.) does not qualify.   Additional Screening:  Dental Screening: Recommended annual dental exams for proper oral hygiene   Community Resource Referral:  CRR required this visit?  No       Plan:  I have personally reviewed and addressed the Medicare Annual Wellness questionnaire and have noted the following in the patient's chart:  A. Medical and social history B. Use  of alcohol, tobacco or illicit drugs  C. Current medications and supplements D. Functional ability and status E.  Nutritional status F.  Physical activity G. Advance directives H. List of other physicians I.  Hospitalizations, surgeries, and ER visits in previous 12 months J.  [place] such as hearing and vision if needed, cognitive and depression L. Referrals and appointments   In addition, I have reviewed and discussed with patient certain preventive protocols, quality metrics, and best practice recommendations. A written personalized care plan for preventive services as well as general preventive health recommendations were provided to patient.   Glendora Score, LPN  10/12/2834 Nurse Health Advisor   Nurse Notes: None.

## 2018-09-09 NOTE — Assessment & Plan Note (Signed)
Prviously well controleld Repeat A1c - if still controlled will stop metformin UTD on vaccines, eyeexam, foot exam On statin Discussed diet and exercise F/u in 6 months

## 2018-09-10 ENCOUNTER — Telehealth: Payer: Self-pay

## 2018-09-10 ENCOUNTER — Ambulatory Visit: Payer: Medicare Other | Attending: Internal Medicine

## 2018-09-10 DIAGNOSIS — R0602 Shortness of breath: Secondary | ICD-10-CM | POA: Insufficient documentation

## 2018-09-10 LAB — CBC WITH DIFFERENTIAL/PLATELET
Basophils Absolute: 0 10*3/uL (ref 0.0–0.2)
Basos: 0 %
EOS (ABSOLUTE): 0 10*3/uL (ref 0.0–0.4)
Eos: 1 %
Hematocrit: 36.4 % (ref 34.0–46.6)
Hemoglobin: 12.1 g/dL (ref 11.1–15.9)
Immature Grans (Abs): 0 10*3/uL (ref 0.0–0.1)
Immature Granulocytes: 0 %
Lymphocytes Absolute: 1.7 10*3/uL (ref 0.7–3.1)
Lymphs: 37 %
MCH: 30.6 pg (ref 26.6–33.0)
MCHC: 33.2 g/dL (ref 31.5–35.7)
MCV: 92 fL (ref 79–97)
Monocytes Absolute: 0.5 10*3/uL (ref 0.1–0.9)
Monocytes: 10 %
Neutrophils Absolute: 2.4 10*3/uL (ref 1.4–7.0)
Neutrophils: 52 %
Platelets: 231 10*3/uL (ref 150–450)
RBC: 3.95 x10E6/uL (ref 3.77–5.28)
RDW: 13 % (ref 11.7–15.4)
WBC: 4.6 10*3/uL (ref 3.4–10.8)

## 2018-09-10 LAB — COMPREHENSIVE METABOLIC PANEL
ALT: 5 IU/L (ref 0–32)
AST: 10 IU/L (ref 0–40)
Albumin/Globulin Ratio: 2.5 — ABNORMAL HIGH (ref 1.2–2.2)
Albumin: 4.7 g/dL — ABNORMAL HIGH (ref 3.6–4.6)
Alkaline Phosphatase: 49 IU/L (ref 39–117)
BUN/Creatinine Ratio: 22 (ref 12–28)
BUN: 14 mg/dL (ref 8–27)
Bilirubin Total: 0.5 mg/dL (ref 0.0–1.2)
CO2: 26 mmol/L (ref 20–29)
Calcium: 9.4 mg/dL (ref 8.7–10.3)
Chloride: 100 mmol/L (ref 96–106)
Creatinine, Ser: 0.64 mg/dL (ref 0.57–1.00)
GFR calc Af Amer: 97 mL/min/{1.73_m2} (ref >59)
GFR calc non Af Amer: 84 mL/min/{1.73_m2} (ref >59)
Globulin, Total: 1.9 g/dL (ref 1.5–4.5)
Glucose: 118 mg/dL — ABNORMAL HIGH (ref 65–99)
Potassium: 4.5 mmol/L (ref 3.5–5.2)
Sodium: 142 mmol/L (ref 134–144)
Total Protein: 6.6 g/dL (ref 6.0–8.5)

## 2018-09-10 LAB — HEMOGLOBIN A1C
Est. average glucose Bld gHb Est-mCnc: 128 mg/dL
Hgb A1c MFr Bld: 6.1 % — ABNORMAL HIGH (ref 4.8–5.6)

## 2018-09-10 LAB — LIPID PANEL
Chol/HDL Ratio: 2.4 ratio (ref 0.0–4.4)
Cholesterol, Total: 175 mg/dL (ref 100–199)
HDL: 72 mg/dL (ref >39)
LDL Calculated: 80 mg/dL (ref 0–99)
Triglycerides: 115 mg/dL (ref 0–149)
VLDL Cholesterol Cal: 23 mg/dL (ref 5–40)

## 2018-09-10 LAB — TSH: TSH: 0.614 u[IU]/mL (ref 0.450–4.500)

## 2018-09-10 MED ORDER — ALBUTEROL SULFATE (2.5 MG/3ML) 0.083% IN NEBU
2.5000 mg | INHALATION_SOLUTION | Freq: Once | RESPIRATORY_TRACT | Status: AC
  Administered 2018-09-10: 2.5 mg via RESPIRATORY_TRACT
  Filled 2018-09-10: qty 3

## 2018-09-10 NOTE — Telephone Encounter (Signed)
-----   Message from Virginia Crews, MD sent at 09/10/2018  8:23 AM EDT ----- Normal labs. A1c remains low. OK to d/c Metformin (please inform patient and remove from med list)

## 2018-09-10 NOTE — Telephone Encounter (Signed)
Patient advised.

## 2018-09-16 ENCOUNTER — Telehealth: Payer: Self-pay | Admitting: Internal Medicine

## 2018-09-16 NOTE — Telephone Encounter (Signed)
Called patient for COVID-19 pre-screening for in office visit.  Have you recently traveled any where out of the local area in the last 2 weeks? No  Have you been in close contact with a person diagnosed with COVID-19 or someone awaiting results within the last 2 weeks? No  Do you currently have any of the following symptoms? If so, when did they start? Cough     Diarrhea   Joint Pain Fever      Muscle Pain   Red eyes Shortness of breath   Abdominal pain  Vomiting Loss of smell    Rash    Sore Throat Headache    Weakness   Bruising or bleeding  Been around husband who has Pneumonia and has not been tested.    Okay to proceed with visit 09/17/2018

## 2018-09-16 NOTE — Telephone Encounter (Signed)
DK please advise if okay for pt to keep in office appointment.  Pt's husband is being treated for PNA. He has cough and sob, and has not been tested for covid.

## 2018-09-17 ENCOUNTER — Encounter: Payer: Self-pay | Admitting: Internal Medicine

## 2018-09-17 ENCOUNTER — Other Ambulatory Visit: Payer: Self-pay

## 2018-09-17 ENCOUNTER — Ambulatory Visit (INDEPENDENT_AMBULATORY_CARE_PROVIDER_SITE_OTHER): Payer: Medicare Other | Admitting: Internal Medicine

## 2018-09-17 DIAGNOSIS — R0602 Shortness of breath: Secondary | ICD-10-CM | POA: Diagnosis not present

## 2018-09-17 DIAGNOSIS — J4520 Mild intermittent asthma, uncomplicated: Secondary | ICD-10-CM | POA: Diagnosis not present

## 2018-09-17 MED ORDER — ALBUTEROL SULFATE HFA 108 (90 BASE) MCG/ACT IN AERS: 2.0000 | INHALATION_SPRAY | RESPIRATORY_TRACT | 6 refills | Status: AC | PRN

## 2018-09-17 NOTE — Patient Instructions (Signed)
Start ALBUTEROL 2 puffs every 4 hrs as needed for Shortness of breath

## 2018-09-17 NOTE — Telephone Encounter (Signed)
Per DK- due to sick contact. Pt will need to be phone visit.  Appointment has been changed to phone visit. Pt is aware.

## 2018-09-17 NOTE — Progress Notes (Signed)
Name: Rebecca Patterson MRN: [id number] DOB: 10/12/1934     I connected with the patient by telephone enabled telemedicine visit and verified that I am speaking with the correct person using two identifiers.    I discussed the limitations, risks, security and privacy concerns of performing an evaluation and management service by telemedicine and the availability of in-person appointments. I also discussed with the patient that there may be a patient responsible charge related to this service. The patient expressed understanding and agreed to proceed.  PATIENT AGREES AND CONFIRMS -YES   Other persons participating in the visit and their role in the encounter: Patient, nursing   Patient's location: Home Provider's location: Clinic   I discussed the limitations, risks, security and privacy concerns of performing an evaluation and management service by telephone and the availability of in person appointments. I also discussed with the patient that there may be a patient responsible charge related to this service. The patient expressed understanding and agreed to proceed.  This visit type was conducted due to national recommendations for restrictions regarding the COVID-19 Pandemic (e.g. social distancing).  This format is felt to be most appropriate for this patient at this time.  All issues noted in this document were discussed and addressed.       CONSULTATION DATE: 09/17/2018 REFERRING MD : Geri [place]in[place]  CHIEF COMPLAINT: Follow-up shortness of breath  HISTORY OF PRESENT ILLNESS: CT chest reviewed with patient and family over the phone There is no obvious evidence of interstitial lung disease Very tiny pulmonary nodules are insignificant  Pulmonary function testing shows no evidence of obstructive lung disease however has some mild restrictive lung disease This could be related to her kyphosis and poor inspiratory effort  At this time I have explained to the family that her shortness of  breath is not related to any significant type of lung disease however she may have intermittent reactive airways disease  Patient has no signs of infection at this time Patient shortness of breath is stable and has not progressed No evidence of cough No fevers no chills No wheezing  Patient is non-smoker  Has secondhand smoke exposure  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   Patient does have a history of irregular heartbeat  Follow-up cardiology pending   Diagnosis of breast cancer status post therapy 6 years ago No acute respiratory distress      PAST MEDICAL HISTORY :   has a past medical history of Allergy, Breast cancer ([place]) (2013), Cancer ([place]) (3500), Complication of anesthesia, Corn, Family history of adverse reaction to anesthesia, Fibrocystic breast, GERD (gastroesophageal reflux disease), Hyperlipidemia, Osteoporosis, Osteoporosis, Personal history of radiation therapy, PONV (postoperative nausea and vomiting), Thyroid disease, Vertigo, Wears dentures, and Wears hearing aid.  has a past surgical history that includes Abdominal hysterectomy; Cholecystectomy; Carotid body tumor excision (Right, 10/19/2005); Knee surgery (Right, 2005); Bunionectomy (Bilateral, 2004); Thyroid surgery (N/A, 1980); Mastoid surgery (Right); Skin cancer excision (03/17/2014); Cataract extraction w/PHACO (Right, 11/10/2014); Breast lumpectomy (Right); Appendectomy; Esophagogastroduodenoscopy (egd) with propofol (N/A, 04/23/2018); Breast biopsy (Right, 2013); and Breast cyst excision (Right, n/a). Prior to Admission medications   Medication Sig Start Date End Date Taking? Authorizing Provider  aspirin EC 81 MG tablet Take 81 mg by mouth daily.    [provider]  calcium carbonate (OS-CAL) 600 MG TABS tablet Take 600 mg by mouth daily with breakfast.  01/20/10   [provider]  carbidopa-levodopa (SINEMET IR) 25-100 MG tablet TAKE 1 TABLET BY MOUTH THREE  TIMES A DAY 12/02/17    [provider]  docusate sodium (COLACE) 100 MG capsule Take by mouth daily.    [provider]  famotidine (PEPCID) 20 MG tablet Take 20 mg by mouth as needed.     [provider]  ketoconazole (NIZORAL) 2 % cream Apply 1 application topically daily as needed.  08/07/15   [provider]  levothyroxine (SYNTHROID, LEVOTHROID) 50 MCG tablet TAKE 1 TABLET BY MOUTH EVERY DAY 03/03/18   Bacigalupo, Dionne Bucy, MD  metFORMIN (GLUCOPHAGE) 500 MG tablet TAKE 1 TABLET (500 MG TOTAL) BY MOUTH 2 (TWO) TIMES DAILY WITH A MEAL. 03/03/18   Virginia Crews, MD  Multiple Vitamins-Minerals (PRESERVISION AREDS 2 PO) Take by mouth.    [provider]  nystatin cream (MYCOSTATIN) APPLY TO AFFECTED AREA TWICE A DAY AS NEEDED 08/04/15   Margarita Rana, MD  Omega-3 Fatty Acids (FISH OIL DOUBLE STRENGTH) 1200 MG CAPS Take 1 capsule by mouth daily.  01/20/10   [provider]  polyethylene glycol (MIRALAX / GLYCOLAX) packet Take 17 g by mouth daily.    [provider]  pravastatin (PRAVACHOL) 40 MG tablet TAKE 1 TABLET BY MOUTH EVERY DAY 06/02/18   Virginia Crews, MD  triamcinolone cream (KENALOG) 0.1 % APPLY TO AFFECTED AREA 3 TIMES A DAY FOR INSECT BITES Patient not taking: Reported on 06/16/2018 12/06/16   Birdie Sons, MD   Allergies  Allergen Reactions  . Actonel [Risedronate]   . Boniva [Ibandronic Acid]     shakes  . Fosamax [Alendronate]     Patient reports medication caused her to severe mood changes.    FAMILY HISTORY:  family history includes Breast cancer in an other family member; Breast cancer (age of onset: 74) in her sister; Stroke in her sister. SOCIAL HISTORY:  reports that she has never smoked. She has never used smokeless tobacco. She reports that she does not drink alcohol or use drugs.     Review of Systems:  Gen:  Denies  fever, sweats, chills weight loss  HEENT: Denies blurred vision, double vision, ear pain, eye  pain, hearing loss, nose bleeds, sore throat Cardiac:  No dizziness, chest pain or heaviness, chest tightness,edema, No JVD Resp:   No cough, -sputum production, -shortness of breath,-wheezing, -hemoptysis,  Gi: Denies swallowing difficulty, stomach pain, nausea or vomiting, diarrhea, constipation, bowel incontinence Gu:  Denies bladder incontinence, burning urine Ext:   Denies Joint pain, stiffness or swelling Skin: Denies  skin rash, easy bruising or bleeding or hives Endoc:  Denies polyuria, polydipsia , polyphagia or weight change Psych:   Denies depression, insomnia or hallucinations  Other:  All other systems negative    The chest reviewed with patient over the phone No significant interstitial lung disease No pneumonia No obvious masses    ASSESSMENT AND PLAN SYNOPSIS  84 year old pleasant white female follow-up today for progressive shortness of breath and dyspnea on exertion most likely related to advanced age in the setting of decreased physiological pulmonary function by aging process in the setting of kyphosis with restrictive lung disease with a possible underlying intermittent reactive airways disease  At this time I would recommend albuterol 2 puffs every 4 hours as needed No further testing needed at this time Follow-up with cardiology    COVID-19 EDUCATION: The signs and symptoms of COVID-19 were discussed with the patient and how to seek care for testing.  The importance of social distancing was discussed today. Hand Washing Techniques and  avoid touching face was advised.  MEDICATION ADJUSTMENTS/LABS AND TESTS ORDERED: Albuterol 2 puffs every 4 hours as needed  CURRENT MEDICATIONS REVIEWED AT LENGTH WITH PATIENT TODAY   Patient satisfied with Plan of action and management. All questions answered  Follow up in 3 months  Total time spent in reviewing of chart and results 30 minutes   Iran Kievit Patricia Pesa, M.D.  [facility]   Medical Director [place] Director [facility] Cardio-Pulmonary Department

## 2018-09-25 ENCOUNTER — Ambulatory Visit: Payer: Medicare Other

## 2018-09-25 ENCOUNTER — Encounter: Payer: Medicare Other | Admitting: Family Medicine

## 2018-10-08 ENCOUNTER — Ambulatory Visit
Admission: RE | Admit: 2018-10-08 | Discharge: 2018-10-08 | Disposition: A | Discharge status: Home / Self Care | Payer: Medicare Other | Source: Ambulatory Visit | Attending: Neurology | Admitting: Neurology

## 2018-10-08 ENCOUNTER — Other Ambulatory Visit: Payer: Self-pay

## 2018-10-08 DIAGNOSIS — T17300A Unspecified foreign body in larynx causing asphyxiation, initial encounter: Secondary | ICD-10-CM | POA: Diagnosis not present

## 2018-10-08 DIAGNOSIS — R1312 Dysphagia, oropharyngeal phase: Secondary | ICD-10-CM | POA: Diagnosis not present

## 2018-10-08 DIAGNOSIS — I69891 Dysphagia following other cerebrovascular disease: Secondary | ICD-10-CM | POA: Diagnosis not present

## 2018-10-08 NOTE — Therapy (Signed)
[place], [place], [address] Phone: [phone]   Fax:     Modified Barium Swallow  Patient Details  Name: Rebecca Patterson MRN: [id number] Date of Birth: October 23, 1934 No data recorded  Encounter Date: 10/08/2018  End of Session - 10/08/18 1326    Visit Number  1    Number of Visits  1    Date for SLP Re-Evaluation  10/08/18    SLP Start Time  67    SLP Stop Time   1315    SLP Time Calculation (min)  45 min    Activity Tolerance  Patient tolerated treatment well       Past Medical History:  Diagnosis Date  . Allergy   . Breast cancer ([place]) 2013   Rt  . Cancer [facility]) 2013   Right Breast- radiation and Tamoxifen  . Complication of anesthesia   . Corn   . Family history of adverse reaction to anesthesia    sister - slow to wake  . Fibrocystic breast   . GERD (gastroesophageal reflux disease)   . Hyperlipidemia   . Osteoporosis   . Osteoporosis   . Personal history of radiation therapy   . PONV (postoperative nausea and vomiting)   . Thyroid disease   . Vertigo    long ago  . Wears dentures    full upper and lower  . Wears hearing aid    bilateral    Past Surgical History:  Procedure Laterality Date  . ABDOMINAL HYSTERECTOMY    . APPENDECTOMY    . BREAST BIOPSY Right 2013   Stereo- Positive  . BREAST CYST EXCISION Right n/a   cyst removed  . BREAST LUMPECTOMY Right    2013  . BUNIONECTOMY Bilateral 2004  . 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 CATARACT EXTRACTION W/PHACO Right 11/10/2014   Procedure: CATARACT EXTRACTION PHACO AND INTRAOCULAR LENS PLACEMENT (IOC);  Surgeon: Leandrew Koyanagi, MD;  Location: [facility];  Service: Ophthalmology;  Laterality: Right;  . CHOLECYSTECTOMY    . ESOPHAGOGASTRODUODENOSCOPY (EGD) WITH PROPOFOL N/A 04/23/2018   Procedure: ESOPHAGOGASTRODUODENOSCOPY (EGD) WITH PROPOFOL;  Surgeon: Virgel Manifold, MD;  Location: [facility]  ENDOSCOPY;  Service: Endoscopy;  Laterality: N/A;  pt prefers later in am  . KNEE SURGERY Right 2005  . Mastoid surgery Right   . SKIN CANCER EXCISION  03/17/2014   right side of face Dr. Aubery Lapping  . THYROID SURGERY N/A 1980   Goiter    There were no vitals filed for this visit.    Subjective: Patient behavior: (alertness, ability to follow instructions, etc.): the patient is alert and able to express her swallowing complaints.  She has a strong voice.  Chief complaint: The patient told SLP that she has trouble with pills and meats   Objective:  Radiological Procedure: A videoflouroscopic evaluation of oral-preparatory, reflex initiation, and pharyngeal phases of the swallow was performed; as well as a screening of the upper esophageal phase.   I. POSTURE: Upright in MBS chair  II. VIEW: Lateral  III. COMPENSATORY STRATEGIES: N/A  IV. BOLUSES ADMINISTERED:   Thin Liquid: 1 small, 3 rapid consecutive   Nectar-thick Liquid: 1 moderate   Honey-thick Liquid: DNT   Puree: 2 teaspoon presentations   Mechanical Soft: 1/4 graham cracker in applesauce  V. RESULTS OF EVALUATION: A. ORAL PREPARATORY PHASE: (The lips, tongue, and velum are observed for strength and coordination)       **Overall Severity  Rating: within normal limits   B. SWALLOW INITIATION/REFLEX: (The reflex is normal if "triggered" by the time the bolus reached the base of the tongue)  **Overall Severity Rating: Mild-moderate; triggers while falling from the valleculae to the pyriform sinuses   C. PHARYNGEAL PHASE: (Pharyngeal function is normal if the bolus shows rapid, smooth, and continuous transit through the pharynx and there is no pharyngeal residue after the swallow)  **Overall Severity Rating: within normal limits   D. LARYNGEAL PENETRATION: (Material entering into the laryngeal inlet/vestibule but not aspirated) TRANSIENT with liquids and no laryngeal vestibule residue  E. ASPIRATION:  None  F. ESOPHAGEAL PHASE: (Screening of the upper esophagus): could not visualize  ASSESSMENT: This 84 year old woman; with history of remote subcortical infarct and Parkinson's; is presenting with minimal oropharyngeal dysphagia characterized by delayed pharyngeal swallow initiation and transient laryngeal penetration with liquids (within normal limits for her age).  Oral control of the bolus including oral hold, rotary mastication, and anterior to posterior transfer is within functional limits.   Aspects of the pharyngeal stage of swallowing including tongue base retraction, hyolaryngeal excursion, epiglottic inversion, and duration/amplitude of UES opening are within normal limits.  There is no observed pharyngeal residue or tracheal aspiration.  The patient is not at risk for prandial aspiration.  The patient reported most difficulty with pills and meat, typically associated with esophageal dysphagia.  She may benefit from referral to GI to assess esophageal function.  PLAN/RECOMMENDATIONS:   A. Diet: Regular/usual diet   B. Swallowing Precautions: No special precautions indicated by this study   C. Recommended consultation to: GI   D. Therapy recommendations: speech therapy is not indicated   E. Results and recommendations were discussed with the patient immediately following the study and the final report routed to the referring MD and the patient's PCP    Oropharyngeal dysphagia - Plan: DG SWALLOW FUNC OP MEDICARE SPEECH PATH, DG SWALLOW FUNC OP MEDICARE SPEECH PATH        Problem List Patient Active Problem List   Diagnosis Date Noted  . Unintentional weight loss 09/09/2018  . Hiatal hernia   . Dysphagia 03/19/2018  . Parkinson disease ([place]) 09/09/2017  . Carcinoma of overlapping sites of right breast in female, estrogen receptor positive ([place]) 11/16/2015  . Awareness of heartbeats 08/17/2014  . 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 Cardiac conduction disorder 06/17/2014  .  T2DM (type 2 diabetes mellitus) ([place]) 06/17/2014  . Hyperlipidemia associated with type 2 diabetes mellitus ([place]) 06/17/2014  . Adult hypothyroidism 06/17/2014  . Osteoporosis 06/17/2014  . 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 Avitaminosis D 06/17/2014   Leroy Sea, MS/CCC- SLP  Lou Miner 10/08/2018, 1:27 PM  [facility] [place], [place], [address] Phone: [phone]   Fax:     Name: Rebecca Patterson MRN: [id number] Date of Birth: May 08, 1934

## 2018-10-29 ENCOUNTER — Ambulatory Visit: Payer: Self-pay | Admitting: Family Medicine

## 2018-11-04 ENCOUNTER — Ambulatory Visit (INDEPENDENT_AMBULATORY_CARE_PROVIDER_SITE_OTHER): Payer: Medicare Other | Admitting: Family Medicine

## 2018-11-04 ENCOUNTER — Other Ambulatory Visit: Payer: Self-pay

## 2018-11-04 DIAGNOSIS — Z23 Encounter for immunization: Secondary | ICD-10-CM | POA: Diagnosis not present

## 2018-11-13 ENCOUNTER — Telehealth: Payer: Self-pay | Admitting: Internal Medicine

## 2018-11-13 NOTE — Telephone Encounter (Signed)
Phone Visit scheduled with Dr. Mortimer Fries for patient on 12/11/2018 at 3:00. Pt is aware of appointment date,time. Rhonda J Cobb Nothing else needed at this time. Rhonda J Cobb

## 2018-11-13 NOTE — Telephone Encounter (Signed)
Routing to [place].

## 2018-12-08 DIAGNOSIS — R9431 Abnormal electrocardiogram [ECG] [EKG]: Secondary | ICD-10-CM | POA: Diagnosis not present

## 2018-12-08 DIAGNOSIS — R002 Palpitations: Secondary | ICD-10-CM | POA: Diagnosis not present

## 2018-12-08 DIAGNOSIS — R079 Chest pain, unspecified: Secondary | ICD-10-CM | POA: Diagnosis not present

## 2018-12-08 DIAGNOSIS — R0602 Shortness of breath: Secondary | ICD-10-CM | POA: Diagnosis not present

## 2018-12-08 DIAGNOSIS — E119 Type 2 diabetes mellitus without complications: Secondary | ICD-10-CM | POA: Diagnosis not present

## 2018-12-10 ENCOUNTER — Other Ambulatory Visit: Payer: Self-pay

## 2018-12-10 ENCOUNTER — Encounter: Payer: Self-pay | Admitting: Family Medicine

## 2018-12-10 ENCOUNTER — Ambulatory Visit (INDEPENDENT_AMBULATORY_CARE_PROVIDER_SITE_OTHER): Payer: Medicare Other | Admitting: Family Medicine

## 2018-12-10 VITALS — BP 131/77 | HR 73 | Temp 96.9°F | Resp 16 | Ht 61.0 in | Wt 125.8 lb

## 2018-12-10 DIAGNOSIS — R634 Abnormal weight loss: Secondary | ICD-10-CM | POA: Diagnosis not present

## 2018-12-10 DIAGNOSIS — Z8603 Personal history of neoplasm of uncertain behavior: Secondary | ICD-10-CM | POA: Insufficient documentation

## 2018-12-10 DIAGNOSIS — R221 Localized swelling, mass and lump, neck: Secondary | ICD-10-CM | POA: Insufficient documentation

## 2018-12-10 HISTORY — DX: Personal history of neoplasm of uncertain behavior: Z86.03

## 2018-12-10 NOTE — Progress Notes (Signed)
Patient: Rebecca Patterson Female    DOB: July 14, 1934   84 y.o.   MRN: [id number] Visit Date: 12/10/2018  Today's Provider: Lavon Paganini, MD   Chief Complaint  Patient presents with  . Follow-up   Subjective:     HPI  Follow up for unintentional weight loss  The patient was last seen for this 3 months ago. Changes made at last visit include check labs.  Patient advised to keep log of weights. Patient does have a lymph node and some pulm nodules on CT chest- will need repeat in 6-12 months. If weight loss continues patient advised that she may need referral to Oncology for further imaging.  Wt Readings from Last 3 Encounters:  12/10/18 125 lb 12.8 oz (57.1 kg)  09/09/18 124 lb 3.2 oz (56.3 kg)  09/09/18 124 lb 3.2 oz (56.3 kg)    She brings home weight log today which shows stable weight in the range of 122-125 lb consistently at home  She is taking Zoloft and is unsure of whether this has been helpful  She has noticed lump in R neck since our last visit.  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  She denies pain, lightheadedness, neurologic changes. ------------------------------------------------------------------------------------   Allergies  Allergen Reactions  . Actonel [Risedronate]   . Boniva [Ibandronic Acid]     shakes  . Fosamax [Alendronate]     Patient reports medication caused her to severe mood changes.     Current Outpatient Medications:  .  albuterol (VENTOLIN HFA) 108 (90 Base) MCG/ACT inhaler, Inhale 2 puffs into the lungs every 4 (four) hours as needed for wheezing or shortness of breath., Disp: 1 g, Rfl: 6 .  aspirin EC 81 MG tablet, Take 81 mg by mouth daily., Disp: , Rfl:  .  calcium carbonate (OS-CAL) 600 MG TABS tablet, Take 600 mg by mouth daily with breakfast. , Disp: , Rfl:  .  carbidopa-levodopa (SINEMET IR) 25-100 MG tablet, Take 1.5 tablets by mouth 3 (three) times daily. , Disp: , Rfl:  .   docusate sodium (COLACE) 100 MG capsule, Take by mouth daily., Disp: , Rfl:  .  famotidine (PEPCID) 20 MG tablet, Take 20 mg by mouth as needed. , Disp: , Rfl:  .  ketoconazole (NIZORAL) 2 % cream, Apply 1 application topically daily as needed. , Disp: , Rfl:  .  levothyroxine (SYNTHROID, LEVOTHROID) 50 MCG tablet, TAKE 1 TABLET BY MOUTH EVERY DAY, Disp: 90 tablet, Rfl: 3 .  Multiple Vitamins-Minerals (PRESERVISION AREDS 2 PO), Take by mouth 2 (two) times daily. , Disp: , Rfl:  .  Omega-3 Fatty Acids (FISH OIL DOUBLE STRENGTH) 1200 MG CAPS, Take 1 capsule by mouth daily. , Disp: , Rfl:  .  polyethylene glycol (MIRALAX / GLYCOLAX) packet, Take 17 g by mouth daily as needed. , Disp: , Rfl:  .  pravastatin (PRAVACHOL) 40 MG tablet, TAKE 1 TABLET BY MOUTH EVERY DAY, Disp: 90 tablet, Rfl: 4 .  sertraline (ZOLOFT) 25 MG tablet, Take 25 mg by mouth daily., Disp: , Rfl:  .  triamcinolone cream (KENALOG) 0.1 %, APPLY TO AFFECTED AREA 3 TIMES A DAY FOR INSECT BITES (Patient taking differently: Apply 1 application topically as needed. ), Disp: 60 g, Rfl: 5  Review of Systems  Constitutional: Negative.   Cardiovascular: Negative.   Gastrointestinal: Negative.     Social History   Tobacco Use  . Smoking status: Never Smoker  .  Smokeless tobacco: Never Used  Substance Use Topics  . Alcohol use: No      Objective:   BP 131/77 (BP Location: Left Arm, Patient Position: Sitting, Cuff Size: Normal)   Pulse 73   Temp (!) 96.9 F (36.1 C) (Temporal)   Resp 16   Ht 5\' 1"  (1.549 m)   Wt 125 lb 12.8 oz (57.1 kg)   BMI 23.77 kg/m  Vitals:   12/10/18 1123  BP: 131/77  Pulse: 73  Resp: 16  Temp: (!) 96.9 F (36.1 C)  TempSrc: Temporal  Weight: 125 lb 12.8 oz (57.1 kg)  Height: 5\' 1"  (1.549 m)  Body mass index is 23.77 kg/m.   Physical Exam Vitals signs reviewed.  Constitutional:      General: She is not in acute distress.    Appearance: Normal appearance. She is well-developed. She is not  diaphoretic.  HENT:     Head: Normocephalic and atraumatic.  Eyes:     General: No scleral icterus.    Conjunctiva/sclera: Conjunctivae normal.  Neck:     Musculoskeletal: Neck supple.     Thyroid: No thyromegaly.     Comments: 1-1.5cm linear mass palpable along the right-sided carotid artery Cardiovascular:     Rate and Rhythm: Normal rate and regular rhythm.     Pulses: Normal pulses.     Heart sounds: Normal heart sounds. No murmur.  Pulmonary:     Effort: Pulmonary effort is normal. No respiratory distress.     Breath sounds: Normal breath sounds. No wheezing, rhonchi or rales.  Musculoskeletal:     Right lower leg: No edema.     Left lower leg: No edema.  Lymphadenopathy:     Cervical: No cervical adenopathy.  Skin:    General: Skin is warm and dry.     Capillary Refill: Capillary refill takes less than 2 seconds.     Findings: No rash.  Neurological:     Mental Status: She is alert and oriented to person, place, and time. Mental status is at baseline.  Psychiatric:        Mood and Affect: Mood normal.        Behavior: Behavior normal.      No results found for any visits on 12/10/18.     Assessment & Plan   Problem List Items Addressed This Visit      Other   Unintentional weight loss - Primary    Weight loss is stabilized and appetite has improved Suspect that this is multifactorial related to depression symptoms in the setting of Parkinson's disease as well as her other chronic medical conditions Continue Zoloft as prescribed by neurology Reviewed last labs which were benign Plan to repeat chest CT in 03/2018      Neck mass    New finding on exam today Does not feel like a lymph node and it is slightly linear It is nontender and mobile Patient has a history of carotid body tumor status post resection and states that this feels similar Obtain carotid ultrasound to evaluate further Referral to vascular surgery for further management if it is a carotid  body tumor again      Relevant Orders   Ambulatory referral to Vascular Surgery   US Carotid Duplex Bilateral   History of carotid body tumor   Relevant Orders   Ambulatory referral to Vascular Surgery   US Carotid Duplex Bilateral       Return in about 3 months (around 03/12/2019) for chronic disease  f/u.   The entirety of the information documented in the History of Present Illness, Review of Systems and Physical Exam were personally obtained by me. Portions of this information were initially documented by Lynford Humphrey, CMA and reviewed by me for thoroughness and accuracy.    Bacigalupo, Dionne Bucy, MD MPH [facility]

## 2018-12-10 NOTE — Assessment & Plan Note (Signed)
Weight loss is stabilized and appetite has improved Suspect that this is multifactorial related to depression symptoms in the setting of Parkinson's disease as well as her other chronic medical conditions Continue Zoloft as prescribed by neurology Reviewed last labs which were benign Plan to repeat chest CT in 03/2018

## 2018-12-10 NOTE — Patient Instructions (Signed)
Someone will call to schedule a neck ultrasound and appointment with Dr Lucky Cowboy or Dr Ronalee Belts  We will see you again in 3 months

## 2018-12-10 NOTE — Assessment & Plan Note (Signed)
New finding on exam today Does not feel like a lymph node and it is slightly linear It is nontender and mobile Patient has a history of carotid body tumor status post resection and states that this feels similar Obtain carotid ultrasound to evaluate further Referral to vascular surgery for further management if it is a carotid body tumor again

## 2018-12-11 ENCOUNTER — Ambulatory Visit (INDEPENDENT_AMBULATORY_CARE_PROVIDER_SITE_OTHER): Payer: Medicare Other | Admitting: Internal Medicine

## 2018-12-11 ENCOUNTER — Encounter: Payer: Self-pay | Admitting: Internal Medicine

## 2018-12-11 DIAGNOSIS — J4520 Mild intermittent asthma, uncomplicated: Secondary | ICD-10-CM | POA: Diagnosis not present

## 2018-12-11 NOTE — Patient Instructions (Signed)
Albuterol as needed Avoid allergens and second hand smoke

## 2018-12-11 NOTE — Progress Notes (Signed)
Name: Rebecca Patterson MRN: [id number] DOB: 29-Jan-1935     CONSULTATION DATE: 12/11/2018 REFERRING MD : Geri Seminole   I connected with the patient by telephone enabled telemedicine visit and verified that I am speaking with the correct person using two identifiers.    I discussed the limitations, risks, security and privacy concerns of performing an evaluation and management service by telemedicine and the availability of in-person appointments. I also discussed with the patient that there may be a patient responsible charge related to this service. The patient expressed understanding and agreed to proceed.  PATIENT AGREES AND CONFIRMS -YES   Other persons participating in the visit and their role in the encounter: Patient, nursing  This visit type was conducted due to national recommendations for restrictions regarding the COVID-19 Pandemic (e.g. social distancing).  This format is felt to be most appropriate for this patient at this time.  All issues noted in this document were discussed and addressed.     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  Patient does have a history of irregular heartbeat  Follow-up cardiology pending  Diagnosis of breast cancer status post therapy 6 years ago No acute respiratory distress   CHIEF COMPLAINT:  Follow up SOB   HISTORY OF PRESENT ILLNESS: Patient was prescribed albuterol inhaler at previous visit Her symptoms seem to be resolving with inhaler therapy as needed  No evidence of heart failure at this time No evidence or  signs of infection at this time No respiratory distress No fevers, chills, nausea, vomiting, diarrhea No evidence of lower extremity edema No evidence hemoptysis       PAST MEDICAL HISTORY :   has a past medical history of Allergy, Breast cancer ([place]) (2013), Cancer ([place]) ([place]), Complication of anesthesia, Corn, Family history of adverse reaction to anesthesia, Fibrocystic breast, GERD (gastroesophageal reflux disease), Hyperlipidemia, Osteoporosis, Osteoporosis, Personal history of radiation therapy, PONV (postoperative nausea and vomiting), Thyroid disease, Vertigo, Wears dentures, and Wears hearing aid.  has a past surgical history that includes Abdominal hysterectomy; Cholecystectomy; Carotid body tumor excision (Right, 10/19/2005); Knee surgery (Right, 2005); Bunionectomy (Bilateral, 2004); Thyroid surgery (N/A, 1980); Mastoid surgery (Right); Skin cancer excision (03/17/2014); Cataract extraction w/PHACO (Right, 11/10/2014); Breast lumpectomy (Right); Appendectomy; Esophagogastroduodenoscopy (egd) with propofol (N/A, 04/23/2018); Breast biopsy (Right, 2013); and Breast cyst excision (Right, n/a). Prior to Admission medications   Medication Sig Start Date End Date Taking? Authorizing Provider  aspirin EC 81 MG tablet Take 81 mg by mouth daily.    [provider]  calcium carbonate (OS-CAL) 600 MG TABS tablet Take 600 mg by mouth daily with breakfast.  01/20/10   [provider]  carbidopa-levodopa (SINEMET IR) 25-100 MG tablet TAKE 1 TABLET BY MOUTH THREE TIMES A DAY 12/02/17   [provider]  docusate sodium (COLACE) 100 MG capsule Take by mouth daily.    [provider]  famotidine (PEPCID) 20 MG tablet Take 20 mg by mouth as needed.     [provider]  ketoconazole (NIZORAL) 2 % cream Apply 1 application topically daily as needed.  08/07/15   [provider]  levothyroxine (SYNTHROID, LEVOTHROID) 50 MCG tablet TAKE 1 TABLET BY MOUTH  EVERY DAY 03/03/18   Bacigalupo, Dionne Bucy, MD  metFORMIN (GLUCOPHAGE) 500 MG tablet TAKE 1 TABLET (500 MG TOTAL) BY MOUTH 2 (TWO) TIMES DAILY WITH A MEAL. 03/03/18   Virginia Crews, MD  Multiple Vitamins-Minerals (PRESERVISION AREDS 2 PO) Take by mouth.    [provider]  nystatin cream (MYCOSTATIN) APPLY TO AFFECTED AREA TWICE A DAY AS NEEDED 08/04/15   Margarita Rana, MD  Omega-3 Fatty Acids (FISH OIL DOUBLE STRENGTH) 1200 MG CAPS Take 1 capsule by mouth daily.  01/20/10   [provider]  polyethylene glycol (MIRALAX / GLYCOLAX) packet Take 17 g by mouth daily.    [provider]  pravastatin (PRAVACHOL) 40 MG tablet TAKE 1 TABLET BY MOUTH EVERY DAY 06/02/18   Virginia Crews, MD  triamcinolone cream (KENALOG) 0.1 % APPLY TO AFFECTED AREA 3 TIMES A DAY FOR INSECT BITES Patient not taking: Reported on 06/16/2018 12/06/16   Birdie Sons, MD   Allergies  Allergen Reactions  . Actonel [Risedronate]   . Boniva [Ibandronic Acid]     shakes  . Fosamax [Alendronate]     Patient reports medication caused her to severe mood changes.    FAMILY HISTORY:  family history includes Breast cancer in an other family member; Breast cancer (age of onset: 34) in her sister; Stroke in her sister. SOCIAL HISTORY:  reports that she has never smoked. She has never used smokeless tobacco. She reports that she does not drink alcohol or use drugs.     Review of Systems:  Gen:  Denies  fever, sweats, chills weight loss  HEENT: Denies blurred vision, double vision, ear pain, eye pain, hearing loss, nose bleeds, sore throat Cardiac:  No dizziness, chest pain or heaviness, chest tightness,edema, No JVD Resp:   No cough, -sputum production, -shortness of breath,-wheezing, -hemoptysis,  Gi: Denies swallowing difficulty, stomach pain, nausea or vomiting, diarrhea, constipation, bowel incontinence Gu:  Denies bladder incontinence, burning urine Ext:   Denies Joint pain,  stiffness or swelling Skin: Denies  skin rash, easy bruising or bleeding or hives Endoc:  Denies polyuria, polydipsia , polyphagia or weight change Psych:   Denies depression, insomnia or hallucinations  Other:  All other systems negative       ASSESSMENT AND PLAN SYNOPSIS  84 year old pleasant white female with progressive shortness of breath and dyspnea exertion most likely related to advanced age in the setting of decreased physiological pulmonary function by aging process in the setting of kyphosis with restrictive lung disease with possible underlying intermittent reactive airways disease  ALBUTEROL INH 2 to 4 puffs as needed is helping tremendously Continue as needed   COVID-19 EDUCATION: The signs and symptoms of COVID-19 were discussed with the patient and how to seek care for testing.  The importance of social distancing was discussed today. Hand Washing Techniques and avoid touching face was advised.     MEDICATION ADJUSTMENTS/LABS AND TESTS ORDERED: Albuterol MDIs 2 to 4 puffs every 4 hours as needed   CURRENT MEDICATIONS REVIEWED AT LENGTH WITH PATIENT TODAY   Patient satisfied with Plan of action and management. All questions answered  Follow up in 1 year  Total time spent 21 minutes   Maretta Bees Patricia Pesa, M.D.  [facility]  Medical Director [place] Director [facility] Cardio-Pulmonary Department

## 2018-12-12 ENCOUNTER — Encounter (INDEPENDENT_AMBULATORY_CARE_PROVIDER_SITE_OTHER): Payer: Self-pay | Admitting: Vascular Surgery

## 2018-12-12 ENCOUNTER — Other Ambulatory Visit: Payer: Self-pay

## 2018-12-12 ENCOUNTER — Ambulatory Visit (INDEPENDENT_AMBULATORY_CARE_PROVIDER_SITE_OTHER): Payer: Medicare Other | Admitting: Vascular Surgery

## 2018-12-12 VITALS — BP 108/68 | HR 87 | Resp 16 | Ht 60.0 in | Wt 126.0 lb

## 2018-12-12 DIAGNOSIS — E785 Hyperlipidemia, unspecified: Secondary | ICD-10-CM

## 2018-12-12 DIAGNOSIS — D355 Benign neoplasm of carotid body: Secondary | ICD-10-CM

## 2018-12-12 DIAGNOSIS — E1169 Type 2 diabetes mellitus with other specified complication: Secondary | ICD-10-CM

## 2018-12-12 DIAGNOSIS — E119 Type 2 diabetes mellitus without complications: Secondary | ICD-10-CM

## 2018-12-12 DIAGNOSIS — Z8603 Personal history of neoplasm of uncertain behavior: Secondary | ICD-10-CM

## 2018-12-12 DIAGNOSIS — R221 Localized swelling, mass and lump, neck: Secondary | ICD-10-CM | POA: Diagnosis not present

## 2018-12-12 NOTE — Assessment & Plan Note (Signed)
lipid control important in reducing the progression of atherosclerotic disease. Continue statin therapy  

## 2018-12-12 NOTE — Progress Notes (Signed)
Patient ID: Rebecca Patterson, female   DOB: Dec 31, 1934, 84 y.o.   MRN: [id number]  Chief Complaint  Patient presents with  . New Patient (Initial Visit)    neck mass    HPI Rebecca Patterson is a 84 y.o. female.  I am asked to see the patient by Dr. Brita Romp for evaluation of a neck mass.  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  She did well from this.  A few weeks ago, she noticed swelling and fullness in her right neck.  She states that this feels just like it did when she had her tumor back in 2007.  No focal neurologic symptoms. Specifically, the patient denies amaurosis fugax, speech or swallowing difficulties, or arm or leg weakness or numbness.  No left neck masses or symptoms.    Past Medical History:  Diagnosis Date  . Allergy   . Breast cancer ([place]) 2013   Rt  . Cancer [facility]) 2013   Right Breast- radiation and Tamoxifen  . Complication of anesthesia   . Corn   . Family history of adverse reaction to anesthesia    sister - slow to wake  . Fibrocystic breast   . GERD (gastroesophageal reflux disease)   . Hyperlipidemia   . Osteoporosis   . Osteoporosis   . Personal history of radiation therapy   . PONV (postoperative nausea and vomiting)   . Thyroid disease   . Vertigo    long ago  . Wears dentures    full upper and lower  . Wears hearing aid    bilateral    Past Surgical History:  Procedure Laterality Date  . ABDOMINAL HYSTERECTOMY    . APPENDECTOMY    . BREAST BIOPSY Right 2013   Stereo- Positive  . BREAST CYST EXCISION Right n/a   cyst removed  . BREAST LUMPECTOMY Right    2013  . BUNIONECTOMY Bilateral 2004  . 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 CATARACT EXTRACTION W/PHACO Right 11/10/2014   Procedure: CATARACT EXTRACTION PHACO AND INTRAOCULAR LENS PLACEMENT (IOC);  Surgeon: Leandrew Koyanagi, MD;  Location: [place];  Service: Ophthalmology;  Laterality:  Right;  . CHOLECYSTECTOMY    . ESOPHAGOGASTRODUODENOSCOPY (EGD) WITH PROPOFOL N/A 04/23/2018   Procedure: ESOPHAGOGASTRODUODENOSCOPY (EGD) WITH PROPOFOL;  Surgeon: Virgel Manifold, MD;  Location: [facility];  Service: Endoscopy;  Laterality: N/A;  pt prefers later in am  . KNEE SURGERY Right 2005  . Mastoid surgery Right   . SKIN CANCER EXCISION  03/17/2014   right side of face Dr. Aubery Lapping  . THYROID SURGERY N/A 1980   Goiter    Family History  Problem Relation Age of Onset  . Stroke Sister   . Breast cancer Sister 23  . Breast cancer Other   . Colon cancer Neg Hx   no bleeding or clotting disorders   Social History   Tobacco Use  . Smoking status: Never Smoker  . Smokeless tobacco: Never Used  Substance Use Topics  . Alcohol use: No  . Drug use: No    Allergies  Allergen Reactions  . Actonel [Risedronate]   . Boniva [Ibandronic Acid]     shakes  . Fosamax [Alendronate]     Patient reports medication caused her to severe mood changes.    Current Outpatient Medications  Medication Sig Dispense Refill  . albuterol (VENTOLIN HFA) 108 (90 Base) MCG/ACT inhaler Inhale  2 puffs into the lungs every 4 (four) hours as needed for wheezing or shortness of breath. 1 g 6  . aspirin EC 81 MG tablet Take 81 mg by mouth daily.    . calcium carbonate (OS-CAL) 600 MG TABS tablet Take 600 mg by mouth daily with breakfast.     . carbidopa-levodopa (SINEMET IR) 25-100 MG tablet Take 1.5 tablets by mouth 3 (three) times daily.     Marland Kitchen docusate sodium (COLACE) 100 MG capsule Take by mouth daily.    . famotidine (PEPCID) 20 MG tablet Take 20 mg by mouth as needed.     Marland Kitchen ketoconazole (NIZORAL) 2 % cream Apply 1 application topically daily as needed.     Marland Kitchen levothyroxine (SYNTHROID, LEVOTHROID) 50 MCG tablet TAKE 1 TABLET BY MOUTH EVERY DAY 90 tablet 3  . Multiple Vitamins-Minerals (PRESERVISION AREDS 2 PO) Take by mouth 2 (two) times daily.     . Omega-3 Fatty Acids (FISH OIL  DOUBLE STRENGTH) 1200 MG CAPS Take 1 capsule by mouth daily.     . polyethylene glycol (MIRALAX / GLYCOLAX) packet Take 17 g by mouth daily as needed.     . pravastatin (PRAVACHOL) 40 MG tablet TAKE 1 TABLET BY MOUTH EVERY DAY 90 tablet 4  . sertraline (ZOLOFT) 25 MG tablet Take 25 mg by mouth daily.    Marland Kitchen triamcinolone cream (KENALOG) 0.1 % APPLY TO AFFECTED AREA 3 TIMES A DAY FOR INSECT BITES (Patient taking differently: Apply 1 application topically as needed. ) 60 g 5   No current facility-administered medications for this visit.       REVIEW OF SYSTEMS (Negative unless checked)  Constitutional: [] Weight loss  [] Fever  [] Chills Cardiac: [] Chest pain   [] Chest pressure   [] Palpitations   [] Shortness of breath when laying flat   [] Shortness of breath at rest   [] Shortness of breath with exertion. Vascular:  [] Pain in legs with walking   [] Pain in legs at rest   [] Pain in legs when laying flat   [] Claudication   [] Pain in feet when walking  [] Pain in feet at rest  [] Pain in feet when laying flat   [] History of DVT   [] Phlebitis   [] Swelling in legs   [] Varicose veins   [] Non-healing ulcers Pulmonary:   [] Uses home oxygen   [] Productive cough   [] Hemoptysis   [] Wheeze  [] COPD   [] Asthma Neurologic:  [] Dizziness  [] Blackouts   [] Seizures   [] History of stroke   [] History of TIA  [] Aphasia   [] Temporary blindness   [] Dysphagia   [] Weakness or numbness in arms   [] Weakness or numbness in legs Musculoskeletal:  [x] Arthritis   [] Joint swelling   [] Joint pain   [] Low back pain Hematologic:  [] Easy bruising  [] Easy bleeding   [] Hypercoagulable state   [] Anemic  [] Hepatitis Gastrointestinal:  [] Blood in stool   [] Vomiting blood  [x] Gastroesophageal reflux/heartburn   [] Abdominal pain Genitourinary:  [] Chronic kidney disease   [] Difficult urination  [] Frequent urination  [] Burning with urination   [] Hematuria Skin:  [] Rashes   [] Ulcers   [] Wounds Psychological:  [] History of anxiety   []  History of  major depression.    Physical Exam BP 108/68 (BP Location: Right Arm)   Pulse 87   Resp 16   Ht 5' (1.524 m)   Wt 126 lb (57.2 kg)   BMI 24.61 kg/m  Gen:  WD/WN, NAD Head: [place]/AT, No temporalis wasting.  Ear/Nose/Throat: Hearing grossly intact, nares w/o erythema or drainage, oropharynx w/o  Erythema/Exudate Eyes: Conjunctiva clear, sclera non-icteric  Neck: trachea midline.  No bruit or JVD.  The right carotid pulse is quite prominent and is just medial to the previous incision. Pulmonary:  Good air movement, clear to auscultation bilaterally.  Cardiac: RRR, normal S1, S2 Vascular:  Vessel Right Left  Radial Palpable Palpable                                   Gastrointestinal: soft, non-tender/non-distended.  Musculoskeletal: M/S 5/5 throughout.  Extremities without ischemic changes.  No deformity or atrophy.  No significant lower extremity edema. Neurologic: Sensation grossly intact in extremities.  Symmetrical.  Speech is fluent. Motor exam as listed above. Psychiatric: Judgment intact, Mood & affect appropriate for pt's clinical situation. Dermatologic: No rashes or ulcers noted.  No cellulitis or open wounds. Lymph : No Cervical, Axillary, or Inguinal lymphadenopathy.   Radiology No results found.  Labs No results found for this or any previous visit (from the past 2160 hour(s)).  Assessment/Plan:  T2DM (type 2 diabetes mellitus) (HCC) blood glucose control important in reducing the progression of atherosclerotic disease. Also, involved in wound healing. On appropriate medications.   Hyperlipidemia associated with type 2 diabetes mellitus (HCC) lipid control important in reducing the progression of atherosclerotic disease. Continue statin therapy   History of carotid body tumor With a history of a carotid body tumor and a prominent palpable carotid pulse, I think we should go ahead and get a CT angiogram for full evaluation for possible recurrent carotid  body tumor.  This will be done in the near future at the patient's convenience and we will see her back to discuss the results and determine further treatment options.  Neck mass The patient has an asymptomatic palpable mass in the right neck that she has noticed over the past several weeks to months. With a history of a carotid body tumor and a prominent palpable carotid pulse, I think we should go ahead and get a CT angiogram for full evaluation for possible recurrent carotid body tumor.  This will be done in the near future at the patient's convenience and we will see her back to discuss the results and determine further treatment options.      Leotis Pain 12/12/2018, 11:29 AM   This note was created with Dragon medical transcription system.  Any errors from dictation are unintentional.

## 2018-12-12 NOTE — Assessment & Plan Note (Signed)
With a history of a carotid body tumor and a prominent palpable carotid pulse, I think we should go ahead and get a CT angiogram for full evaluation for possible recurrent carotid body tumor.  This will be done in the near future at the patient's convenience and we will see her back to discuss the results and determine further treatment options.

## 2018-12-12 NOTE — Patient Instructions (Signed)
Carotid Artery Disease  The carotid arteries are arteries on both sides of the neck. They carry blood to the brain, face, and neck. Carotid artery disease happens when these arteries become smaller (narrow) or get blocked. If these arteries become smaller or get blocked, you are more likely to have a stroke or a warning stroke (transient ischemic attack). Follow these instructions at home:  Take over-the-counter and prescription medicines only as told by your doctor.  Make sure you understand all instructions about your medicines. Do not stop taking your medicines without talking to your doctor first.  Follow your doctor's diet instructions. It is important to follow a healthy diet. ? Eat foods that include plenty of: ? Fresh fruits. ? Vegetables. ? Lean meats. ? Avoid these foods: ? Foods that are high in fat. ? Foods that are high in salt (sodium). ? Foods that are fried. ? Foods that are processed. ? Foods that have few good nutrients (poor nutritional value).  Keep a healthy weight.  Stay active. Get at least 30 minutes of activity every day.  Do not smoke.  Limit alcohol use to: ? No more than 2 drinks a day for men. ? No more than 1 drink a day for women who are not pregnant.  Do not use illegal drugs.  Keep all follow-up visits as told by your doctor. This is important. Contact a doctor if: Get help right away if:  You have any symptoms of stroke or TIA. The acronym BEFAST is an easy way to remember the main warning signs of stroke. ? B = Balance problems. Signs include dizziness, sudden trouble walking, or loss of balance ? E = Eye problems. This includes trouble seeing or a sudden change in vision. ? F = Face changes. This includes sudden weakness or numbness of the face, or the face or eyelid drooping to one side. ? A = Arm weakness or numbness. This happens suddenly and usually on one side of the body. ? S = Speech problems. This includes trouble speaking or  trouble understanding. ? T = Time. Time to call 911 or seek emergency care. Do not wait to see if symptoms go away. Make note of the time your symptoms started.  Other signs of stroke may include: ? A sudden, severe headache with no known cause. ? Feeling sick to your stomach (nauseous) or throwing up (vomiting). ? Seizure. Call your local emergency services (911 in [place] Do notdrive yourself to the clinic or hospital. Summary  The carotid arteries are arteries on both sides of the neck.  If these arteries get smaller or get blocked, you are more likely to have a stroke or a warning stroke (transient ischemic attack).  Take over-the-counter and prescription medicines only as told by your doctor.  Keep all follow-up visits as told by your doctor. This is important. This information is not intended to replace advice given to you by your health care provider. Make sure you discuss any questions you have with your health care provider. Document Released: 01/09/2012 Document Revised: 01/17/2017 Document Reviewed: 01/17/2017 Elsevier Patient Education  2020 [employer]  

## 2018-12-12 NOTE — Assessment & Plan Note (Signed)
blood glucose control important in reducing the progression of atherosclerotic disease. Also, involved in wound healing. On appropriate medications.  

## 2018-12-12 NOTE — Assessment & Plan Note (Signed)
The patient has an asymptomatic palpable mass in the right neck that she has noticed over the past several weeks to months. With a history of a carotid body tumor and a prominent palpable carotid pulse, I think we should go ahead and get a CT angiogram for full evaluation for possible recurrent carotid body tumor.  This will be done in the near future at the patient's convenience and we will see her back to discuss the results and determine further treatment options.

## 2018-12-15 ENCOUNTER — Telehealth: Payer: Self-pay | Admitting: Family Medicine

## 2018-12-15 DIAGNOSIS — R0600 Dyspnea, unspecified: Secondary | ICD-10-CM | POA: Diagnosis not present

## 2018-12-15 DIAGNOSIS — G20 Parkinson's disease: Secondary | ICD-10-CM | POA: Diagnosis not present

## 2018-12-15 NOTE — Telephone Encounter (Signed)
Ultrasound canceled.

## 2018-12-15 NOTE — Telephone Encounter (Signed)
Pt wants to cancel the CT scan order. She has already been to a Psychologist, sport and exercise that is taking over this issue now.  Thanks, [employer]

## 2018-12-15 NOTE — Telephone Encounter (Signed)
I believe that she is talking about the ultrasound carotids that was ordered at her last visit.  Please go ahead and cancel this order.  Do not cancel the CT scan as this was ordered by the specialist who is looking into this further

## 2018-12-18 ENCOUNTER — Ambulatory Visit: Payer: Medicare Other

## 2018-12-29 ENCOUNTER — Other Ambulatory Visit: Payer: Self-pay

## 2018-12-29 ENCOUNTER — Ambulatory Visit
Admission: RE | Admit: 2018-12-29 | Discharge: 2018-12-29 | Disposition: A | Discharge status: Home / Self Care | Payer: Medicare Other | Source: Ambulatory Visit | Attending: Vascular Surgery | Admitting: Vascular Surgery

## 2018-12-29 DIAGNOSIS — D355 Benign neoplasm of carotid body: Secondary | ICD-10-CM

## 2018-12-29 LAB — POCT I-STAT CREATININE: Creatinine, Ser: 0.7 mg/dL (ref 0.44–1.00)

## 2018-12-29 MED ORDER — IOHEXOL 350 MG/ML SOLN
75.0000 mL | Freq: Once | INTRAVENOUS | Status: AC | PRN
  Administered 2018-12-29: 75 mL via INTRAVENOUS

## 2019-01-06 DIAGNOSIS — H353132 Nonexudative age-related macular degeneration, bilateral, intermediate dry stage: Secondary | ICD-10-CM | POA: Diagnosis not present

## 2019-01-06 LAB — HM DIABETES EYE EXAM

## 2019-01-07 ENCOUNTER — Other Ambulatory Visit: Payer: Self-pay | Admitting: Family Medicine

## 2019-01-07 DIAGNOSIS — E039 Hypothyroidism, unspecified: Secondary | ICD-10-CM

## 2019-01-08 LAB — HM DIABETES FOOT EXAM: HM Diabetic Foot Exam: NORMAL

## 2019-01-09 ENCOUNTER — Encounter: Payer: Self-pay | Admitting: Family Medicine

## 2019-01-13 ENCOUNTER — Encounter (INDEPENDENT_AMBULATORY_CARE_PROVIDER_SITE_OTHER): Payer: Self-pay | Admitting: Vascular Surgery

## 2019-01-13 ENCOUNTER — Ambulatory Visit (INDEPENDENT_AMBULATORY_CARE_PROVIDER_SITE_OTHER): Payer: Medicare Other | Admitting: Vascular Surgery

## 2019-01-13 ENCOUNTER — Other Ambulatory Visit: Payer: Self-pay

## 2019-01-13 VITALS — BP 117/77 | HR 71 | Resp 17 | Ht 62.0 in | Wt 128.0 lb

## 2019-01-13 DIAGNOSIS — Z8603 Personal history of neoplasm of uncertain behavior: Secondary | ICD-10-CM | POA: Diagnosis not present

## 2019-01-13 DIAGNOSIS — E119 Type 2 diabetes mellitus without complications: Secondary | ICD-10-CM

## 2019-01-13 DIAGNOSIS — E1169 Type 2 diabetes mellitus with other specified complication: Secondary | ICD-10-CM | POA: Diagnosis not present

## 2019-01-13 DIAGNOSIS — J358 Other chronic diseases of tonsils and adenoids: Secondary | ICD-10-CM | POA: Diagnosis not present

## 2019-01-13 DIAGNOSIS — E785 Hyperlipidemia, unspecified: Secondary | ICD-10-CM | POA: Diagnosis not present

## 2019-01-13 NOTE — Assessment & Plan Note (Signed)
She has undergone a CT scan of the neck which I have independently reviewed.  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  The only abnormality on the CT scan was of a lesion in her left palatine tonsil that measures less than a centimeter.  There was question whether or not this may have been present on her CT scan in 2007, but the radiologist felt it was much more conspicuous on the CT. With this finding on CT scan, I would recommend referral to [facility] for further evaluation.  We will facilitate that referral.

## 2019-01-13 NOTE — Progress Notes (Signed)
MRN : [id number]  Rebecca Patterson is a 84 y.o. (November 02, 1934) female who presents with chief complaint of  Chief Complaint  Patient presents with   Follow-up    review CT results  .  History of Present Illness: Patient returns today in follow up of neck mass.  She has no new complaints today.  She has undergone a CT scan of the neck which I have independently reviewed.  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  The only abnormality on the CT scan was of a lesion in her left palatine tonsil that measures less than a centimeter.  There was question whether or not this may have been present on her CT scan in 2007, but the radiologist felt it was much more conspicuous on the CT.  Current Outpatient Medications  Medication Sig Dispense Refill   albuterol (VENTOLIN HFA) 108 (90 Base) MCG/ACT inhaler Inhale 2 puffs into the lungs every 4 (four) hours as needed for wheezing or shortness of breath. 1 g 6   aspirin EC 81 MG tablet Take 81 mg by mouth daily.     calcium carbonate (OS-CAL) 600 MG TABS tablet Take 600 mg by mouth daily with breakfast.      carbidopa-levodopa (SINEMET IR) 25-100 MG tablet Take 1.5 tablets by mouth 3 (three) times daily.      docusate sodium (COLACE) 100 MG capsule Take by mouth daily.     famotidine (PEPCID) 20 MG tablet Take 20 mg by mouth as needed.      ketoconazole (NIZORAL) 2 % cream Apply 1 application topically daily as needed.      levothyroxine (SYNTHROID) 50 MCG tablet TAKE 1 TABLET BY MOUTH EVERY DAY 90 tablet 3   Multiple Vitamins-Minerals (PRESERVISION AREDS 2 PO) Take by mouth 2 (two) times daily.      polyethylene glycol (MIRALAX / GLYCOLAX) packet Take 17 g by mouth daily as needed.      pravastatin (PRAVACHOL) 40 MG tablet TAKE 1 TABLET BY MOUTH EVERY DAY 90 tablet 4   sertraline (ZOLOFT) 25 MG tablet Take 25 mg by mouth daily.      triamcinolone cream (KENALOG) 0.1 % APPLY TO AFFECTED AREA 3 TIMES A DAY FOR INSECT BITES (Patient taking differently: Apply 1 application topically as needed. ) 60 g 5   Omega-3 Fatty Acids (FISH OIL DOUBLE STRENGTH) 1200 MG CAPS Take 1 capsule by mouth daily.      No current facility-administered medications for this visit.     Past Medical History:  Diagnosis Date   Allergy    Breast cancer, right ([place]) 2013   Lumpectomy with Mammosite radiation and Tamoxifen   Complication of anesthesia    Corn    Family history of adverse reaction to anesthesia    sister - slow to wake   Fibrocystic breast    GERD (gastroesophageal reflux disease)    Hyperlipidemia    Osteoporosis    Osteoporosis    Personal history of radiation therapy    PONV (postoperative nausea and vomiting)    Thyroid disease    Vertigo    long ago   Wears dentures    full upper and lower   Wears hearing aid    bilateral    Past Surgical History:  Procedure Laterality Date   ABDOMINAL HYSTERECTOMY     APPENDECTOMY     BREAST BIOPSY  Right 2013   Stereo- Positive   BREAST CYST EXCISION Right n/a   cyst removed   BREAST LUMPECTOMY Right    2013   BUNIONECTOMY Bilateral 2004   CAROTID BODY TUMOR EXCISION Right 10/19/2005   CATARACT EXTRACTION W/PHACO Right 11/10/2014   Procedure: CATARACT EXTRACTION PHACO AND INTRAOCULAR LENS PLACEMENT ([place]);  Surgeon: Leandrew Koyanagi, MD;  Location: [place];  Service: Ophthalmology;  Laterality: Right;   CHOLECYSTECTOMY     ESOPHAGOGASTRODUODENOSCOPY (EGD) WITH PROPOFOL N/A 04/23/2018   Procedure: ESOPHAGOGASTRODUODENOSCOPY (EGD) WITH PROPOFOL;  Surgeon: Virgel Manifold, MD;  Location: [facility];  Service: Endoscopy;  Laterality: N/A;  pt prefers later in am   KNEE SURGERY Right 2005   Mastoid surgery Right    SKIN CANCER EXCISION  03/17/2014   right side of face Dr. Aubery Lapping   THYROID SURGERY N/A 1980   Goiter      Social History   Tobacco Use   Smoking status: Never Smoker   Smokeless tobacco: Never Used  Substance Use Topics   Alcohol use: No   Drug use: No    Family History  Problem Relation Age of Onset   Stroke Sister    Breast cancer Sister 22   Breast cancer Other    Colon cancer Neg Hx      Allergies  Allergen Reactions   Actonel [Risedronate]    Boniva [Ibandronic Acid]     shakes   Fosamax [Alendronate]     Patient reports medication caused her to severe mood changes.    REVIEW OF SYSTEMS (Negative unless checked)  Constitutional: [] ?Weight loss  [] ?Fever  [] ?Chills Cardiac: [] ?Chest pain   [] ?Chest pressure   [] ?Palpitations   [] ?Shortness of breath when laying flat   [] ?Shortness of breath at rest   [] ?Shortness of breath with exertion. Vascular:  [] ?Pain in legs with walking   [] ?Pain in legs at rest   [] ?Pain in legs when laying flat   [] ?Claudication   [] ?Pain in feet when walking  [] ?Pain in feet at rest  [] ?Pain in feet when laying flat   [] ?History of DVT   [] ?Phlebitis   [] ?Swelling in legs   [] ?Varicose veins   [] ?Non-healing ulcers Pulmonary:   [] ?Uses home oxygen   [] ?Productive cough   [] ?Hemoptysis   [] ?Wheeze  [] ?COPD   [] ?Asthma Neurologic:  [] ?Dizziness  [] ?Blackouts   [] ?Seizures   [] ?History of stroke   [] ?History of TIA  [] ?Aphasia   [] ?Temporary blindness   [] ?Dysphagia   [] ?Weakness or numbness in arms   [] ?Weakness or numbness in legs Musculoskeletal:  [x] ?Arthritis   [] ?Joint swelling   [] ?Joint pain   [] ?Low back pain Hematologic:  [] ?Easy bruising  [] ?Easy bleeding   [] ?Hypercoagulable state   [] ?Anemic  [] ?Hepatitis Gastrointestinal:  [] ?Blood in stool   [] ?Vomiting blood  [x] ?Gastroesophageal reflux/heartburn   [] ?Abdominal pain Genitourinary:  [] ?Chronic kidney disease   [] ?Difficult urination  [] ?Frequent urination  [] ?Burning with urination   [] ?Hematuria Skin:  [] ?Rashes   [] ?Ulcers   [] ?Wounds Psychological:  [] ?History of  anxiety   [] ? History of major depression.  Physical Examination  BP 117/77 (BP Location: Right Arm)    Pulse 71    Resp 17    Ht 5\' 2"  (1.575 m)    Wt 128 lb (58.1 kg)    BMI 23.41 kg/m  Gen:  WD/WN, NAD Head: [place]/AT, No temporalis wasting. Ear/Nose/Throat: Hearing diminished, nares w/o erythema or drainage Eyes: Conjunctiva clear. Sclera non-icteric Neck: Supple.  Trachea midline.  Right carotid pulse is slightly prominent but no obvious masses palpable. Pulmonary:  Good air movement, no use of accessory muscles.  Cardiac: RRR, no JVD Vascular:  Vessel Right Left  Radial Palpable Palpable                    Musculoskeletal: M/S 5/5 throughout.  No deformity or atrophy.  Neurologic: Sensation grossly intact in extremities.  Symmetrical.  Speech is fluent.  Psychiatric: Judgment intact, Mood & affect appropriate for pt's clinical situation. Dermatologic: No rashes or ulcers noted.  No cellulitis or open wounds.       Labs Recent Results (from the past 2160 hour(s))  I-STAT creatinine     Status: None   Collection Time: 12/29/18 11:03 AM  Result Value Ref Range   Creatinine, Ser 0.70 0.44 - 1.00 mg/dL  HM DIABETES EYE EXAM     Status: None   Collection Time: 01/06/19 12:00 AM  Result Value Ref Range   HM Diabetic Eye Exam No Retinopathy No Retinopathy    Radiology Ct Angio Neck W/cm &/or Wo/cm  Result Date: 12/29/2018 CLINICAL DATA:  Benign neoplasm of carotid body. Additional history provided: Patient reports mid neck "knot" that was first noticed approximately 6 months ago. No change in size. History of dysphagia. History of right breast cancer in 2013 with lumpectomy EXAM: CT ANGIOGRAPHY NECK TECHNIQUE: Multidetector CT imaging of the neck was performed using the standard protocol during bolus administration of intravenous contrast. Multiplanar CT image reconstructions and MIPs were obtained to evaluate the vascular anatomy. Carotid stenosis measurements (when  applicable) are obtained utilizing NASCET criteria, using the distal internal carotid diameter as the denominator. CONTRAST:  34mL OMNIPAQUE IOHEXOL 350 MG/ML SOLN COMPARISON:  Fluoroscopic swallowing study 10/08/2018, CTA neck 09/21/2005, CT neck 09/11/2005. FINDINGS: Aortic arch: Standard aortic branching. Visualized portions of the aortic arch demonstrate no evidence of dissection or aneurysm. Right carotid system: CCA and ICA widely patent within the neck without measurable stenosis. The visualized portions of the intracranial internal carotid artery are also widely patent. Left carotid system: CCA and ICA widely patent within the neck without measurable stenosis. The visualized portions of the intracranial internal carotid artery are also widely patent. Vertebral arteries: The left vertebral artery is slightly dominant. The vertebral arteries are widely patent bilaterally without measurable stenosis. The visualized basilar artery is also widely patent. Skeleton: No acute bony abnormality. Nonspecific diffuse osseous heterogeneity. Cervical spondylosis without high-grade bony spinal canal narrowing. Other neck: There is a peripherally enhancing focus within the left palatine tonsil measuring 0.9 x 0.5 x 0.9 cm (series 6, image 156) (series 8, image 68). No appreciable soft tissue neck mass elsewhere. No pathologically enlarged cervical chain lymph nodes. 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 Upper chest: No consolidation within the imaged lung apices. Other: Partial opacification of left mastoid air cells with coalescent osseous erosion and reactive osteitis. Findings consistent with chronic mastoiditis. These results will be called to the ordering clinician or representative by the Radiologist Assistant, and communication documented in the PACS or zVision Dashboard. IMPRESSION: 0.9 x 0.5 x 0.9 cm peripherally enhancing lesion within the left  palatine tonsil. This finding is indeterminate. Correlation with direct visualization is recommended to exclude a primary malignancy at this site. This finding may have been present on prior CT neck 09/11/2005 (but is much more conspicuous on today's study) and was not definitively appreciated on prior CTA neck 09/21/2005. No appreciable soft tissue  neck mass elsewhere. 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 No cervical lymphadenopathy. Electronically Signed   By: Kellie Simmering DO   On: 12/29/2018 13:07    Assessment/Plan T2DM (type 2 diabetes mellitus) ([place]) blood glucose control important in reducing the progression of atherosclerotic disease. Also, involved in wound healing. On appropriate medications.   Hyperlipidemia associated with type 2 diabetes mellitus (HCC) lipid control important in reducing the progression of atherosclerotic disease. Continue statin therapy   History of carotid body tumor No obvious recurrence on CT scan of the neck which I have independently reviewed.  Tonsillar mass She has undergone a CT scan of the neck which I have independently reviewed.  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  The only abnormality on the CT scan was of a lesion in her left palatine tonsil that measures less than a centimeter.  There was question whether or not this may have been present on her CT scan in 2007, but the radiologist felt it was much more conspicuous on the CT. With this finding on CT scan, I would recommend referral to [facility] ENT for further evaluation.  We will facilitate that referral.    Leotis Pain, MD  01/13/2019 2:04 PM    This note was created with Dragon medical transcription system.  Any errors from dictation are purely unintentional

## 2019-01-22 ENCOUNTER — Other Ambulatory Visit: Payer: Self-pay

## 2019-01-22 ENCOUNTER — Encounter (INDEPENDENT_AMBULATORY_CARE_PROVIDER_SITE_OTHER): Payer: Self-pay | Admitting: Otolaryngology

## 2019-01-22 ENCOUNTER — Ambulatory Visit (INDEPENDENT_AMBULATORY_CARE_PROVIDER_SITE_OTHER): Payer: Medicare Other | Admitting: Otolaryngology

## 2019-01-22 VITALS — Temp 97.9°F

## 2019-01-22 DIAGNOSIS — J358 Other chronic diseases of tonsils and adenoids: Secondary | ICD-10-CM | POA: Diagnosis not present

## 2019-01-22 NOTE — Progress Notes (Signed)
HPI: Rebecca Patterson is a 84 y.o. female who presents is referred by vascular surgery for evaluation of abnormality noted on the left tonsil on recent CT scan.  Patient recently underwent a CT scan that showed a 9 mm peripheral enhancing mass involving the left tonsil.  No adenopathy in the neck was noted.  ENT referral was recommended. Patient has complained of some difficulty swallowing and has had barium swallow and further evaluation with negative work-up.  She denies any sore throat..  Past Medical History:  Diagnosis Date  . Allergy   . Breast cancer, right ([place]) 2013   Lumpectomy with Mammosite radiation and Tamoxifen  . Complication of anesthesia   . Corn   . Family history of adverse reaction to anesthesia    sister - slow to wake  . Fibrocystic breast   . GERD (gastroesophageal reflux disease)   . Hyperlipidemia   . Osteoporosis   . Osteoporosis   . Personal history of radiation therapy   . PONV (postoperative nausea and vomiting)   . Thyroid disease   . Vertigo    long ago  . Wears dentures    full upper and lower  . Wears hearing aid    bilateral   Past Surgical History:  Procedure Laterality Date  . ABDOMINAL HYSTERECTOMY    . APPENDECTOMY    . BREAST BIOPSY Right 2013   Stereo- Positive  . BREAST CYST EXCISION Right n/a   cyst removed  . BREAST LUMPECTOMY Right    2013  . BUNIONECTOMY Bilateral 2004  . 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 CATARACT EXTRACTION W/PHACO Right 11/10/2014   Procedure: CATARACT EXTRACTION PHACO AND INTRAOCULAR LENS PLACEMENT ([facility]);  Surgeon: Leandrew Koyanagi, MD;  Location: [place];  Service: Ophthalmology;  Laterality: Right;  . CHOLECYSTECTOMY    . ESOPHAGOGASTRODUODENOSCOPY (EGD) WITH PROPOFOL N/A 04/23/2018   Procedure: ESOPHAGOGASTRODUODENOSCOPY (EGD) WITH PROPOFOL;  Surgeon: Virgel Manifold, MD;  Location: [facility];  Service: Endoscopy;  Laterality: N/A;  pt prefers later in am  . KNEE SURGERY  Right 2005  . Mastoid surgery Right   . SKIN CANCER EXCISION  03/17/2014   right side of face Dr. Aubery Lapping  . THYROID SURGERY N/A 1980   Goiter   Social History   Socioeconomic History  . Marital status: Married    Spouse name: Juanda Crumble  . Number of children: 2  . Years of education: Not on file  . Highest education level: 12th grade  Occupational History  . Occupation: retired  Tobacco Use  . Smoking status: Never Smoker  . Smokeless tobacco: Never Used  Substance and Sexual Activity  . Alcohol use: No  . Drug use: No  . Sexual activity: Not on file  Other Topics Concern  . Not on file  Social History Narrative  . Not on file   Social Determinants of Health   Financial Resource Strain:   . Difficulty of Paying Living Expenses: Not on file  Food Insecurity:   . Worried About Charity fundraiser in the Last Year: Not on file  . Ran Out of Food in the Last Year: Not on file  Transportation Needs:   . Lack of Transportation (Medical): Not on file  . Lack of Transportation (Non-Medical): Not on file  Physical Activity: Inactive  . Days of Exercise per Week: 0 days  . Minutes of Exercise per Session: 0 min  Stress: No Stress Concern Present  . Feeling of Stress : Only  a little  Social Connections: Unknown  . Frequency of Communication with Friends and Family: Patient refused  . Frequency of Social Gatherings with Friends and Family: Patient refused  . Attends Religious Services: Patient refused  . Active Member of Clubs or Organizations: Patient refused  . Attends Archivist Meetings: Patient refused  . Marital Status: Patient refused   Family History  Problem Relation Age of Onset  . Stroke Sister   . Breast cancer Sister 77  . Breast cancer Other   . Colon cancer Neg Hx    Allergies  Allergen Reactions  . Actonel [Risedronate]   . Boniva [Ibandronic Acid]     shakes  . Fosamax [Alendronate]     Patient reports medication caused her to severe  mood changes.   Prior to Admission medications   Medication Sig Start Date End Date Taking? Authorizing Provider  albuterol (VENTOLIN HFA) 108 (90 Base) MCG/ACT inhaler Inhale 2 puffs into the lungs every 4 (four) hours as needed for wheezing or shortness of breath. 09/17/18  Yes Flora Lipps, MD  aspirin EC 81 MG tablet Take 81 mg by mouth daily.   Yes [provider]  calcium carbonate (OS-CAL) 600 MG TABS tablet Take 600 mg by mouth daily with breakfast.  01/20/10  Yes [provider]  carbidopa-levodopa (SINEMET IR) 25-100 MG tablet Take 1.5 tablets by mouth 3 (three) times daily.  12/02/17  Yes [provider]  docusate sodium (COLACE) 100 MG capsule Take by mouth daily.   Yes [provider]  famotidine (PEPCID) 20 MG tablet Take 20 mg by mouth as needed.    Yes [provider]  ketoconazole (NIZORAL) 2 % cream Apply 1 application topically daily as needed.  08/07/15  Yes [provider]  levothyroxine (SYNTHROID) 50 MCG tablet TAKE 1 TABLET BY MOUTH EVERY DAY 01/07/19  Yes Bacigalupo, Dionne Bucy, MD  Multiple Vitamins-Minerals (PRESERVISION AREDS 2 PO) Take by mouth 2 (two) times daily.    Yes [provider]  Omega-3 Fatty Acids (FISH OIL DOUBLE STRENGTH) 1200 MG CAPS Take 1 capsule by mouth daily.  01/20/10  Yes [provider]  polyethylene glycol (MIRALAX / GLYCOLAX) packet Take 17 g by mouth daily as needed.    Yes [provider]  pravastatin (PRAVACHOL) 40 MG tablet TAKE 1 TABLET BY MOUTH EVERY DAY 06/02/18  Yes Bacigalupo, Dionne Bucy, MD  sertraline (ZOLOFT) 25 MG tablet Take 25 mg by mouth daily. 08/12/18  Yes [provider]  triamcinolone cream (KENALOG) 0.1 % APPLY TO AFFECTED AREA 3 TIMES A DAY FOR INSECT BITES Patient taking differently: Apply 1 application topically as needed.  12/06/16  Yes Birdie Sons, MD     Positive ROS: No hoarseness.  All other systems have been reviewed and were  otherwise negative with the exception of those mentioned in the HPI and as above.  Physical Exam: Constitutional: Alert, well-appearing, no acute distress. Ears: External ears without lesions or tenderness. Ear canals are clear bilaterally with intact, clear TMs.  Nasal: External nose without lesions. Septum relatively midline.. Clear nasal passages Oral: Lips and gums without lesions. Tongue and palate mucosa without lesions. Posterior oropharynx clear..  On close evaluation of the left tonsil patient has some slight left tonsil fullness but no mucosal abnormalities noted and on palpation the left tonsil is soft and this is consistent with probable cyst within the tonsil.  There is no induration and mucosal over the tonsils normal in appearance. Indirect  laryngoscopy revealed a clear base of tongue vallecula and epiglottis.  Inferior portion of the left tonsil was normal to evaluation. Neck: No palpable adenopathy or masses.  No palpable adenopathy in the left neck. Respiratory: Breathing comfortably  Skin: No facial/neck lesions or rash noted.  Procedures  Assessment: On clinical examination the left tonsil appears benign.  The 9 mm enhancing lesion of the left tonsil probably represents tonsillar cyst  Plan: Did not recommend any further evaluation of this.  Reassured Rebecca Patterson and her daughter concerning benign evaluation.   Radene Journey, MD   CC:

## 2019-01-23 ENCOUNTER — Other Ambulatory Visit: Payer: Self-pay

## 2019-01-23 NOTE — Patient Outreach (Signed)
[facility]) Care Management  01/23/2019  Rebecca Patterson Jul 03, 1934 [id number]   Medication Adherence call to [name] [facility] Identifiers Verify spoke with patient she is past due on Metformin 500 mg patient explain she is no longer taking this medication,she explain doctor took her off. [name] is showing past due under [place].   [place] Management Direct Dial [phone]  Fax [phone] [email or web address]

## 2019-01-27 ENCOUNTER — Encounter: Payer: Medicare Other | Admitting: Podiatry

## 2019-01-27 ENCOUNTER — Other Ambulatory Visit: Payer: Self-pay

## 2019-01-27 ENCOUNTER — Encounter: Payer: Self-pay | Admitting: Podiatry

## 2019-01-29 ENCOUNTER — Ambulatory Visit (INDEPENDENT_AMBULATORY_CARE_PROVIDER_SITE_OTHER): Payer: Medicare Other | Admitting: Otolaryngology

## 2019-02-03 DIAGNOSIS — M2011 Hallux valgus (acquired), right foot: Secondary | ICD-10-CM | POA: Diagnosis not present

## 2019-02-03 DIAGNOSIS — M2012 Hallux valgus (acquired), left foot: Secondary | ICD-10-CM | POA: Diagnosis not present

## 2019-02-05 ENCOUNTER — Ambulatory Visit (INDEPENDENT_AMBULATORY_CARE_PROVIDER_SITE_OTHER): Payer: Medicare Other | Admitting: Otolaryngology

## 2019-02-17 NOTE — Progress Notes (Signed)
This encounter was created in error - please disregard.

## 2019-03-12 ENCOUNTER — Encounter: Payer: Self-pay | Admitting: Family Medicine

## 2019-03-12 ENCOUNTER — Telehealth: Payer: Self-pay | Admitting: Family Medicine

## 2019-03-12 ENCOUNTER — Ambulatory Visit (INDEPENDENT_AMBULATORY_CARE_PROVIDER_SITE_OTHER): Payer: Medicare Other | Admitting: Family Medicine

## 2019-03-12 VITALS — BP 112/71 | HR 96 | Temp 97.3°F | Resp 16 | Wt 128.0 lb

## 2019-03-12 DIAGNOSIS — E785 Hyperlipidemia, unspecified: Secondary | ICD-10-CM

## 2019-03-12 DIAGNOSIS — E039 Hypothyroidism, unspecified: Secondary | ICD-10-CM | POA: Diagnosis not present

## 2019-03-12 DIAGNOSIS — R1312 Dysphagia, oropharyngeal phase: Secondary | ICD-10-CM

## 2019-03-12 DIAGNOSIS — E1169 Type 2 diabetes mellitus with other specified complication: Secondary | ICD-10-CM

## 2019-03-12 DIAGNOSIS — R918 Other nonspecific abnormal finding of lung field: Secondary | ICD-10-CM

## 2019-03-12 DIAGNOSIS — R634 Abnormal weight loss: Secondary | ICD-10-CM

## 2019-03-12 DIAGNOSIS — E119 Type 2 diabetes mellitus without complications: Secondary | ICD-10-CM

## 2019-03-12 DIAGNOSIS — R590 Localized enlarged lymph nodes: Secondary | ICD-10-CM

## 2019-03-12 LAB — POCT GLYCOSYLATED HEMOGLOBIN (HGB A1C)
Est. average glucose Bld gHb Est-mCnc: 131
Hemoglobin A1C: 6.2 % — AB (ref 4.0–5.6)

## 2019-03-12 NOTE — Assessment & Plan Note (Signed)
Continue pravastatin Goal LDL <70 Recheck FLP and CMP

## 2019-03-12 NOTE — Telephone Encounter (Signed)
Copied from [place] [id number]. Topic: General - Inquiry >> Mar 12, 2019  2:30 PM Percell Belt A wrote: Reason for CRM: pt called in states she was just in the office and states she was suppose to call back and let Dr B know if she wanted to CT scan ordered.  She stated she does and would like the appt made around 11am and across the street  Best number  [phone]

## 2019-03-12 NOTE — Assessment & Plan Note (Signed)
May have been related to tonsillar mass States it has resolved since ENT "Moved" the tonsil

## 2019-03-12 NOTE — Progress Notes (Signed)
Patient: Rebecca Patterson Female    DOB: 08-05-1934   85 y.o.   MRN: [id number] Visit Date: 03/12/2019  Today's Provider: Lavon Paganini, MD   Chief Complaint  Patient presents with  . Diabetes  . Hyperlipidemia  . Hypothyroidism   Subjective:    I [place] S. Dimas, CMA, am acting as scribe for Lavon Paganini, MD.  HPI  Diabetes Mellitus Type II, Follow-up:   Lab Results  Component Value Date   HGBA1C 6.2 (A) 03/12/2019   HGBA1C 6.1 (H) 09/09/2018   HGBA1C 6.1 (A) 03/19/2018    Last seen for diabetes 6 months ago.  Management since then includes stop metformin. She reports excellent compliance with treatment. She is not having side effects.  Current symptoms include none and have been stable. Home blood sugar records: not being checked  Episodes of hypoglycemia? no   Current insulin regiment: Is not on insulin Most Recent Eye Exam: UTD Weight trend: stable Prior visit with dietician: No Current exercise: walking Current diet habits: in general, a "healthy" diet    Pertinent Labs:    Component Value Date/Time   CHOL 175 09/09/2018 1128   TRIG 115 09/09/2018 1128   HDL 72 09/09/2018 1128   LDLCALC 80 09/09/2018 1128   CREATININE 0.70 12/29/2018 1103   CREATININE 0.85 11/04/2013 1029    Wt Readings from Last 3 Encounters:  03/12/19 128 lb (58.1 kg)  01/13/19 128 lb (58.1 kg)  12/12/18 126 lb (57.2 kg)    ------------------------------------------------------------------------  Hypertension, follow-up:  BP Readings from Last 3 Encounters:  03/12/19 112/71  01/13/19 117/77  12/12/18 108/68    She was last seen for hypertension 6 months ago.  BP at that visit was 122/66. Management changes since that visit include check labs. She reports excellent compliance with treatment. She is not having side effects.  She is exercising. She is adherent to low salt diet.   Outside blood pressures are not being checked. She is experiencing none.    Patient denies chest pain.   Cardiovascular risk factors include advanced age (older than 35 for men, 33 for women), diabetes mellitus and hypertension.  Use of agents associated with hypertension: none.     Weight trend: stable Wt Readings from Last 3 Encounters:  03/12/19 128 lb (58.1 kg)  01/13/19 128 lb (58.1 kg)  12/12/18 126 lb (57.2 kg)    Current diet: in general, a "healthy" diet    ------------------------------------------------------------------------   Hypothyroid, follow-up:  TSH  Date Value Ref Range Status  09/09/2018 0.614 0.450 - 4.500 uIU/mL Final  03/19/2018 0.518 0.450 - 4.500 uIU/mL Final  09/05/2017 1.540 0.450 - 4.500 uIU/mL Final   Wt Readings from Last 3 Encounters:  03/12/19 128 lb (58.1 kg)  01/13/19 128 lb (58.1 kg)  12/12/18 126 lb (57.2 kg)    She was last seen for hypothyroid 6 months ago.  Management since that visit includes check labs. She reports excellent compliance with treatment. She is not having side effects.  She is exercising. She is experiencing none She denies change in energy level, diarrhea and heat / cold intolerance Weight trend: stable  ------------------------------------------------------------------------   Allergies  Allergen Reactions  . Actonel [Risedronate]   . Boniva [Ibandronic Acid]     shakes  . Fosamax [Alendronate]     Patient reports medication caused her to severe mood changes.     Current Outpatient Medications:  .  albuterol (VENTOLIN HFA) 108 (90 Base) MCG/ACT inhaler,  Inhale 2 puffs into the lungs every 4 (four) hours as needed for wheezing or shortness of breath., Disp: 1 g, Rfl: 6 .  aspirin EC 81 MG tablet, Take 81 mg by mouth daily., Disp: , Rfl:  .  calcium carbonate (OS-CAL) 600 MG TABS tablet, Take 600 mg by mouth daily with breakfast. , Disp: , Rfl:  .  carbidopa-levodopa (SINEMET IR) 25-100 MG tablet, Take 1.5 tablets by mouth 3 (three) times daily. , Disp: , Rfl:  .  docusate  sodium (COLACE) 100 MG capsule, Take by mouth daily., Disp: , Rfl:  .  famotidine (PEPCID) 20 MG tablet, Take 20 mg by mouth as needed. , Disp: , Rfl:  .  ketoconazole (NIZORAL) 2 % cream, Apply 1 application topically daily as needed. , Disp: , Rfl:  .  levothyroxine (SYNTHROID) 50 MCG tablet, TAKE 1 TABLET BY MOUTH EVERY DAY, Disp: 90 tablet, Rfl: 3 .  Multiple Vitamins-Minerals (PRESERVISION AREDS 2 PO), Take by mouth 2 (two) times daily. , Disp: , Rfl:  .  Omega-3 Fatty Acids (FISH OIL DOUBLE STRENGTH) 1200 MG CAPS, Take 1 capsule by mouth daily. , Disp: , Rfl:  .  polyethylene glycol (MIRALAX / GLYCOLAX) packet, Take 17 g by mouth daily as needed. , Disp: , Rfl:  .  pravastatin (PRAVACHOL) 40 MG tablet, TAKE 1 TABLET BY MOUTH EVERY DAY, Disp: 90 tablet, Rfl: 4 .  sertraline (ZOLOFT) 25 MG tablet, Take 25 mg by mouth daily., Disp: , Rfl:  .  triamcinolone cream (KENALOG) 0.1 %, APPLY TO AFFECTED AREA 3 TIMES A DAY FOR INSECT BITES (Patient taking differently: Apply 1 application topically as needed. ), Disp: 60 g, Rfl: 5  Review of Systems  Constitutional: Negative.   Eyes: Negative.   Respiratory: Negative.   Cardiovascular: Negative.   Gastrointestinal: Negative.   Endocrine: Negative.     Social History   Tobacco Use  . Smoking status: Never Smoker  . Smokeless tobacco: Never Used  Substance Use Topics  . Alcohol use: No      Objective:   BP 112/71 (BP Location: Left Arm, Patient Position: Sitting, Cuff Size: Normal)   Pulse 96   Temp (!) 97.3 F (36.3 C) (Temporal)   Resp 16   Wt 128 lb (58.1 kg)   BMI 23.41 kg/m  Vitals:   03/12/19 1128  BP: 112/71  Pulse: 96  Resp: 16  Temp: (!) 97.3 F (36.3 C)  TempSrc: Temporal  Weight: 128 lb (58.1 kg)  Body mass index is 23.41 kg/m.   Physical Exam Vitals reviewed.  Constitutional:      General: She is not in acute distress.    Appearance: Normal appearance. She is well-developed. She is not diaphoretic.  HENT:       Head: Normocephalic and atraumatic.  Eyes:     General: No scleral icterus.    Conjunctiva/sclera: Conjunctivae normal.  Neck:     Thyroid: No thyromegaly.  Cardiovascular:     Rate and Rhythm: Normal rate and regular rhythm.     Pulses: Normal pulses.     Heart sounds: Normal heart sounds. No murmur.  Pulmonary:     Effort: Pulmonary effort is normal. No respiratory distress.     Breath sounds: Normal breath sounds. No wheezing, rhonchi or rales.  Musculoskeletal:     Cervical back: Neck supple.     Right lower leg: No edema.     Left lower leg: No edema.  Lymphadenopathy:  Cervical: No cervical adenopathy.  Skin:    General: Skin is warm and dry.     Capillary Refill: Capillary refill takes less than 2 seconds.     Findings: No rash.  Neurological:     Mental Status: She is alert and oriented to person, place, and time. Mental status is at baseline.  Psychiatric:        Mood and Affect: Mood normal.        Behavior: Behavior normal.      Results for orders placed or performed in visit on 03/12/19  POCT glycosylated hemoglobin (Hb A1C)  Result Value Ref Range   Hemoglobin A1C 6.2 (A) 4.0 - 5.6 %   Est. average glucose Bld gHb Est-mCnc 131        Assessment & Plan    Problem List Items Addressed This Visit      Digestive   Dysphagia    May have been related to tonsillar mass States it has resolved since ENT "Moved" the tonsil        Endocrine   T2DM (type 2 diabetes mellitus) ([place]) - Primary    Chronic and well-controlled Recheck urine microalbumin Up-to-date on foot exam and eye exam Discussed shingles vaccination and Tdap Encourage low-carb diet and exercise Currently diet controlled, on no medications Follow-up in 6 months      Relevant Orders   POCT glycosylated hemoglobin (Hb A1C) (Completed)   Hyperlipidemia associated with type 2 diabetes mellitus (HCC)    Continue pravastatin Goal LDL <70 Recheck FLP and CMP      Relevant Orders    Comprehensive metabolic panel   Lipid panel   Adult hypothyroidism    Previously well controlled Continue Synthroid at current dose  Recheck TSH and adjust Synthroid as indicated        Relevant Orders   TSH     Other   Unintentional weight loss    Weight loss has stabilized and appetite has improved Suspect that this is multifactorial and related to depression symptoms in the setting of Parkinson's disease as well as her other chronic medical conditions Continue Zoloft as prescribed by Neurology Repeat labs  Plan to repeat CT Chest in 09/2019 to f/u on nodules and mediastinal LAD (discussed repeating it now, but patient prefers to wait)      Relevant Orders   Comprehensive metabolic panel   Lipid panel   TSH       Return in about 6 months (around 09/09/2019) for CPE/AWV.   The entirety of the information documented in the History of Present Illness, Review of Systems and Physical Exam were personally obtained by me. Portions of this information were initially documented by Lynford Humphrey, CMA and reviewed by me for thoroughness and accuracy.    Caraline Deutschman, Dionne Bucy, MD MPH [facility]

## 2019-03-12 NOTE — Assessment & Plan Note (Addendum)
Weight loss has stabilized and appetite has improved Suspect that this is multifactorial and related to depression symptoms in the setting of Parkinson's disease as well as her other chronic medical conditions Continue Zoloft as prescribed by Neurology Repeat labs  Plan to repeat CT Chest in 09/2019 to f/u on nodules and mediastinal LAD (discussed repeating it now, but patient prefers to wait)

## 2019-03-12 NOTE — Assessment & Plan Note (Signed)
Chronic and well-controlled Recheck urine microalbumin Up-to-date on foot exam and eye exam Discussed shingles vaccination and Tdap Encourage low-carb diet and exercise Currently diet controlled, on no medications Follow-up in 6 months

## 2019-03-12 NOTE — Patient Instructions (Signed)
   The CDC recommends two doses of Shingrix (the shingles vaccine) separated by 2 to 6 months for adults age 83 years and older. I recommend checking with your insurance plan regarding coverage for this vaccine.   Also check with insurance about getting tetanus vaccine at the pharmacy

## 2019-03-12 NOTE — Assessment & Plan Note (Signed)
Previously well controlled Continue Synthroid at current dose  Recheck TSH and adjust Synthroid as indicated   

## 2019-03-13 LAB — COMPREHENSIVE METABOLIC PANEL
ALT: 4 IU/L (ref 0–32)
AST: 8 IU/L (ref 0–40)
Albumin/Globulin Ratio: 2.4 — ABNORMAL HIGH (ref 1.2–2.2)
Albumin: 4.6 g/dL (ref 3.6–4.6)
Alkaline Phosphatase: 69 IU/L (ref 39–117)
BUN/Creatinine Ratio: 20 (ref 12–28)
BUN: 16 mg/dL (ref 8–27)
Bilirubin Total: 0.6 mg/dL (ref 0.0–1.2)
CO2: 23 mmol/L (ref 20–29)
Calcium: 8.8 mg/dL (ref 8.7–10.3)
Chloride: 102 mmol/L (ref 96–106)
Creatinine, Ser: 0.79 mg/dL (ref 0.57–1.00)
GFR calc Af Amer: 81 mL/min/{1.73_m2} (ref >59)
GFR calc non Af Amer: 70 mL/min/{1.73_m2} (ref >59)
Globulin, Total: 1.9 g/dL (ref 1.5–4.5)
Glucose: 129 mg/dL — ABNORMAL HIGH (ref 65–99)
Potassium: 4 mmol/L (ref 3.5–5.2)
Sodium: 141 mmol/L (ref 134–144)
Total Protein: 6.5 g/dL (ref 6.0–8.5)

## 2019-03-13 LAB — LIPID PANEL
Chol/HDL Ratio: 2.5 ratio (ref 0.0–4.4)
Cholesterol, Total: 173 mg/dL (ref 100–199)
HDL: 68 mg/dL (ref >39)
LDL Chol Calc (NIH): 83 mg/dL (ref 0–99)
Triglycerides: 130 mg/dL (ref 0–149)
VLDL Cholesterol Cal: 22 mg/dL (ref 5–40)

## 2019-03-13 LAB — TSH: TSH: 0.884 u[IU]/mL (ref 0.450–4.500)

## 2019-03-17 ENCOUNTER — Telehealth: Payer: Self-pay

## 2019-03-17 NOTE — Telephone Encounter (Signed)
Pt advised.   Thanks,   -Bani Gianfrancesco  

## 2019-03-17 NOTE — Telephone Encounter (Signed)
-----   Message from Virginia Crews, MD sent at 03/17/2019  8:05 AM EST ----- Normal labs

## 2019-03-24 ENCOUNTER — Other Ambulatory Visit: Payer: Self-pay

## 2019-03-24 ENCOUNTER — Ambulatory Visit
Admission: RE | Admit: 2019-03-24 | Discharge: 2019-03-24 | Disposition: A | Discharge status: Home / Self Care | Payer: Medicare Other | Source: Ambulatory Visit | Attending: Family Medicine | Admitting: Family Medicine

## 2019-03-24 DIAGNOSIS — R918 Other nonspecific abnormal finding of lung field: Secondary | ICD-10-CM | POA: Diagnosis not present

## 2019-03-24 DIAGNOSIS — R590 Localized enlarged lymph nodes: Secondary | ICD-10-CM | POA: Insufficient documentation

## 2019-03-24 DIAGNOSIS — R911 Solitary pulmonary nodule: Secondary | ICD-10-CM | POA: Diagnosis not present

## 2019-03-25 ENCOUNTER — Telehealth: Payer: Self-pay

## 2019-03-25 NOTE — Telephone Encounter (Signed)
Attempted to call patient with results- left message on VM to return call

## 2019-03-25 NOTE — Telephone Encounter (Signed)
Left message to call back. Okay for PEC to advise patient.

## 2019-03-25 NOTE — Telephone Encounter (Signed)
Pt returned call and message given to her from her pcp regarding her CT scan. She voiced understanding.

## 2019-03-25 NOTE — Telephone Encounter (Signed)
-----   Message from Virginia Crews, MD sent at 03/25/2019 11:04 AM EST ----- Chest CT shows stable findings with no reason for repeat imaging.  Since everything is stable in size, this is indicative of a benign process

## 2019-03-25 NOTE — Telephone Encounter (Signed)
Attempted to call Patient not home. Left message for return call.

## 2019-03-27 ENCOUNTER — Telehealth: Payer: Self-pay

## 2019-03-27 NOTE — Telephone Encounter (Signed)
Copied from [place] [id number]. Topic: General - Other >> Mar 25, 2019  1:11 PM Antonieta Iba C wrote: Reason for CRM: pt is returning the office call for imaging results.

## 2019-03-27 NOTE — Telephone Encounter (Signed)
PEC advised patient.

## 2019-03-31 ENCOUNTER — Telehealth: Payer: Self-pay

## 2019-03-31 NOTE — Telephone Encounter (Signed)
Copied from [place] [id number]. Topic: General - Other >> Mar 31, 2019 11:26 AM Celene Kras wrote: Reason for CRM: Pt called stating that she contacted insurance for her shingles and tetanus injections. Pt provided number  [id number] to get this covered by insurance. Please advise.

## 2019-04-07 NOTE — Telephone Encounter (Signed)
Patient reports she will have vaccines at pharmacy.

## 2019-04-13 ENCOUNTER — Ambulatory Visit (INDEPENDENT_AMBULATORY_CARE_PROVIDER_SITE_OTHER): Payer: Medicare Other | Admitting: Family Medicine

## 2019-04-13 ENCOUNTER — Other Ambulatory Visit: Payer: Self-pay

## 2019-04-13 ENCOUNTER — Encounter: Payer: Self-pay | Admitting: Family Medicine

## 2019-04-13 DIAGNOSIS — Z23 Encounter for immunization: Secondary | ICD-10-CM | POA: Diagnosis not present

## 2019-04-13 NOTE — Progress Notes (Signed)
Patient here for Shingrix and Tetanus vaccination only.  I did not examine the patient.  I did review his medical history, medications, and allergies and vaccine consent form.  CMA gave vaccination. Patient tolerated well.  Virginia Crews, MD, MPH [facility] 04/13/2019 4:10 PM

## 2019-05-04 DIAGNOSIS — L570 Actinic keratosis: Secondary | ICD-10-CM | POA: Diagnosis not present

## 2019-05-04 DIAGNOSIS — L578 Other skin changes due to chronic exposure to nonionizing radiation: Secondary | ICD-10-CM | POA: Diagnosis not present

## 2019-05-04 DIAGNOSIS — Z872 Personal history of diseases of the skin and subcutaneous tissue: Secondary | ICD-10-CM | POA: Diagnosis not present

## 2019-05-04 DIAGNOSIS — Z85828 Personal history of other malignant neoplasm of skin: Secondary | ICD-10-CM | POA: Diagnosis not present

## 2019-05-04 DIAGNOSIS — Z859 Personal history of malignant neoplasm, unspecified: Secondary | ICD-10-CM | POA: Diagnosis not present

## 2019-06-04 ENCOUNTER — Other Ambulatory Visit: Payer: Self-pay | Admitting: Family Medicine

## 2019-06-15 ENCOUNTER — Encounter: Payer: Self-pay | Admitting: Family Medicine

## 2019-06-15 ENCOUNTER — Ambulatory Visit (INDEPENDENT_AMBULATORY_CARE_PROVIDER_SITE_OTHER): Payer: Medicare Other | Admitting: Family Medicine

## 2019-06-15 ENCOUNTER — Other Ambulatory Visit: Payer: Self-pay

## 2019-06-15 DIAGNOSIS — Z23 Encounter for immunization: Secondary | ICD-10-CM | POA: Diagnosis not present

## 2019-06-15 NOTE — Progress Notes (Signed)
Patient here for Shingrix vaccination only.  I did not examine the patient.  I did review his medical history, medications, and allergies and vaccine consent form.  CMA gave vaccination. Patient tolerated well.  Virginia Crews, MD, MPH [facility] 06/15/2019 12:36 PM

## 2019-06-16 ENCOUNTER — Encounter: Payer: Self-pay | Admitting: Internal Medicine

## 2019-06-16 ENCOUNTER — Inpatient Hospital Stay: Payer: Medicare Other | Attending: Internal Medicine

## 2019-06-16 ENCOUNTER — Inpatient Hospital Stay (HOSPITAL_BASED_OUTPATIENT_CLINIC_OR_DEPARTMENT_OTHER): Payer: Medicare Other | Admitting: Internal Medicine

## 2019-06-16 VITALS — BP 102/64 | HR 70 | Temp 97.2°F | Resp 16 | Wt 133.0 lb

## 2019-06-16 DIAGNOSIS — C50811 Malignant neoplasm of overlapping sites of right female breast: Secondary | ICD-10-CM

## 2019-06-16 DIAGNOSIS — M810 Age-related osteoporosis without current pathological fracture: Secondary | ICD-10-CM

## 2019-06-16 DIAGNOSIS — G20 Parkinson's disease: Secondary | ICD-10-CM | POA: Diagnosis not present

## 2019-06-16 DIAGNOSIS — Z170 Estrogen receptor positive status [ER+]: Secondary | ICD-10-CM

## 2019-06-16 DIAGNOSIS — Z853 Personal history of malignant neoplasm of breast: Secondary | ICD-10-CM | POA: Insufficient documentation

## 2019-06-16 DIAGNOSIS — R1310 Dysphagia, unspecified: Secondary | ICD-10-CM | POA: Diagnosis not present

## 2019-06-16 DIAGNOSIS — Z803 Family history of malignant neoplasm of breast: Secondary | ICD-10-CM | POA: Insufficient documentation

## 2019-06-16 DIAGNOSIS — R0600 Dyspnea, unspecified: Secondary | ICD-10-CM | POA: Diagnosis not present

## 2019-06-16 LAB — COMPREHENSIVE METABOLIC PANEL
ALT: 6 U/L (ref 0–44)
AST: 13 U/L — ABNORMAL LOW (ref 15–41)
Albumin: 4.6 g/dL (ref 3.5–5.0)
Alkaline Phosphatase: 74 U/L (ref 38–126)
Anion gap: 8 (ref 5–15)
BUN: 16 mg/dL (ref 8–23)
CO2: 29 mmol/L (ref 22–32)
Calcium: 8.7 mg/dL — ABNORMAL LOW (ref 8.9–10.3)
Chloride: 103 mmol/L (ref 98–111)
Creatinine, Ser: 0.66 mg/dL (ref 0.44–1.00)
GFR calc Af Amer: >60 mL/min (ref >60)
GFR calc non Af Amer: >60 mL/min (ref >60)
Glucose, Bld: 123 mg/dL — ABNORMAL HIGH (ref 70–99)
Potassium: 4.2 mmol/L (ref 3.5–5.1)
Sodium: 140 mmol/L (ref 135–145)
Total Bilirubin: 1 mg/dL (ref 0.3–1.2)
Total Protein: 7.1 g/dL (ref 6.5–8.1)

## 2019-06-16 LAB — CBC WITH DIFFERENTIAL/PLATELET
Abs Immature Granulocytes: 0.02 10*3/uL (ref 0.00–0.07)
Basophils Absolute: 0 10*3/uL (ref 0.0–0.1)
Basophils Relative: 1 %
Eosinophils Absolute: 0.1 10*3/uL (ref 0.0–0.5)
Eosinophils Relative: 1 %
HCT: 38.8 % (ref 36.0–46.0)
Hemoglobin: 13 g/dL (ref 12.0–15.0)
Immature Granulocytes: 0 %
Lymphocytes Relative: 18 %
Lymphs Abs: 1 10*3/uL (ref 0.7–4.0)
MCH: 31 pg (ref 26.0–34.0)
MCHC: 33.5 g/dL (ref 30.0–36.0)
MCV: 92.6 fL (ref 80.0–100.0)
Monocytes Absolute: 0.6 10*3/uL (ref 0.1–1.0)
Monocytes Relative: 11 %
Neutro Abs: 4 10*3/uL (ref 1.7–7.7)
Neutrophils Relative %: 69 %
Platelets: 172 10*3/uL (ref 150–400)
RBC: 4.19 MIL/uL (ref 3.87–5.11)
RDW: 13.1 % (ref 11.5–15.5)
WBC: 5.7 10*3/uL (ref 4.0–10.5)
nRBC: 0 % (ref 0.0–0.2)

## 2019-06-16 NOTE — Progress Notes (Signed)
[facility] OFFICE PROGRESS NOTE  Patient Care Team: Brita Romp Dionne Bucy, MD as PCP - General (Family Medicine) Leandrew Koyanagi, MD as Referring Physician (Ophthalmology) Cammie Sickle, MD as Consulting Physician (Internal Medicine) Isaias Cowman, MD as Consulting Physician (Cardiology) Flora Lipps, MD as Consulting Physician (Pulmonary Disease)  Cancer Staging No matching staging information was found for the patient.   Oncology History Overview Note  # 2013- Stage IA (pT1b pN0 (sn) cM0) grade 1 invasive mammary carcinoma of the right breast status post wide local excision and sentinel node biopsy on 04/05/11.; Tumor size 9 mm, grade 1, margins uninvolved by invasive carcinoma.  No DCIS.  1 sentinel lymph node negative;  ER/PR positive (90%),  HER-2/neu negative by FISH (HER2/CEP17 ratio 1.04). S/p RT; no chemo.   # Patient started anastrozole March 2013, changed to exemestane May 2013 due to side effects [until May 2018]  # OSTEOPOROSIS- Feb 2019- ca+vit D: Intol to oral Bisphosphonates; Reluctant to reclast/Prolia.   DIAGNOSIS: breast cancer  STAGE:   I      ;GOALS: cure  CURRENT/MOST RECENT THERAPY: surveillaince    Carcinoma of overlapping sites of right breast in female, estrogen receptor positive ([place])      INTERVAL HISTORY:  Rebecca Patterson 85 y.o.  female pleasant patient above history of stage I breast cancer ER PR positive HER-2/neu negative-status post aromatase inhibitor; finished May 2018 is here for follow-up.  Patient denies any new lumps or bumps.  Appetite is good.  No weight loss.  No nausea no vomiting.  No worsening bone pain.   Review of Systems  Constitutional: Negative for chills, diaphoresis, fever, malaise/fatigue and weight loss.  HENT: Negative for nosebleeds and sore throat.   Eyes: Negative for double vision.  Respiratory: Negative for cough, hemoptysis, sputum production, shortness of breath and wheezing.    Cardiovascular: Negative for chest pain, palpitations, orthopnea and leg swelling.  Gastrointestinal: Negative for abdominal pain, blood in stool, constipation, diarrhea, heartburn, melena, nausea and vomiting.  Genitourinary: Negative for dysuria, frequency and urgency.  Musculoskeletal: Negative for back pain and joint pain.  Skin: Negative.  Negative for itching and rash.  Neurological: Negative for dizziness, tingling, focal weakness, weakness and headaches.  Endo/Heme/Allergies: Does not bruise/bleed easily.  Psychiatric/Behavioral: Negative for depression. The patient is not nervous/anxious and does not have insomnia.       PAST MEDICAL HISTORY :  Past Medical History:  Diagnosis Date  . Allergy   . Breast cancer, right ([place]) 2013   Lumpectomy with Mammosite radiation and Tamoxifen  . Complication of anesthesia   . Corn   . Family history of adverse reaction to anesthesia    sister - slow to wake  . Fibrocystic breast   . GERD (gastroesophageal reflux disease)   . 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 Hyperlipidemia   . Osteoporosis   . Osteoporosis   . Personal history of radiation therapy   . PONV (postoperative nausea and vomiting)   . Thyroid disease   . Vertigo    long ago  . Wears dentures    full upper and lower  . Wears hearing aid    bilateral    PAST SURGICAL HISTORY :   Past Surgical History:  Procedure Laterality Date  . ABDOMINAL HYSTERECTOMY    . APPENDECTOMY    . BREAST BIOPSY Right 2013   Stereo- Positive  . BREAST CYST EXCISION Right n/a   cyst removed  . BREAST LUMPECTOMY Right  2013  . BUNIONECTOMY Bilateral 2004  . 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 CATARACT EXTRACTION W/PHACO Right 11/10/2014   Procedure: CATARACT EXTRACTION PHACO AND INTRAOCULAR LENS PLACEMENT (IOC);  Surgeon: Leandrew Koyanagi, MD;  Location: [facility];  Service: Ophthalmology;  Laterality: Right;  . CHOLECYSTECTOMY    .  ESOPHAGOGASTRODUODENOSCOPY (EGD) WITH PROPOFOL N/A 04/23/2018   Procedure: ESOPHAGOGASTRODUODENOSCOPY (EGD) WITH PROPOFOL;  Surgeon: Virgel Manifold, MD;  Location: [facility] END[facility];  Service: Endoscopy;  Laterality: N/A;  pt prefers later in am  . KNEE SURGERY Right 2005  . Mastoid surgery Right   . SKIN CANCER EXCISION  03/17/2014   right side of face Dr. Aubery Lapping  . THYROID SURGERY N/A 1980   Goiter    FAMILY HISTORY :   Family History  Problem Relation Age of Onset  . Stroke Sister   . Breast cancer Sister 68  . Breast cancer Other   . Colon cancer Neg Hx     SOCIAL HISTORY:   Social History   Tobacco Use  . Smoking status: Never Smoker  . Smokeless tobacco: Never Used  Substance Use Topics  . Alcohol use: No  . Drug use: No    ALLERGIES:  is allergic to actonel [risedronate]; boniva [ibandronic acid]; and fosamax [alendronate].  MEDICATIONS:  Current Outpatient Medications  Medication Sig Dispense Refill  . albuterol (VENTOLIN HFA) 108 (90 Base) MCG/ACT inhaler Inhale 2 puffs into the lungs every 4 (four) hours as needed for wheezing or shortness of breath. 1 g 6  . aspirin EC 81 MG tablet Take 81 mg by mouth daily.    . calcium carbonate (OS-CAL) 600 MG TABS tablet Take 600 mg by mouth daily with breakfast.     . carbidopa-levodopa (SINEMET IR) 25-100 MG tablet Take 1.5 tablets by mouth 3 (three) times daily.     Marland Kitchen docusate sodium (COLACE) 100 MG capsule Take by mouth daily.    . famotidine (PEPCID) 20 MG tablet Take 20 mg by mouth as needed.     Marland Kitchen ketoconazole (NIZORAL) 2 % cream Apply 1 application topically daily as needed.     Marland Kitchen levothyroxine (SYNTHROID) 50 MCG tablet TAKE 1 TABLET BY MOUTH EVERY DAY 90 tablet 3  . polyethylene glycol (MIRALAX / GLYCOLAX) packet Take 17 g by mouth daily as needed.     . pravastatin (PRAVACHOL) 40 MG tablet TAKE 1 TABLET BY MOUTH EVERY DAY 90 tablet 2  . sertraline (ZOLOFT) 25 MG tablet Take 25 mg by mouth daily.    Marland Kitchen  triamcinolone cream (KENALOG) 0.1 % APPLY TO AFFECTED AREA 3 TIMES A DAY FOR INSECT BITES (Patient taking differently: Apply 1 application topically as needed. ) 60 g 5   No current facility-administered medications for this visit.    PHYSICAL EXAMINATION: ECOG PERFORMANCE STATUS: 0 - Asymptomatic  BP 102/64 (BP Location: Left Arm, Patient Position: Sitting)   Pulse 70   Temp (!) 97.2 F (36.2 C) (Tympanic)   Resp 16   Wt 133 lb (60.3 kg)   SpO2 98%   BMI 24.33 kg/m   Filed Weights   06/16/19 1405  Weight: 133 lb (60.3 kg)    Physical Exam  Constitutional: She is oriented to person, place, and time and well-developed, well-nourished, and in no distress.  HENT:  Head: Normocephalic and atraumatic.  Mouth/Throat: Oropharynx is clear and moist. No oropharyngeal exudate.  Eyes: Pupils are equal, round, and reactive to light.  Cardiovascular: Normal rate and regular rhythm.  Pulmonary/Chest: No respiratory distress. She has no wheezes.  Abdominal: Soft. Bowel sounds are normal. She exhibits no distension and no mass. There is no abdominal tenderness. There is no rebound and no guarding.  Musculoskeletal:        General: No tenderness or edema. Normal range of motion.     Cervical back: Normal range of motion and neck supple.  Neurological: She is alert and oriented to person, place, and time.  Skin: Skin is warm.  Right and left BREAST exam (in the presence of nurse)- no unusual skin changes or dominant masses felt. Surgical scars noted.   Psychiatric: Affect normal.     LABORATORY DATA:  I have reviewed the data as listed    Component Value Date/Time   NA 140 06/16/2019 1324   NA 141 03/12/2019 1204   K 4.2 06/16/2019 1324   CL 103 06/16/2019 1324   CO2 29 06/16/2019 1324   GLUCOSE 123 (H) 06/16/2019 1324   BUN 16 06/16/2019 1324   BUN 16 03/12/2019 1204   CREATININE 0.66 06/16/2019 1324   CREATININE 0.85 11/04/2013 1029   CALCIUM 8.7 (L) 06/16/2019 1324   PROT  7.1 06/16/2019 1324   PROT 6.5 03/12/2019 1204   PROT 6.9 11/04/2013 1029   ALBUMIN 4.6 06/16/2019 1324   ALBUMIN 4.6 03/12/2019 1204   ALBUMIN 3.9 11/04/2013 1029   AST 13 (L) 06/16/2019 1324   AST 9 (L) 11/04/2013 1029   ALT 6 06/16/2019 1324   ALT 29 11/04/2013 1029   ALKPHOS 74 06/16/2019 1324   ALKPHOS 77 11/04/2013 1029   BILITOT 1.0 06/16/2019 1324   BILITOT 0.6 03/12/2019 1204   BILITOT 0.3 11/04/2013 1029   GFRNONAA >60 06/16/2019 1324   GFRNONAA >60 11/04/2013 1029   GFRNONAA >60 11/03/2012 1106   GFRAA >60 06/16/2019 1324   GFRAA >60 11/04/2013 1029   GFRAA >60 11/03/2012 1106    No results found for: SPEP, UPEP  Lab Results  Component Value Date   WBC 5.7 06/16/2019   NEUTROABS 4.0 06/16/2019   HGB 13.0 06/16/2019   HCT 38.8 06/16/2019   MCV 92.6 06/16/2019   PLT 172 06/16/2019      Chemistry      Component Value Date/Time   NA 140 06/16/2019 1324   NA 141 03/12/2019 1204   K 4.2 06/16/2019 1324   CL 103 06/16/2019 1324   CO2 29 06/16/2019 1324   BUN 16 06/16/2019 1324   BUN 16 03/12/2019 1204   CREATININE 0.66 06/16/2019 1324   CREATININE 0.85 11/04/2013 1029   GLU 101 07/01/2013 0000      Component Value Date/Time   CALCIUM 8.7 (L) 06/16/2019 1324   ALKPHOS 74 06/16/2019 1324   ALKPHOS 77 11/04/2013 1029   AST 13 (L) 06/16/2019 1324   AST 9 (L) 11/04/2013 1029   ALT 6 06/16/2019 1324   ALT 29 11/04/2013 1029   BILITOT 1.0 06/16/2019 1324   BILITOT 0.6 03/12/2019 1204   BILITOT 0.3 11/04/2013 1029       RADIOGRAPHIC STUDIES: I have personally reviewed the radiological images as listed and agreed with the findings in the report. No results found.   ASSESSMENT & PLAN:  Carcinoma of overlapping sites of right breast in female, estrogen receptor positive ([place]) #Stage I breast cancer-status post adjuvant Aromasin; finished May 2018; stable.  #No evidence of recurrence.  Continue surveillance.  #BMD jan 2019 with osteoporosis --2.6;  discussed re: risks/of progressive worsening of her osteoporosis.  Patient previously intolerant to oral bisphosphonates. Continue calcium vitamin D. Discussed re: reclast; wants to think; will order BMD in June 2021.    # DISPOSITION: wil call with results # follow up in 12 months follow up/labs-cbc/cmp-Dr.B #mammogram/BMD in June 2021-dr.B     Orders Placed This Encounter  Procedures  . DG Bone Density    Standing Status:   Future    Standing Expiration Date:   06/15/2020    Order Specific Question:   Reason for Exam (SYMPTOM  OR DIAGNOSIS REQUIRED)    Answer:   osteoporosis    Order Specific Question:   Preferred imaging location?    Answer:   [facility]  . MM 3D SCREEN BREAST BILATERAL    Standing Status:   Future    Standing Expiration Date:   08/15/2020    Order Specific Question:   Reason for Exam (SYMPTOM  OR DIAGNOSIS REQUIRED)    Answer:   history of breast cancer    Order Specific Question:   Preferred imaging location?    Answer:   [facility]  . CBC with Differential    Standing Status:   Future    Standing Expiration Date:   12/16/2020  . Comprehensive metabolic panel    Standing Status:   Future    Standing Expiration Date:   12/16/2020   All questions were answered. The patient knows to call the clinic with any problems, questions or concerns.      Cammie Sickle, MD 07/01/2019 4:44 PM

## 2019-06-16 NOTE — Progress Notes (Signed)
Pt in for follow up, denies any difficulties or concerns today. 

## 2019-06-16 NOTE — Assessment & Plan Note (Addendum)
#  Stage I breast cancer-status post adjuvant Aromasin; finished May 2018; stable.  #No evidence of recurrence.  Continue surveillance.  #BMD jan 2019 with osteoporosis --2.6; discussed re: risks/of progressive worsening of her osteoporosis.  Patient previously intolerant to oral bisphosphonates. Continue calcium vitamin D. Discussed re: reclast; wants to think; will order BMD in June 2021.    # DISPOSITION: wil call with results # follow up in 12 months follow up/labs-cbc/cmp-Dr.B #mammogram/BMD in June 2021-dr.B

## 2019-08-05 ENCOUNTER — Ambulatory Visit
Admission: RE | Admit: 2019-08-05 | Discharge: 2019-08-05 | Disposition: A | Discharge status: Home / Self Care | Payer: Medicare Other | Source: Ambulatory Visit | Attending: Internal Medicine | Admitting: Internal Medicine

## 2019-08-05 DIAGNOSIS — Z1231 Encounter for screening mammogram for malignant neoplasm of breast: Secondary | ICD-10-CM | POA: Diagnosis not present

## 2019-08-05 DIAGNOSIS — Z853 Personal history of malignant neoplasm of breast: Secondary | ICD-10-CM | POA: Diagnosis not present

## 2019-08-05 DIAGNOSIS — M8589 Other specified disorders of bone density and structure, multiple sites: Secondary | ICD-10-CM | POA: Diagnosis not present

## 2019-08-05 DIAGNOSIS — R29890 Loss of height: Secondary | ICD-10-CM | POA: Diagnosis not present

## 2019-08-05 DIAGNOSIS — M810 Age-related osteoporosis without current pathological fracture: Secondary | ICD-10-CM | POA: Diagnosis present

## 2019-08-05 DIAGNOSIS — Z170 Estrogen receptor positive status [ER+]: Secondary | ICD-10-CM

## 2019-08-05 DIAGNOSIS — C50811 Malignant neoplasm of overlapping sites of right female breast: Secondary | ICD-10-CM

## 2019-08-07 DIAGNOSIS — H353132 Nonexudative age-related macular degeneration, bilateral, intermediate dry stage: Secondary | ICD-10-CM | POA: Diagnosis not present

## 2019-08-07 DIAGNOSIS — E119 Type 2 diabetes mellitus without complications: Secondary | ICD-10-CM | POA: Diagnosis not present

## 2019-08-07 LAB — HM DIABETES EYE EXAM

## 2019-08-13 ENCOUNTER — Encounter: Payer: Self-pay | Admitting: Family Medicine

## 2019-09-01 ENCOUNTER — Telehealth: Payer: Self-pay | Admitting: Internal Medicine

## 2019-09-01 NOTE — Telephone Encounter (Signed)
On 7/27-left voicemail for the patient that bone density is improved.  Continue current therapy.  No new recommendations; follow-up as planned  FYI

## 2019-09-10 NOTE — Progress Notes (Addendum)
Subjective:   Rebecca Patterson is a 85 y.o. female who presents for Medicare Annual (Subsequent) preventive examination.  Review of Systems    N/A  Cardiac Risk Factors include: advanced age (>64men, >72 women);diabetes mellitus;dyslipidemia     Objective:    Today's Vitals   09/14/19 1043  BP: 122/76  Pulse: 82  Temp: 98.6 F (37 C)  TempSrc: Oral  SpO2: 97%  Weight: 131 lb 3.2 oz (59.5 kg)  Height: 5' (1.524 m)  PainSc: 0-No pain   Body mass index is 25.62 kg/m.  Advanced Directives 09/14/2019 06/16/2019 09/09/2018 06/16/2018 04/23/2018 09/05/2017 06/14/2017  Does Patient Have a Medical Advance Directive? Yes Yes Yes No Yes Yes Yes  Type of Paramedic of [place];Living will [place];Living will [place];Living will - [place];Living will [place];Living will Living will;Healthcare Power of Attorney  Does patient want to make changes to medical advance directive? - - - - - - No - Patient declined  Copy of [place] in Chart? Yes - validated most recent copy scanned in chart (See row information) - Yes - validated most recent copy scanned in chart (See row information) - No - copy requested No - copy requested No - copy requested  Would patient like information on creating a medical advance directive? - - - No - Patient declined - - -    Current Medications (verified) Outpatient Encounter Medications as of 09/14/2019  Medication Sig  . albuterol (VENTOLIN HFA) 108 (90 Base) MCG/ACT inhaler Inhale 2 puffs into the lungs every 4 (four) hours as needed for wheezing or shortness of breath.  Marland Kitchen aspirin EC 81 MG tablet Take 81 mg by mouth daily.  . calcium carbonate (OS-CAL) 600 MG TABS tablet Take 600 mg by mouth daily with breakfast.   . carbidopa-levodopa (SINEMET IR) 25-100 MG tablet Take 1.5 tablets by mouth 3 (three) times daily.   Marland Kitchen docusate sodium (COLACE) 100 MG  capsule Take by mouth daily.  . famotidine (PEPCID) 20 MG tablet Take 20 mg by mouth as needed.   Marland Kitchen ketoconazole (NIZORAL) 2 % cream Apply 1 application topically daily as needed.   Marland Kitchen levothyroxine (SYNTHROID) 50 MCG tablet TAKE 1 TABLET BY MOUTH EVERY DAY  . Multiple Vitamins-Minerals (PRESERVISION AREDS 2 PO) Take 1 capsule by mouth 2 (two) times daily.  . polyethylene glycol (MIRALAX / GLYCOLAX) packet Take 17 g by mouth daily as needed.   . pravastatin (PRAVACHOL) 40 MG tablet TAKE 1 TABLET BY MOUTH EVERY DAY  . sertraline (ZOLOFT) 25 MG tablet Take 25 mg by mouth daily.  Marland Kitchen triamcinolone cream (KENALOG) 0.1 % APPLY TO AFFECTED AREA 3 TIMES A DAY FOR INSECT BITES (Patient taking differently: Apply 1 application topically as needed. )   No facility-administered encounter medications on file as of 09/14/2019.    Allergies (verified) Actonel [risedronate], Boniva [ibandronic acid], and Fosamax [alendronate]   History: Past Medical History:  Diagnosis Date  . Allergy   . Breast cancer, right ([place]) 2013   Lumpectomy with Mammosite radiation and Tamoxifen  . Complication of anesthesia   . Corn   . Family history of adverse reaction to anesthesia    sister - slow to wake  . Fibrocystic breast   . GERD (gastroesophageal reflux disease)   . 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 Hyperlipidemia   . Osteoporosis   . Osteoporosis   . Personal history of  radiation therapy   . PONV (postoperative nausea and vomiting)   . Thyroid disease   . Vertigo    long ago  . Wears dentures    full upper and lower  . Wears hearing aid    bilateral   Past Surgical History:  Procedure Laterality Date  . ABDOMINAL HYSTERECTOMY    . APPENDECTOMY    . BREAST BIOPSY Right 2013   Stereo- Positive  . BREAST CYST EXCISION Right n/a   cyst removed  . BREAST LUMPECTOMY Right    2013  . BUNIONECTOMY Bilateral 2004  . 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 CATARACT EXTRACTION W/PHACO Right  11/10/2014   Procedure: CATARACT EXTRACTION PHACO AND INTRAOCULAR LENS PLACEMENT (IOC);  Surgeon: Leandrew Koyanagi, MD;  Location: [facility];  Service: Ophthalmology;  Laterality: Right;  . CHOLECYSTECTOMY    . ESOPHAGOGASTRODUODENOSCOPY (EGD) WITH PROPOFOL N/A 04/23/2018   Procedure: ESOPHAGOGASTRODUODENOSCOPY (EGD) WITH PROPOFOL;  Surgeon: Virgel Manifold, MD;  Location: [facility] END[facility];  Service: Endoscopy;  Laterality: N/A;  pt prefers later in am  . KNEE SURGERY Right 2005  . Mastoid surgery Right   . SKIN CANCER EXCISION  03/17/2014   right side of face Dr. Aubery Lapping  . THYROID SURGERY N/A 1980   Goiter   Family History  Problem Relation Age of Onset  . Stroke Sister   . Breast cancer Sister 66  . Breast cancer Other   . Colon cancer Neg Hx    Social History   Socioeconomic History  . Marital status: Married    Spouse name: Juanda Crumble  . Number of children: 2  . Years of education: Not on file  . Highest education level: 12th grade  Occupational History  . Occupation: retired  Tobacco Use  . Smoking status: Never Smoker  . Smokeless tobacco: Never Used  Vaping Use  . Vaping Use: Never used  Substance and Sexual Activity  . Alcohol use: No  . Drug use: No  . Sexual activity: Not on file  Other Topics Concern  . Not on file  Social History Narrative  . Not on file   Social Determinants of Health   Financial Resource Strain: Low Risk   . Difficulty of Paying Living Expenses: Not hard at all  Food Insecurity: No Food Insecurity  . Worried About Charity fundraiser in the Last Year: Never true  . Ran Out of Food in the Last Year: Never true  Transportation Needs: No Transportation Needs  . Lack of Transportation (Medical): No  . Lack of Transportation (Non-Medical): No  Physical Activity: Inactive  . Days of Exercise per Week: 0 days  . Minutes of Exercise per Session: 0 min  Stress: No Stress Concern Present  . Feeling of Stress : Not at all    Social Connections: Moderately Isolated  . Frequency of Communication with Friends and Family: More than three times a week  . Frequency of Social Gatherings with Friends and Family: More than three times a week  . Attends Religious Services: Never  . Active Member of Clubs or Organizations: No  . Attends Archivist Meetings: Never  . Marital Status: Married    Tobacco Counseling Counseling given: Not Answered   Clinical Intake:  Pre-visit preparation completed: Yes  Pain : No/denies pain Pain Score: 0-No pain     Nutritional Risks: None Diabetes: Yes  How often do you need to have someone help you when you read instructions, pamphlets, or other written  materials from your doctor or pharmacy?: 1 - Never  Diabetic? Yes, borderline  Nutrition Risk Assessment:  Has the patient had any N/V/D within the last 2 months?  No  Does the patient have any non-healing wounds?  No  Has the patient had any unintentional weight loss or weight gain?  No   Diabetes:  Is the patient diabetic?  Yes  If diabetic, was a CBG obtained today?  No  Did the patient bring in their glucometer from home?  No  How often do you monitor your CBG's? Not at all.   Financial Strains and Diabetes Management:  Are you having any financial strains with the device, your supplies or your medication? No .  Does the patient want to be seen by Chronic Care Management for management of their diabetes?  No  Would the patient like to be referred to a Nutritionist or for Diabetic Management?  No   Diabetic Exams:  Diabetic Eye Exam: Completed 08/07/19. Diabetic Foot Exam: Completed 02/04/19   Interpreter Needed?: No  Information entered by :: [facility], LPN   Activities of Daily Living In your present state of health, do you have any difficulty performing the following activities: 09/14/2019  Hearing? Y  Comment Wears bilateral hearing aids.  Vision? N  Comment Wears eye glasses.  Difficulty  concentrating or making decisions? N  Walking or climbing stairs? N  Dressing or bathing? N  Doing errands, shopping? N  Preparing Food and eating ? N  Using the Toilet? N  In the past six months, have you accidently leaked urine? N  Do you have problems with loss of bowel control? N  Managing your Medications? N  Managing your Finances? N  Some recent data might be hidden    Patient Care Team: Virginia Crews, MD as PCP - General (Family Medicine) Leandrew Koyanagi, MD as Referring Physician (Ophthalmology) Cammie Sickle, MD as Consulting Physician (Internal Medicine) Isaias Cowman, MD as Consulting Physician (Cardiology) Flora Lipps, MD as Consulting Physician (Pulmonary Disease) Vertell Limber (Neurology) Vladimir Crofts, MD as Consulting Physician (Neurology)  Indicate any recent Medical Services you may have received from other than [facility] providers in the past year (date may be approximate).     Assessment:   This is a routine wellness examination for [facility].  Hearing/Vision screen No exam data present  Dietary issues and exercise activities discussed: Current Exercise Habits: The patient does not participate in regular exercise at present, Exercise limited by: None identified  Goals    . Exercise 150 minutes per week (moderate activity)    . water intake     Continue drinking 6-8 glasses of water a day.       Depression Screen PHQ 2/9 Scores 09/14/2019 09/09/2018 09/05/2017 09/05/2017 08/29/2016 08/29/2016 08/18/2015  PHQ - 2 Score 0 0 0 0 0 0 0  PHQ- 9 Score - - 0 - 0 - -    Fall Risk Fall Risk  09/14/2019 09/09/2018 09/05/2017 08/29/2016 08/18/2015  Falls in the past year? 0 0 No No No  Number falls in past yr: 0 - - - -  Injury with Fall? 0 - - - -    Any stairs in or around the home? No  If so, are there any without handrails? No  Home free of loose throw rugs in walkways, pet beds, electrical cords, etc? Yes  Adequate lighting in your home  to reduce risk of falls? Yes   ASSISTIVE DEVICES UTILIZED TO  PREVENT FALLS:  Life alert? No  Use of a cane, walker or w/c? No  Grab bars in the bathroom? Yes  Shower chair or bench in shower? No  Elevated toilet seat or a handicapped toilet? No   TIMED UP AND GO:  Was the test performed? Yes .  Length of time to ambulate 10 feet: 10 sec.   Gait slow and steady without use of assistive device  Cognitive Function: Declined today.     6CIT Screen 08/29/2016  What Year? 0 points  What month? 0 points  What time? 0 points  Count back from 20 0 points  Months in reverse 2 points  Repeat phrase 0 points  Total Score 2    Immunizations Immunization History  Administered Date(s) Administered  . Fluad Quad(high Dose 65+) 11/04/2018  . Influenza, High Dose Seasonal PF 11/11/2015, 10/10/2016, 12/09/2017  . Influenza,inj,Quad PF,6+ Mos 02/18/2015  . Influenza-Unspecified 10/06/2013  . Pneumococcal Conjugate-13 10/21/2013  . Pneumococcal Polysaccharide-23 01/20/2010  . Td 09/03/2005  . Tdap 04/13/2019  . Zoster Recombinat (Shingrix) 04/13/2019, 06/15/2019    TDAP status: Up to date Flu Vaccine status: Up to date Pneumococcal vaccine status: Up to date Covid-19 vaccine status: Declined, Education has been provided regarding the importance of this vaccine but patient still declined. Advised may receive this vaccine at local pharmacy or Health Dept.or vaccine clinic. Aware to provide a copy of the vaccination record if obtained from local pharmacy or Health Dept. Verbalized acceptance and understanding.  Qualifies for Shingles Vaccine? Yes   Zostavax completed No   Shingrix Completed?: Yes  Screening Tests Health Maintenance  Topic Date Due  . URINE MICROALBUMIN  12/10/2018  . INFLUENZA VACCINE  09/06/2019  . HEMOGLOBIN A1C  09/09/2019  . COVID-19 Vaccine (1) 09/30/2019 (Originally 01/01/1947)  . FOOT EXAM  01/08/2020  . OPHTHALMOLOGY EXAM  08/06/2020  . DEXA SCAN   08/04/2021  . TETANUS/TDAP  04/12/2029  . PNA vac Low Risk Adult  Completed    Health Maintenance  Health Maintenance Due  Topic Date Due  . URINE MICROALBUMIN  12/10/2018  . INFLUENZA VACCINE  09/06/2019  . HEMOGLOBIN A1C  09/09/2019    Colorectal cancer screening: No longer required.  Mammogram status: No longer required.  Bone Density status: Completed 08/05/19. Results reflect: Bone density results: OSTEOPENIA. Repeat every 2 years.  Lung Cancer Screening: (Low Dose CT Chest recommended if Age 10-80 years, 30 pack-year currently smoking OR have quit w/in 15years.) does not qualify.    Additional Screening:  Vision Screening: Recommended annual ophthalmology exams for early detection of glaucoma and other disorders of the eye. Is the patient up to date with their annual eye exam?  Yes  Who is the provider or what is the name of the office in which the patient attends annual eye exams? Dr Wallace Going If pt is not established with a provider, would they like to be referred to a provider to establish care? No .   Dental Screening: Recommended annual dental exams for proper oral hygiene  Community Resource Referral / Chronic Care Management: CRR required this visit?  No   CCM required this visit?  No      Plan:     I have personally reviewed and noted the following in the patient's chart:   . Medical and social history . Use of alcohol, tobacco or illicit drugs  . Current medications and supplements . Functional ability and status . Nutritional status . Physical activity . Advanced directives .  List of other physicians . Hospitalizations, surgeries, and ER visits in previous 12 months . Vitals . Screenings to include cognitive, depression, and falls . Referrals and appointments  In addition, I have reviewed and discussed with patient certain preventive protocols, quality metrics, and best practice recommendations. A written personalized care plan for preventive  services as well as general preventive health recommendations were provided to patient.     Talor Cheema [place], [place]   09/06/4173   Nurse Notes: Pt needs a urine check and Hgb A1c check today.

## 2019-09-14 ENCOUNTER — Other Ambulatory Visit: Payer: Self-pay

## 2019-09-14 ENCOUNTER — Ambulatory Visit (INDEPENDENT_AMBULATORY_CARE_PROVIDER_SITE_OTHER): Payer: Medicare Other

## 2019-09-14 ENCOUNTER — Ambulatory Visit (INDEPENDENT_AMBULATORY_CARE_PROVIDER_SITE_OTHER): Payer: Medicare Other | Admitting: Physician Assistant

## 2019-09-14 ENCOUNTER — Encounter: Payer: Self-pay | Admitting: Family Medicine

## 2019-09-14 ENCOUNTER — Encounter: Payer: Self-pay | Admitting: Physician Assistant

## 2019-09-14 VITALS — BP 122/76 | HR 82 | Temp 98.6°F | Ht 60.0 in | Wt 131.2 lb

## 2019-09-14 DIAGNOSIS — E039 Hypothyroidism, unspecified: Secondary | ICD-10-CM

## 2019-09-14 DIAGNOSIS — E1169 Type 2 diabetes mellitus with other specified complication: Secondary | ICD-10-CM | POA: Diagnosis not present

## 2019-09-14 DIAGNOSIS — E559 Vitamin D deficiency, unspecified: Secondary | ICD-10-CM

## 2019-09-14 DIAGNOSIS — Z0000 Encounter for general adult medical examination without abnormal findings: Secondary | ICD-10-CM

## 2019-09-14 DIAGNOSIS — E785 Hyperlipidemia, unspecified: Secondary | ICD-10-CM | POA: Diagnosis not present

## 2019-09-14 DIAGNOSIS — G20 Parkinson's disease: Secondary | ICD-10-CM | POA: Diagnosis not present

## 2019-09-14 DIAGNOSIS — E119 Type 2 diabetes mellitus without complications: Secondary | ICD-10-CM | POA: Diagnosis not present

## 2019-09-14 NOTE — Progress Notes (Signed)
Complete physical exam   Patient: Rebecca Patterson   DOB: 07/04/1934   85 y.o. Female  MRN: [id number] Visit Date: 09/14/2019  Today's healthcare provider: Mar Daring, PA-C   Chief Complaint  Patient presents with  . Annual Exam   Subjective    Rebecca Patterson is a 85 y.o. female who presents today for a complete physical exam.  She reports consuming a general diet. The patient does not participate in regular exercise at present. She generally feels well. She reports sleeping well. She does not have additional problems to discuss today.  HPI   Past Medical History:  Diagnosis Date  . Allergy   . Breast cancer, right ([facility]) 2013   Lumpectomy with Mammosite radiation and Tamoxifen  . Complication of anesthesia   . Corn   . Family history of adverse reaction to anesthesia    sister - slow to wake  . Fibrocystic breast   . GERD (gastroesophageal reflux disease)   . 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 Hyperlipidemia   . Osteoporosis   . Osteoporosis   . Personal history of radiation therapy   . PONV (postoperative nausea and vomiting)   . Thyroid disease   . Vertigo    long ago  . Wears dentures    full upper and lower  . Wears hearing aid    bilateral   Past Surgical History:  Procedure Laterality Date  . ABDOMINAL HYSTERECTOMY    . APPENDECTOMY    . BREAST BIOPSY Right 2013   Stereo- Positive  . BREAST CYST EXCISION Right n/a   cyst removed  . BREAST LUMPECTOMY Right    2013  . BUNIONECTOMY Bilateral 2004  . 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 CATARACT EXTRACTION W/PHACO Right 11/10/2014   Procedure: CATARACT EXTRACTION PHACO AND INTRAOCULAR LENS PLACEMENT (IOC);  Surgeon: Leandrew Koyanagi, MD;  Location: [facility];  Service: Ophthalmology;  Laterality: Right;  . CHOLECYSTECTOMY    . ESOPHAGOGASTRODUODENOSCOPY (EGD) WITH PROPOFOL N/A 04/23/2018   Procedure: ESOPHAGOGASTRODUODENOSCOPY (EGD) WITH PROPOFOL;  Surgeon:  Virgel Manifold, MD;  Location: [facility];  Service: Endoscopy;  Laterality: N/A;  pt prefers later in am  . KNEE SURGERY Right 2005  . Mastoid surgery Right   . SKIN CANCER EXCISION  03/17/2014   right side of face Dr. Aubery Lapping  . THYROID SURGERY N/A 1980   Goiter   Social History   Socioeconomic History  . Marital status: Married    Spouse name: Juanda Crumble  . Number of children: 2  . Years of education: Not on file  . Highest education level: 12th grade  Occupational History  . Occupation: retired  Tobacco Use  . Smoking status: Never Smoker  . Smokeless tobacco: Never Used  Vaping Use  . Vaping Use: Never used  Substance and Sexual Activity  . Alcohol use: No  . Drug use: No  . Sexual activity: Not on file  Other Topics Concern  . Not on file  Social History Narrative  . Not on file   Social Determinants of Health   Financial Resource Strain: Low Risk   . Difficulty of Paying Living Expenses: Not hard at all  Food Insecurity: No Food Insecurity  . Worried About Charity fundraiser in the Last Year: Never true  . Ran Out of Food in the Last Year: Never true  Transportation Needs: No Transportation Needs  . Lack of Transportation (Medical): No  . Lack  of Transportation (Non-Medical): No  Physical Activity: Inactive  . Days of Exercise per Week: 0 days  . Minutes of Exercise per Session: 0 min  Stress: No Stress Concern Present  . Feeling of Stress : Not at all  Social Connections: Moderately Isolated  . Frequency of Communication with Friends and Family: More than three times a week  . Frequency of Social Gatherings with Friends and Family: More than three times a week  . Attends Religious Services: Never  . Active Member of Clubs or Organizations: No  . Attends Archivist Meetings: Never  . Marital Status: Married  Human resources officer Violence: Not At Risk  . Fear of Current or Ex-Partner: No  . Emotionally Abused: No  . Physically Abused:  No  . Sexually Abused: No   Family Status  Relation Name Status  . Sister louise Deceased  . Sister Leisure centre manager  . Mother  Deceased       heart disease  . Father  Deceased       CAD  . Brother cecil Deceased at age 67 months  . Sister [employer]  . Brother Financial controller  . Brother Animal nutritionist  . Daughter  Alive       ovarian mass  . Other nieces X2 Alive  . Neg Hx  (Not Specified)   Family History  Problem Relation Age of Onset  . Stroke Sister   . Breast cancer Sister 63  . Breast cancer Other   . Colon cancer Neg Hx    Allergies  Allergen Reactions  . Actonel [Risedronate]   . Boniva [Ibandronic Acid]     shakes  . Fosamax [Alendronate]     Patient reports medication caused her to severe mood changes.    Patient Care Team: Virginia Crews, MD as PCP - General (Family Medicine) Leandrew Koyanagi, MD as Referring Physician (Ophthalmology) Cammie Sickle, MD as Consulting Physician (Internal Medicine) Isaias Cowman, MD as Consulting Physician (Cardiology) Flora Lipps, MD as Consulting Physician (Pulmonary Disease) Vertell Limber (Neurology) Vladimir Crofts, MD as Consulting Physician (Neurology)   Medications: Outpatient Medications Prior to Visit  Medication Sig  . albuterol (VENTOLIN HFA) 108 (90 Base) MCG/ACT inhaler Inhale 2 puffs into the lungs every 4 (four) hours as needed for wheezing or shortness of breath.  Marland Kitchen aspirin EC 81 MG tablet Take 81 mg by mouth daily.  . calcium carbonate (OS-CAL) 600 MG TABS tablet Take 600 mg by mouth daily with breakfast.   . carbidopa-levodopa (SINEMET IR) 25-100 MG tablet Take 1.5 tablets by mouth 3 (three) times daily.   Marland Kitchen docusate sodium (COLACE) 100 MG capsule Take by mouth daily.  . famotidine (PEPCID) 20 MG tablet Take 20 mg by mouth as needed.   Marland Kitchen ketoconazole (NIZORAL) 2 % cream Apply 1 application topically daily as needed.   Marland Kitchen levothyroxine (SYNTHROID) 50 MCG tablet TAKE 1 TABLET BY  MOUTH EVERY DAY  . polyethylene glycol (MIRALAX / GLYCOLAX) packet Take 17 g by mouth daily as needed.   . pravastatin (PRAVACHOL) 40 MG tablet TAKE 1 TABLET BY MOUTH EVERY DAY  . sertraline (ZOLOFT) 25 MG tablet Take 25 mg by mouth daily.  Marland Kitchen triamcinolone cream (KENALOG) 0.1 % APPLY TO AFFECTED AREA 3 TIMES A DAY FOR INSECT BITES (Patient taking differently: Apply 1 application topically as needed. )   No facility-administered medications prior to visit.    Review of Systems  Constitutional: Negative.   HENT: Negative.  Eyes: Negative.   Respiratory: Negative.   Cardiovascular: Negative.   Gastrointestinal: Negative.   Endocrine: Negative.   Genitourinary: Negative.   Musculoskeletal: Negative.   Skin: Negative.   Allergic/Immunologic: Negative.   Neurological: Negative.   Hematological: Negative.   Psychiatric/Behavioral: Negative.     Last CBC Lab Results  Component Value Date   WBC 5.7 06/16/2019   HGB 13.0 06/16/2019   HCT 38.8 06/16/2019   MCV 92.6 06/16/2019   MCH 31.0 06/16/2019   RDW 13.1 06/16/2019   PLT 172 66/44/0347   Last metabolic panel Lab Results  Component Value Date   GLUCOSE 123 (H) 06/16/2019   NA 140 06/16/2019   K 4.2 06/16/2019   CL 103 06/16/2019   CO2 29 06/16/2019   BUN 16 06/16/2019   CREATININE 0.66 06/16/2019   GFRNONAA >60 06/16/2019   GFRAA >60 06/16/2019   CALCIUM 8.7 (L) 06/16/2019   PHOS 3.7 08/18/2015   PROT 7.1 06/16/2019   ALBUMIN 4.6 06/16/2019   LABGLOB 1.9 03/12/2019   AGRATIO 2.4 (H) 03/12/2019   BILITOT 1.0 06/16/2019   ALKPHOS 74 06/16/2019   AST 13 (L) 06/16/2019   ALT 6 06/16/2019   ANIONGAP 8 06/16/2019      Objective     Vitals:  BP 122/76 (BP Location: Right Arm)  Pulse 82  Temp 98.6 F (37 C) (Oral)  Ht 5' (1.524 m)  Wt 131 lb 3.2 oz (59.5 kg)  SpO2 97%  BMI 25.62 kg/m  BSA 1.59 m    BP Readings from Last 3 Encounters:  09/14/19 122/76  06/16/19 102/64  03/12/19 112/71   Wt Readings  from Last 3 Encounters:  09/14/19 131 lb 3.2 oz (59.5 kg)  06/16/19 133 lb (60.3 kg)  03/12/19 128 lb (58.1 kg)      Physical Exam Vitals reviewed.  Constitutional:      General: She is not in acute distress.    Appearance: Normal appearance. She is well-developed. She is not ill-appearing or diaphoretic.  HENT:     Head: Normocephalic and atraumatic.     Right Ear: Tympanic membrane, ear canal and external ear normal.     Left Ear: Tympanic membrane, ear canal and external ear normal.  Eyes:     General: No scleral icterus.       Right eye: No discharge.        Left eye: No discharge.     Extraocular Movements: Extraocular movements intact.     Conjunctiva/sclera: Conjunctivae normal.     Pupils: Pupils are equal, round, and reactive to light.  Neck:     Thyroid: No thyromegaly.     Vascular: No carotid bruit or JVD.     Trachea: No tracheal deviation.  Cardiovascular:     Rate and Rhythm: Normal rate and regular rhythm.     Pulses: Normal pulses.     Heart sounds: Normal heart sounds. No murmur heard.  No friction rub. No gallop.   Pulmonary:     Effort: Pulmonary effort is normal. No respiratory distress.     Breath sounds: Normal breath sounds. No wheezing or rales.  Chest:     Chest wall: No tenderness.  Abdominal:     General: Abdomen is flat. Bowel sounds are normal. There is no distension.     Palpations: Abdomen is soft. There is no mass.     Tenderness: There is no abdominal tenderness. There is no guarding or rebound.  Musculoskeletal:  General: No tenderness. Normal range of motion.     Cervical back: Normal range of motion and neck supple.     Right lower leg: No edema.     Left lower leg: No edema.  Lymphadenopathy:     Cervical: No cervical adenopathy.  Skin:    General: Skin is warm and dry.     Capillary Refill: Capillary refill takes less than 2 seconds.     Findings: No rash.  Neurological:     General: No focal deficit present.      Mental Status: She is alert and oriented to person, place, and time. Mental status is at baseline.  Psychiatric:        Mood and Affect: Mood normal.        Behavior: Behavior normal.        Thought Content: Thought content normal.        Judgment: Judgment normal.     Last depression screening scores PHQ 2/9 Scores 09/14/2019 09/09/2018 09/05/2017  PHQ - 2 Score 0 0 0  PHQ- 9 Score - - 0   Last fall risk screening Fall Risk  09/14/2019  Falls in the past year? 0  Number falls in past yr: 0  Injury with Fall? 0   Last Audit-C alcohol use screening Alcohol Use Disorder Test (AUDIT) 09/14/2019  1. How often do you have a drink containing alcohol? 0  2. How many drinks containing alcohol do you have on a typical day when you are drinking? 0  3. How often do you have six or more drinks on one occasion? 0  AUDIT-C Score 0  Alcohol Brief Interventions/Follow-up AUDIT Score <7 follow-up not indicated   A score of 3 or more in women, and 4 or more in men indicates increased risk for alcohol abuse, EXCEPT if all of the points are from question 1   No results found for any visits on 09/14/19.  Assessment & Plan    Routine Health Maintenance and Physical Exam  Exercise Activities and Dietary recommendations Goals    . Exercise 150 minutes per week (moderate activity)    . water intake     Continue drinking 6-8 glasses of water a day.        Immunization History  Administered Date(s) Administered  . Fluad Quad(high Dose 65+) 11/04/2018  . Influenza, High Dose Seasonal PF 11/11/2015, 10/10/2016, 12/09/2017  . Influenza,inj,Quad PF,6+ Mos 02/18/2015  . Influenza-Unspecified 10/06/2013  . Pneumococcal Conjugate-13 10/21/2013  . Pneumococcal Polysaccharide-23 01/20/2010  . Td 09/03/2005  . Tdap 04/13/2019  . Zoster Recombinat (Shingrix) 04/13/2019, 06/15/2019    Health Maintenance  Topic Date Due  . URINE MICROALBUMIN  12/10/2018  . INFLUENZA VACCINE  09/06/2019  . HEMOGLOBIN A1C   09/09/2019  . COVID-19 Vaccine (1) 09/30/2019 (Originally 01/01/1947)  . FOOT EXAM  01/08/2020  . OPHTHALMOLOGY EXAM  08/06/2020  . DEXA SCAN  08/04/2021  . TETANUS/TDAP  04/12/2029  . PNA vac Low Risk Adult  Completed    Discussed health benefits of physical activity, and encouraged her to engage in regular exercise appropriate for her age and condition.  1. Encounter for annual physical exam Normal exam. Up to date on vaccinations and screenings patient is agreeable to.  2. Type 2 diabetes mellitus without complication, without long-term current use of insulin (Rebecca Patterson) Previously stable. Diet controlled. Will check labs as below and f/u pending results. - Lipid panel - Hemoglobin A1c  3. Adult hypothyroidism Stable on levothyroxine 64mcg. Will check  labs as below and f/u pending results. - TSH  4. Hyperlipidemia associated with type 2 diabetes mellitus ([facility]) Stable. Continue Pravastatin 40mg . Will check labs as below and f/u pending results. - Lipid panel  5. Parkinson disease (Rebecca Patterson) Stable. Followed by Neurology. Continue Sinimet as prescribed.   6. Avitaminosis D H/O this and postmenopausal. Will check labs as below and f/u pending results. - Vitamin D (25 hydroxy)   No follow-ups on file.     Rebecca Bowl, PA-C, have reviewed all documentation for this visit. The documentation on 09/27/19 for the exam, diagnosis, procedures, and orders are all accurate and complete.   Rebecca Patterson  [facility] [phone] (phone) [phone] (fax)  [place]

## 2019-09-14 NOTE — Patient Instructions (Signed)
Health Maintenance After Age 83 After age 83, you are at a higher risk for certain long-term diseases and infections as well as injuries from falls. Falls are a major cause of broken bones and head injuries in people who are older than age 83. Getting regular preventive care can help to keep you healthy and well. Preventive care includes getting regular testing and making lifestyle changes as recommended by your health care provider. Talk with your health care provider about:  Which screenings and tests you should have. A screening is a test that checks for a disease when you have no symptoms.  A diet and exercise plan that is right for you. What should I know about screenings and tests to prevent falls? Screening and testing are the best ways to find a health problem early. Early diagnosis and treatment give you the best chance of managing medical conditions that are common after age 83. Certain conditions and lifestyle choices may make you more likely to have a fall. Your health care provider may recommend:  Regular vision checks. Poor vision and conditions such as cataracts can make you more likely to have a fall. If you wear glasses, make sure to get your prescription updated if your vision changes.  Medicine review. Work with your health care provider to regularly review all of the medicines you are taking, including over-the-counter medicines. Ask your health care provider about any side effects that may make you more likely to have a fall. Tell your health care provider if any medicines that you take make you feel dizzy or sleepy.  Osteoporosis screening. Osteoporosis is a condition that causes the bones to get weaker. This can make the bones weak and cause them to break more easily.  Blood pressure screening. Blood pressure changes and medicines to control blood pressure can make you feel dizzy.  Strength and balance checks. Your health care provider may recommend certain tests to check your  strength and balance while standing, walking, or changing positions.  Foot health exam. Foot pain and numbness, as well as not wearing proper footwear, can make you more likely to have a fall.  Depression screening. You may be more likely to have a fall if you have a fear of falling, feel emotionally low, or feel unable to do activities that you used to do.  Alcohol use screening. Using too much alcohol can affect your balance and may make you more likely to have a fall. What actions can I take to lower my risk of falls? General instructions  Talk with your health care provider about your risks for falling. Tell your health care provider if: ? You fall. Be sure to tell your health care provider about all falls, even ones that seem minor. ? You feel dizzy, sleepy, or off-balance.  Take over-the-counter and prescription medicines only as told by your health care provider. These include any supplements.  Eat a healthy diet and maintain a healthy weight. A healthy diet includes low-fat dairy products, low-fat (lean) meats, and fiber from whole grains, beans, and lots of fruits and vegetables. Home safety  Remove any tripping hazards, such as rugs, cords, and clutter.  Install safety equipment such as grab bars in bathrooms and safety rails on stairs.  Keep rooms and walkways well-lit. Activity   Follow a regular exercise program to stay fit. This will help you maintain your balance. Ask your health care provider what types of exercise are appropriate for you.  If you need a cane or   walker, use it as recommended by your health care provider.  Wear supportive shoes that have nonskid soles. Lifestyle  Do not drink alcohol if your health care provider tells you not to drink.  If you drink alcohol, limit how much you have: ? 0-1 drink a day for women. ? 0-2 drinks a day for men.  Be aware of how much alcohol is in your drink. In the [place] one drink equals one typical bottle of beer (12  oz), one-half glass of wine (5 oz), or one shot of hard liquor (1 oz).  Do not use any products that contain nicotine or tobacco, such as cigarettes and e-cigarettes. If you need help quitting, ask your health care provider. Summary  Having a healthy lifestyle and getting preventive care can help to protect your health and wellness after age 83.  Screening and testing are the best way to find a health problem early and help you avoid having a fall. Early diagnosis and treatment give you the best chance for managing medical conditions that are more common for people who are older than age 83.  Falls are a major cause of broken bones and head injuries in people who are older than age 83. Take precautions to prevent a fall at home.  Work with your health care provider to learn what changes you can make to improve your health and wellness and to prevent falls. This information is not intended to replace advice given to you by your health care provider. Make sure you discuss any questions you have with your health care provider. Document Revised: 05/15/2018 Document Reviewed: 12/05/2016 Elsevier Patient Education  2020 [employer]  

## 2019-09-14 NOTE — Patient Instructions (Signed)
Rebecca Patterson , Thank you for taking time to come for your Medicare Wellness Visit. I appreciate your ongoing commitment to your health goals. Please review the following plan we discussed and let me know if I can assist you in the future.   Screening recommendations/referrals: Colonoscopy: No longer required.  Mammogram: No longer required.  Bone Density: Up to date, due 07/2021 Recommended yearly ophthalmology/optometry visit for glaucoma screening and checkup Recommended yearly dental visit for hygiene and checkup  Vaccinations: Influenza vaccine: Done 11/04/19 Pneumococcal vaccine: Completed series Tdap vaccine: Up to date, due 04/2029 Shingles vaccine: Completed series    Advanced directives: Currently on file.  Conditions/risks identified: Continue to increase water intake to 6-8 8 oz glasses a day.   Next appointment: 11:00 AM today with [facility] 65 Years and Older, Female Preventive care refers to lifestyle choices and visits with your health care provider that can promote health and wellness. What does preventive care include?  A yearly physical exam. This is also called an annual well check.  Dental exams once or twice a year.  Routine eye exams. Ask your health care provider how often you should have your eyes checked.  Personal lifestyle choices, including:  Daily care of your teeth and gums.  Regular physical activity.  Eating a healthy diet.  Avoiding tobacco and drug use.  Limiting alcohol use.  Practicing safe sex.  Taking low-dose aspirin every day.  Taking vitamin and mineral supplements as recommended by your health care provider. What happens during an annual well check? The services and screenings done by your health care provider during your annual well check will depend on your age, overall health, lifestyle risk factors, and family history of disease. Counseling  Your health care provider may ask you questions about  your:  Alcohol use.  Tobacco use.  Drug use.  Emotional well-being.  Home and relationship well-being.  Sexual activity.  Eating habits.  History of falls.  Memory and ability to understand (cognition).  Work and work Statistician.  Reproductive health. Screening  You may have the following tests or measurements:  Height, weight, and BMI.  Blood pressure.  Lipid and cholesterol levels. These may be checked every 5 years, or more frequently if you are over 66 years old.  Skin check.  Lung cancer screening. You may have this screening every year starting at age 47 if you have a 30-pack-year history of smoking and currently smoke or have quit within the past 15 years.  Fecal occult blood test (FOBT) of the stool. You may have this test every year starting at age 22.  Flexible sigmoidoscopy or colonoscopy. You may have a sigmoidoscopy every 5 years or a colonoscopy every 10 years starting at age 26.  Hepatitis C blood test.  Hepatitis B blood test.  Sexually transmitted disease (STD) testing.  Diabetes screening. This is done by checking your blood sugar (glucose) after you have not eaten for a while (fasting). You may have this done every 1-3 years.  Bone density scan. This is done to screen for osteoporosis. You may have this done starting at age 25.  Mammogram. This may be done every 1-2 years. Talk to your health care provider about how often you should have regular mammograms. Talk with your health care provider about your test results, treatment options, and if necessary, the need for more tests. Vaccines  Your health care provider may recommend certain vaccines, such as:  Influenza vaccine. This is recommended  every year.  Tetanus, diphtheria, and acellular pertussis (Tdap, Td) vaccine. You may need a Td booster every 10 years.  Zoster vaccine. You may need this after age 50.  Pneumococcal 13-valent conjugate (PCV13) vaccine. One dose is recommended  after age 17.  Pneumococcal polysaccharide (PPSV23) vaccine. One dose is recommended after age 67. Talk to your health care provider about which screenings and vaccines you need and how often you need them. This information is not intended to replace advice given to you by your health care provider. Make sure you discuss any questions you have with your health care provider. Document Released: 02/18/2015 Document Revised: 10/12/2015 Document Reviewed: 11/23/2014 Elsevier Interactive Patient Education  2017 [place] Prevention in the Home Falls can cause injuries. They can happen to people of all ages. There are many things you can do to make your home safe and to help prevent falls. What can I do on the outside of my home?  Regularly fix the edges of walkways and driveways and fix any cracks.  Remove anything that might make you trip as you walk through a door, such as a raised step or threshold.  Trim any bushes or trees on the path to your home.  Use bright outdoor lighting.  Clear any walking paths of anything that might make someone trip, such as rocks or tools.  Regularly check to see if handrails are loose or broken. Make sure that both sides of any steps have handrails.  Any raised decks and porches should have guardrails on the edges.  Have any leaves, snow, or ice cleared regularly.  Use sand or salt on walking paths during winter.  Clean up any spills in your garage right away. This includes oil or grease spills. What can I do in the bathroom?  Use night lights.  Install grab bars by the toilet and in the tub and shower. Do not use towel bars as grab bars.  Use non-skid mats or decals in the tub or shower.  If you need to sit down in the shower, use a plastic, non-slip stool.  Keep the floor dry. Clean up any water that spills on the floor as soon as it happens.  Remove soap buildup in the tub or shower regularly.  Attach bath mats securely with  double-sided non-slip rug tape.  Do not have throw rugs and other things on the floor that can make you trip. What can I do in the bedroom?  Use night lights.  Make sure that you have a light by your bed that is easy to reach.  Do not use any sheets or blankets that are too big for your bed. They should not hang down onto the floor.  Have a firm chair that has side arms. You can use this for support while you get dressed.  Do not have throw rugs and other things on the floor that can make you trip. What can I do in the kitchen?  Clean up any spills right away.  Avoid walking on wet floors.  Keep items that you use a lot in easy-to-reach places.  If you need to reach something above you, use a strong step stool that has a grab bar.  Keep electrical cords out of the way.  Do not use floor polish or wax that makes floors slippery. If you must use wax, use non-skid floor wax.  Do not have throw rugs and other things on the floor that can make you trip. What  can I do with my stairs?  Do not leave any items on the stairs.  Make sure that there are handrails on both sides of the stairs and use them. Fix handrails that are broken or loose. Make sure that handrails are as long as the stairways.  Check any carpeting to make sure that it is firmly attached to the stairs. Fix any carpet that is loose or worn.  Avoid having throw rugs at the top or bottom of the stairs. If you do have throw rugs, attach them to the floor with carpet tape.  Make sure that you have a light switch at the top of the stairs and the bottom of the stairs. If you do not have them, ask someone to add them for you. What else can I do to help prevent falls?  Wear shoes that:  Do not have high heels.  Have rubber bottoms.  Are comfortable and fit you well.  Are closed at the toe. Do not wear sandals.  If you use a stepladder:  Make sure that it is fully opened. Do not climb a closed stepladder.  Make  sure that both sides of the stepladder are locked into place.  Ask someone to hold it for you, if possible.  Clearly mark and make sure that you can see:  Any grab bars or handrails.  First and last steps.  Where the edge of each step is.  Use tools that help you move around (mobility aids) if they are needed. These include:  Canes.  Walkers.  Scooters.  Crutches.  Turn on the lights when you go into a dark area. Replace any light bulbs as soon as they burn out.  Set up your furniture so you have a clear path. Avoid moving your furniture around.  If any of your floors are uneven, fix them.  If there are any pets around you, be aware of where they are.  Review your medicines with your doctor. Some medicines can make you feel dizzy. This can increase your chance of falling. Ask your doctor what other things that you can do to help prevent falls. This information is not intended to replace advice given to you by your health care provider. Make sure you discuss any questions you have with your health care provider. Document Released: 11/18/2008 Document Revised: 06/30/2015 Document Reviewed: 02/26/2014 Elsevier Interactive Patient Education  2017 [employer].

## 2019-09-15 ENCOUNTER — Telehealth: Payer: Self-pay

## 2019-09-15 LAB — LIPID PANEL
Chol/HDL Ratio: 2.4 ratio (ref 0.0–4.4)
Cholesterol, Total: 168 mg/dL (ref 100–199)
HDL: 71 mg/dL (ref >39)
LDL Chol Calc (NIH): 79 mg/dL (ref 0–99)
Triglycerides: 100 mg/dL (ref 0–149)
VLDL Cholesterol Cal: 18 mg/dL (ref 5–40)

## 2019-09-15 LAB — TSH: TSH: 0.795 u[IU]/mL (ref 0.450–4.500)

## 2019-09-15 LAB — HEMOGLOBIN A1C
Est. average glucose Bld gHb Est-mCnc: 140 mg/dL
Hgb A1c MFr Bld: 6.5 % — ABNORMAL HIGH (ref 4.8–5.6)

## 2019-09-15 LAB — VITAMIN D 25 HYDROXY (VIT D DEFICIENCY, FRACTURES): Vit D, 25-Hydroxy: 31 ng/mL (ref 30.0–100.0)

## 2019-09-15 NOTE — Telephone Encounter (Signed)
-----   Message from Mar Daring, [place] sent at 09/15/2019  9:02 AM EDT ----- Cholesterol is normal. A1c is increased slightly compared to last year, now 6.5. Had been 6.2. TSH is normal. Vit D is normal.

## 2019-09-15 NOTE — Telephone Encounter (Signed)
Pt. Given results, verbalizes understanding. 

## 2019-09-15 NOTE — Telephone Encounter (Signed)
LMTCB-if patient calls back ok for [facility] nurse to give results. Thanks.

## 2019-09-15 NOTE — Telephone Encounter (Signed)
I called and left a voicemail to call the office.

## 2019-12-11 ENCOUNTER — Ambulatory Visit (INDEPENDENT_AMBULATORY_CARE_PROVIDER_SITE_OTHER): Payer: Medicare Other | Admitting: Family Medicine

## 2019-12-11 DIAGNOSIS — Z23 Encounter for immunization: Secondary | ICD-10-CM | POA: Diagnosis not present

## 2019-12-17 ENCOUNTER — Ambulatory Visit: Payer: Medicare Other | Admitting: Family Medicine

## 2020-01-09 ENCOUNTER — Other Ambulatory Visit: Payer: Self-pay | Admitting: Family Medicine

## 2020-01-09 DIAGNOSIS — E039 Hypothyroidism, unspecified: Secondary | ICD-10-CM

## 2020-01-09 NOTE — Telephone Encounter (Signed)
Requested Prescriptions  Pending Prescriptions Disp Refills  . levothyroxine (SYNTHROID) 50 MCG tablet [Pharmacy Med Name: LEVOTHYROXINE 50 MCG TABLET] 90 tablet 3    Sig: TAKE 1 TABLET BY MOUTH EVERY DAY     Endocrinology:  Hypothyroid Agents Failed - 01/09/2020  4:55 PM      Failed - TSH needs to be rechecked within 3 months after an abnormal result. Refill until TSH is due.      Passed - TSH in normal range and within 360 days    TSH  Date Value Ref Range Status  09/14/2019 0.795 0.450 - 4.500 uIU/mL Final         Passed - Valid encounter within last 12 months    Recent Outpatient Visits          3 months ago Encounter for annual physical exam   [facility] Fenton Malling M, PA-C   6 months ago Need for shingles vaccine   [facility], Dionne Bucy, MD   9 months ago Need for shingles vaccine   [facility], Dionne Bucy, MD   10 months ago Type 2 diabetes mellitus without complication, without long-term current use of insulin [facility])   [facility], Dionne Bucy, MD   1 year ago Unintentional weight loss   [facility], Dionne Bucy, MD      Future Appointments            In 8 months Bacigalupo, Dionne Bucy, MD [facility] [place]           . pravastatin (PRAVACHOL) 40 MG tablet [Pharmacy Med Name: PRAVASTATIN SODIUM 40 MG TAB] 90 tablet 3    Sig: TAKE 1 TABLET BY MOUTH EVERY DAY     Cardiovascular:  Antilipid - Statins Failed - 01/09/2020  4:55 PM      Failed - LDL in normal range and within 360 days    LDL Chol Calc (NIH)  Date Value Ref Range Status  09/14/2019 79 0 - 99 mg/dL Final         Passed - Total Cholesterol in normal range and within 360 days    Cholesterol, Total  Date Value Ref Range Status  09/14/2019 168 100 - 199 mg/dL Final         Passed - HDL in normal range and within 360 days    HDL  Date Value Ref Range Status  09/14/2019 71  >39 mg/dL Final         Passed - Triglycerides in normal range and within 360 days    Triglycerides  Date Value Ref Range Status  09/14/2019 100 0 - 149 mg/dL Final         Passed - Patient is not pregnant      Passed - Valid encounter within last 12 months    Recent Outpatient Visits          3 months ago Encounter for annual physical exam   [facility] Fenton Malling M, PA-C   6 months ago Need for shingles vaccine   [facility], Dionne Bucy, MD   9 months ago Need for shingles vaccine   [facility], Dionne Bucy, MD   10 months ago Type 2 diabetes mellitus without complication, without long-term current use of insulin [facility])   [place], Dionne Bucy, MD   1 year ago Unintentional weight loss   [facility], Dionne Bucy, MD  Future Appointments            In 8 months Bacigalupo, Dionne Bucy, MD [facility] [place]

## 2020-01-21 DIAGNOSIS — G20 Parkinson's disease: Secondary | ICD-10-CM | POA: Diagnosis not present

## 2020-01-21 DIAGNOSIS — R1310 Dysphagia, unspecified: Secondary | ICD-10-CM | POA: Diagnosis not present

## 2020-01-21 DIAGNOSIS — R0600 Dyspnea, unspecified: Secondary | ICD-10-CM | POA: Diagnosis not present

## 2020-02-17 ENCOUNTER — Telehealth: Payer: Self-pay | Admitting: *Deleted

## 2020-02-17 NOTE — Chronic Care Management (AMB) (Signed)
  Chronic Care Management   Note  02/17/2020 Name: SHEWANDA SHARPE MRN: [id number] DOB: 01/31/1935  VENISHA BOEHNING is a 86 y.o. year old female who is a primary care patient of Brita Romp, Dionne Bucy, MD. I reached out to [name] by phone today in response to a referral sent by Ms. Cresenciano [employer]'s health plan.     Ms. Rankin was given information about Chronic Care Management services today including:  1. CCM service includes personalized support from designated clinical staff supervised by her physician, including individualized plan of care and coordination with other care providers 2. 24/7 contact phone numbers for assistance for urgent and routine care needs. 3. Service will only be billed when office clinical staff spend 20 minutes or more in a month to coordinate care. 4. Only one practitioner may furnish and bill the service in a calendar month. 5. The patient may stop CCM services at any time (effective at the end of the month) by phone call to the office staff. 6. The patient will be responsible for cost sharing (co-pay) of up to 20% of the service fee (after annual deductible is met).  Patient agreed to services and verbal consent obtained.   Follow up plan: Telephone appointment with care management team member scheduled for:03/17/2020  [place], [place] Management  Direct Dial [phone]

## 2020-03-10 DIAGNOSIS — H353132 Nonexudative age-related macular degeneration, bilateral, intermediate dry stage: Secondary | ICD-10-CM | POA: Diagnosis not present

## 2020-03-10 DIAGNOSIS — E119 Type 2 diabetes mellitus without complications: Secondary | ICD-10-CM | POA: Diagnosis not present

## 2020-03-10 LAB — HM DIABETES EYE EXAM

## 2020-03-17 ENCOUNTER — Telehealth: Payer: Medicare Other

## 2020-03-17 ENCOUNTER — Telehealth: Payer: Self-pay | Admitting: *Deleted

## 2020-03-17 ENCOUNTER — Telehealth: Payer: Self-pay

## 2020-03-17 NOTE — Chronic Care Management (AMB) (Signed)
  Care Management   Note  03/17/2020 Name: [name] MRN: [id number] DOB: Jun 22, 1934  Rebecca Patterson is a 86 y.o. year old female who is a primary care patient of Brita Romp, Dionne Bucy, MD and is actively engaged with the care management team. I reached out to [name] by phone today to assist with re-scheduling an initial visit with the RN Case Manager  Follow up plan: A telephone outreach attempt made patient unavailable. The care management team will reach out to the patient again over the next 7 days. If patient returns call to provider office, please advise to call [place] at [phone].  [place] Management

## 2020-03-17 NOTE — Telephone Encounter (Signed)
Copied from [place] [id number]. Topic: Quick Communication - See Telephone Encounter >> Mar 17, 2020  9:21 AM [place] wrote: CRM for notification. See Telephone encounter for: 03/17/20. Pt appt today 2/10 , at 11:00 CCM phone call needs to be resch as something more urgent has come up. Pls cancel and call later in week to resch,

## 2020-03-24 IMAGING — MG MM DIGITAL SCREENING BILAT W/ TOMO W/ CAD
8 series · 8 of 24 positions shown · non-contrast
Comparison: Previous exam(s).

CLINICAL DATA: Screening.

EXAM:
DIGITAL SCREENING BILATERAL MAMMOGRAM WITH TOMO AND CAD

[R MLO synth-2D]
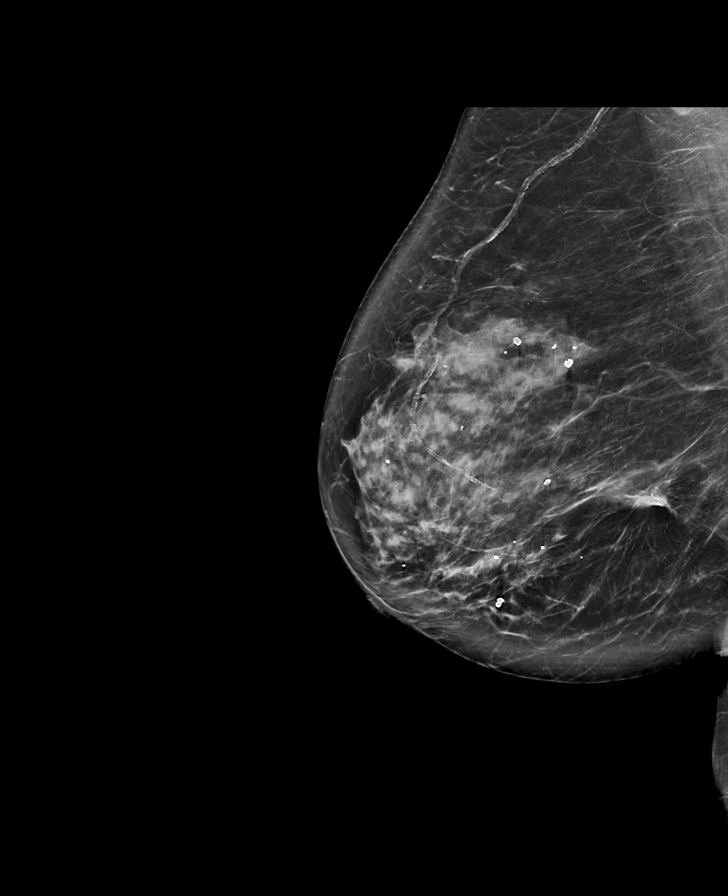

[L MLO synth-2D]
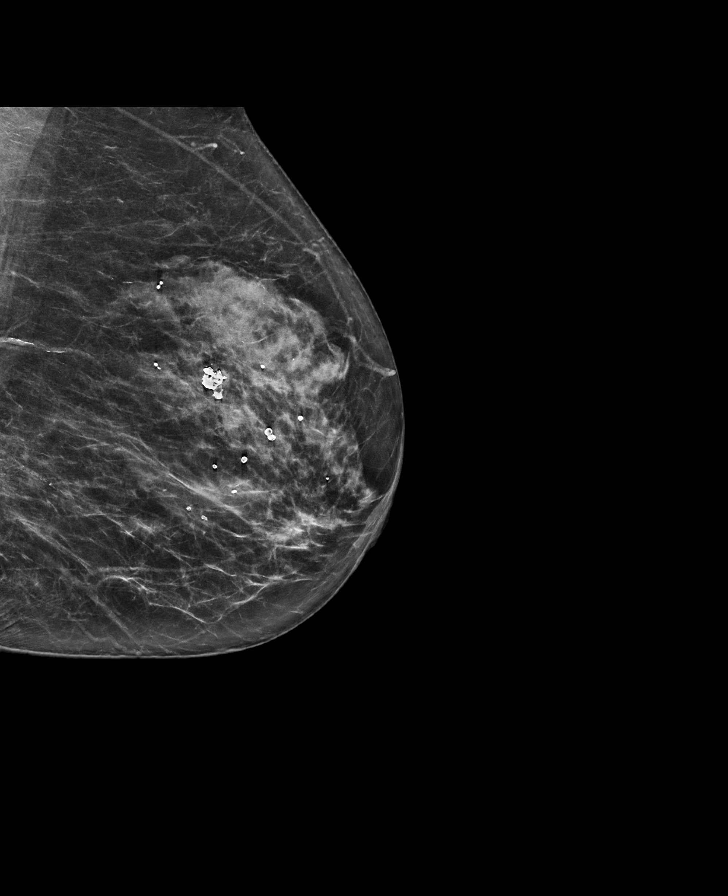

[L CC synth-2D]
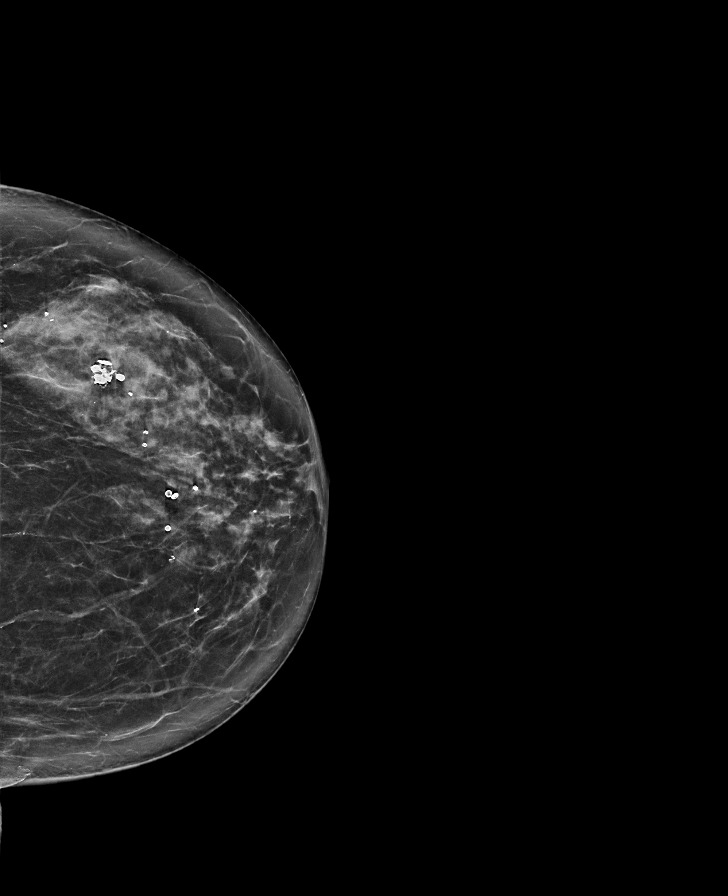

[R CC synth-2D]
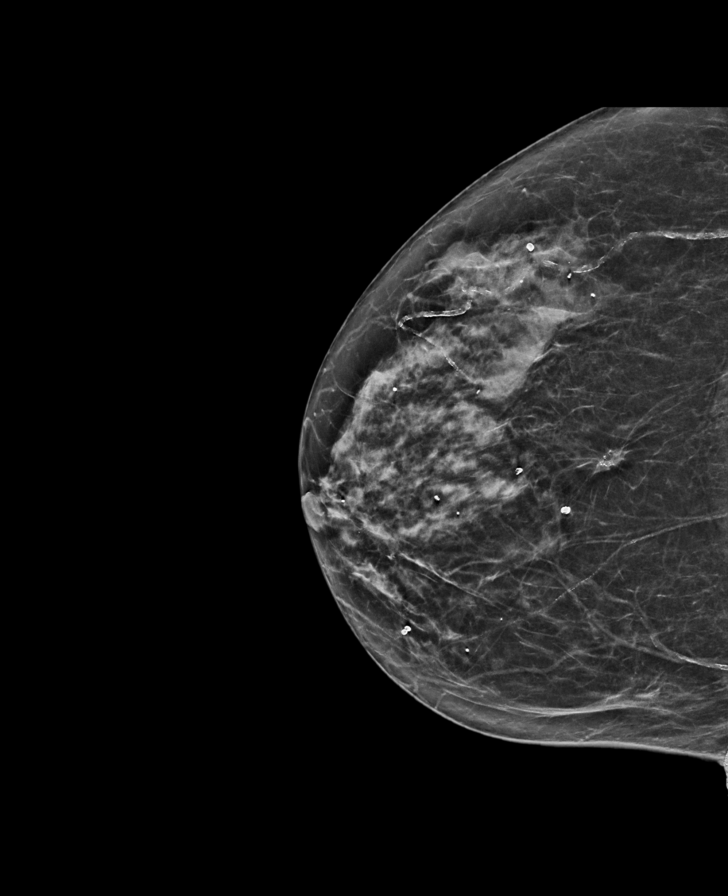

[L CC tomo · tomo slice 36/71.0]
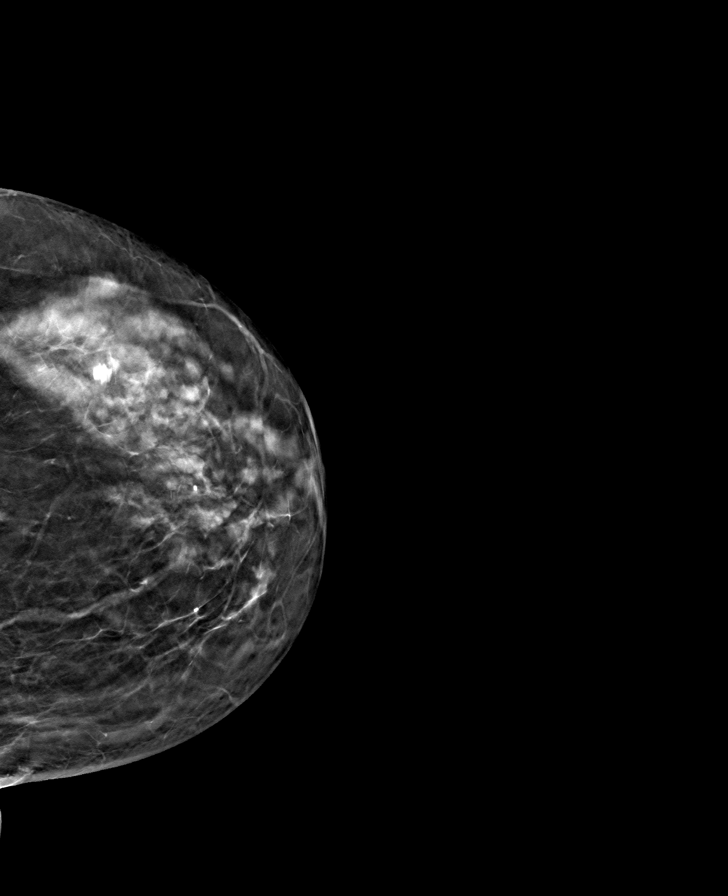

[L MLO tomo · tomo slice 33/66.0]
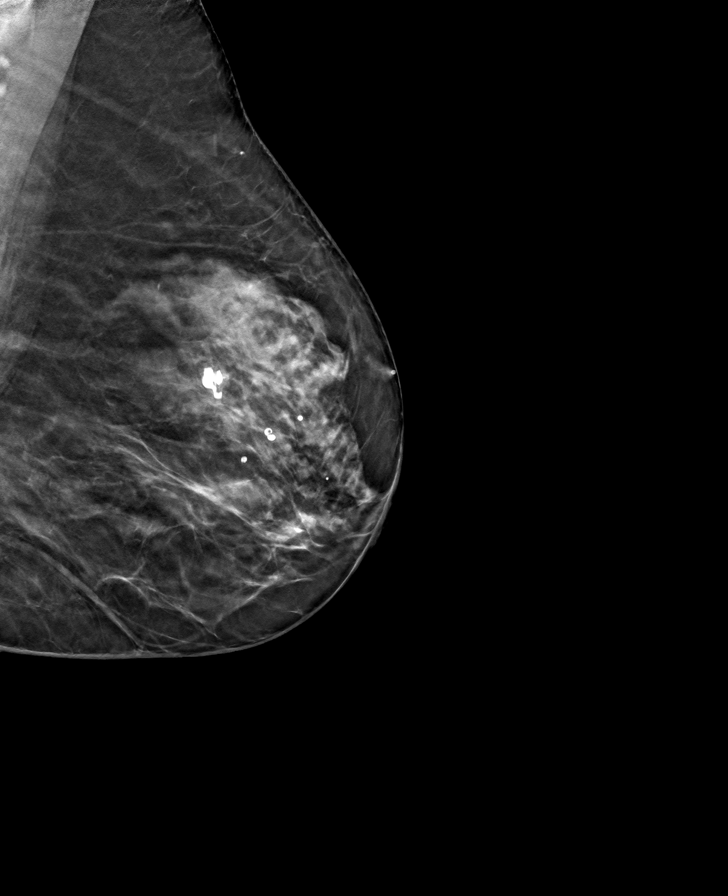

[R CC tomo · tomo slice 31/60.0]
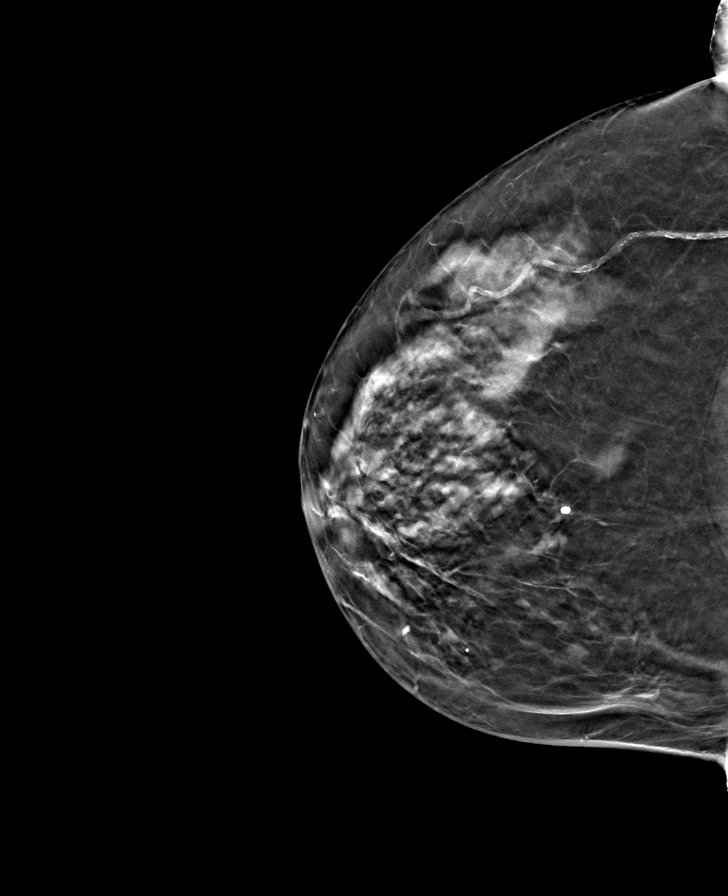

[R MLO tomo · tomo slice 41/82.0]
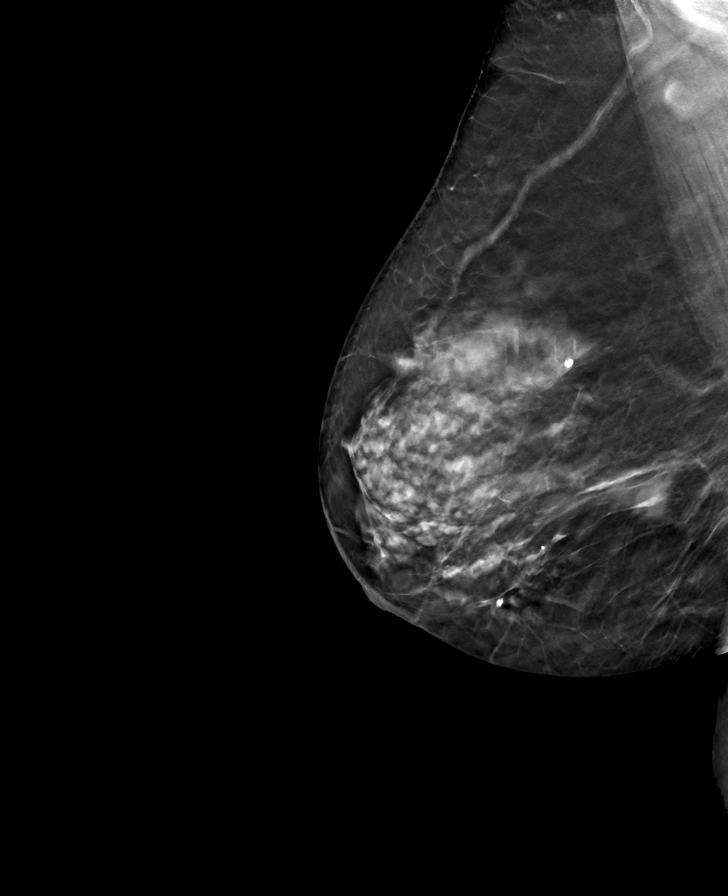

[8 of 24 positions shown; findings below may reference images not displayed]

ACR Breast Density Category c: The breast tissue is heterogeneously
dense, which may obscure small masses.
FINDINGS: There are no findings suspicious for malignancy. Images were
processed with CAD.
IMPRESSION: No mammographic evidence of malignancy. A result letter of this
screening mammogram will be mailed directly to the patient.

RECOMMENDATION:
Screening mammogram in one year. (Code:[id number])

BI-RADS CATEGORY  1: Negative.

## 2020-03-29 ENCOUNTER — Telehealth: Payer: Self-pay

## 2020-03-29 DIAGNOSIS — G20 Parkinson's disease: Secondary | ICD-10-CM

## 2020-03-29 DIAGNOSIS — E1169 Type 2 diabetes mellitus with other specified complication: Secondary | ICD-10-CM

## 2020-03-29 DIAGNOSIS — E039 Hypothyroidism, unspecified: Secondary | ICD-10-CM

## 2020-03-29 DIAGNOSIS — E119 Type 2 diabetes mellitus without complications: Secondary | ICD-10-CM

## 2020-03-29 DIAGNOSIS — E785 Hyperlipidemia, unspecified: Secondary | ICD-10-CM

## 2020-03-29 NOTE — Progress Notes (Signed)
Your patient is eligible for CCM services and the embedded care coordination team assigned to your practice and patients is available to provide assistance. Reached out to provider to make aware.  [place] Management

## 2020-03-29 NOTE — Telephone Encounter (Signed)
-----   Message from Virginia Crews, MD sent at 03/29/2020  1:02 PM EST ----- Regarding: FW: CCM Can you please place the order and associate it with her chronic conditions? Thanks! ----- Message ----- From: Osvaldo Shipper, NT Sent: 03/29/2020  12:19 PM EST To: Virginia Crews, MD, Neldon Labella, RN Subject: CCM                                            Your patient is eligible for CCM services and the embedded care coordination team assigned to your practice and patients is available to provide assistance. However, a referral order is required for initiation of CCM services. Your assistance with placement of an order utilizing the [id number] referral order, selecting "Embedded Chronic Care Management Services", requesting team member (Pharmacist, Nurse, Social Worker), and comment about specific need is appreciated.   Thank you,   [place] Management

## 2020-04-14 ENCOUNTER — Telehealth: Payer: Self-pay | Admitting: *Deleted

## 2020-04-14 NOTE — Chronic Care Management (AMB) (Signed)
  Chronic Care Management   Outreach Note  04/14/2020 Name: RAHMAH MCCAMY MRN: [id number] DOB: March 20, 1934  DIANNAH RINDFLEISCH is a 86 y.o. year old female who is a primary care patient of Brita Romp, Dionne Bucy, MD. I reached out to [name] by phone today in response to a referral sent by Ms. Gerarda Gunther PCP, Virginia Crews, MD.  An unsuccessful telephone outreach was attempted today. The patient was referred to the case management team for assistance with care management and care coordination.   Follow Up Plan: The care management team will reach out to the patient again over the next 7 days.  If patient returns call to provider office, please advise to call [place] at [phone].  [place] Management

## 2020-04-15 NOTE — Telephone Encounter (Signed)
Rebecca Patterson daughter would like a follow up call stating her mother is elderly and does not recall speaking with nurse, please advise

## 2020-04-15 NOTE — Chronic Care Management (AMB) (Signed)
  Chronic Care Management   Note  04/15/2020 Name: NHYLA NAPPI MRN: [id number] DOB: 08-03-1934  KINZI FREDIANI is a 86 y.o. year old female who is a primary care patient of Brita Romp, Dionne Bucy, MD. I reached out to [name] by phone today in response to a referral sent by Ms. Gerarda Gunther PCP, Virginia Crews, MD     Ms. Rawl was given information about Chronic Care Management services today including:  1. CCM service includes personalized support from designated clinical staff supervised by her physician, including individualized plan of care and coordination with other care providers 2. 24/7 contact phone numbers for assistance for urgent and routine care needs. 3. Service will only be billed when office clinical staff spend 20 minutes or more in a month to coordinate care. 4. Only one practitioner may furnish and bill the service in a calendar month. 5. The patient may stop CCM services at any time (effective at the end of the month) by phone call to the office staff. 6. The patient will be responsible for cost sharing (co-pay) of up to 20% of the service fee (after annual deductible is met).  Patient agreed to services and verbal consent obtained.   Follow up plan: Telephone appointment with care management team member scheduled for:05/10/2020  [place] Maya  Care Guide, Embedded Care Coordination [place]  Care Management

## 2020-05-03 DIAGNOSIS — L578 Other skin changes due to chronic exposure to nonionizing radiation: Secondary | ICD-10-CM | POA: Diagnosis not present

## 2020-05-03 DIAGNOSIS — Z859 Personal history of malignant neoplasm, unspecified: Secondary | ICD-10-CM | POA: Diagnosis not present

## 2020-05-03 DIAGNOSIS — L570 Actinic keratosis: Secondary | ICD-10-CM | POA: Diagnosis not present

## 2020-05-03 DIAGNOSIS — L853 Xerosis cutis: Secondary | ICD-10-CM | POA: Diagnosis not present

## 2020-05-03 DIAGNOSIS — L821 Other seborrheic keratosis: Secondary | ICD-10-CM | POA: Diagnosis not present

## 2020-05-03 DIAGNOSIS — Z872 Personal history of diseases of the skin and subcutaneous tissue: Secondary | ICD-10-CM | POA: Diagnosis not present

## 2020-05-03 DIAGNOSIS — Z85828 Personal history of other malignant neoplasm of skin: Secondary | ICD-10-CM | POA: Diagnosis not present

## 2020-05-03 DIAGNOSIS — C44319 Basal cell carcinoma of skin of other parts of face: Secondary | ICD-10-CM | POA: Diagnosis not present

## 2020-05-03 DIAGNOSIS — D485 Neoplasm of uncertain behavior of skin: Secondary | ICD-10-CM | POA: Diagnosis not present

## 2020-05-03 DIAGNOSIS — D1801 Hemangioma of skin and subcutaneous tissue: Secondary | ICD-10-CM | POA: Diagnosis not present

## 2020-05-09 ENCOUNTER — Telehealth: Payer: Self-pay

## 2020-05-09 NOTE — Progress Notes (Signed)
    Chronic Care Management Pharmacy Assistant   Name: [name]  MRN: [id number] DOB: March 12, 1934   Reason for Encounter: Medication Review /Initial question for initial visit with clinical pharmacist on 05/10/2020.   Recent office visits:  None ID  Recent consult visits:  None ID  Hospital visits:  None in previous 6 months  Medications: Outpatient Encounter Medications as of 05/09/2020  Medication Sig  . albuterol (VENTOLIN HFA) 108 (90 Base) MCG/ACT inhaler Inhale 2 puffs into the lungs every 4 (four) hours as needed for wheezing or shortness of breath.  Marland Kitchen aspirin EC 81 MG tablet Take 81 mg by mouth daily.  . calcium carbonate (OS-CAL) 600 MG TABS tablet Take 600 mg by mouth daily with breakfast.   . carbidopa-levodopa (SINEMET IR) 25-100 MG tablet Take 1.5 tablets by mouth 3 (three) times daily.   Marland Kitchen docusate sodium (COLACE) 100 MG capsule Take by mouth daily.  . famotidine (PEPCID) 20 MG tablet Take 20 mg by mouth as needed.   Marland Kitchen ketoconazole (NIZORAL) 2 % cream Apply 1 application topically daily as needed.   Marland Kitchen levothyroxine (SYNTHROID) 50 MCG tablet TAKE 1 TABLET BY MOUTH EVERY DAY  . Multiple Vitamins-Minerals (PRESERVISION AREDS 2 PO) Take 1 capsule by mouth 2 (two) times daily.  . polyethylene glycol (MIRALAX / GLYCOLAX) packet Take 17 g by mouth daily as needed.   . pravastatin (PRAVACHOL) 40 MG tablet TAKE 1 TABLET BY MOUTH EVERY DAY  . sertraline (ZOLOFT) 25 MG tablet Take 25 mg by mouth daily.  Marland Kitchen triamcinolone cream (KENALOG) 0.1 % APPLY TO AFFECTED AREA 3 TIMES A DAY FOR INSECT BITES (Patient taking differently: Apply 1 application topically as needed. )   No facility-administered encounter medications on file as of 05/09/2020.   Star Rating Drugs:  Pravastatin 40 mg last filled on 02/10/2020 [facility]   Have you seen any other providers since your last visit? no Any changes in your medications or health? no Any side effects from any  medications? no Do you have an symptoms or problems not managed by your medications? no Any concerns about your health right now? Yes  Patient daughter states patient has parkinson disease and she takes a medication for it but  her concern is her mother hand still shakes and wonder if the medication is working. Has your provider asked that you check blood pressure, blood sugar, or follow special diet at home? Yes  Patient daughter reports patient has a wrist blood pressure deceive and checks her blood pressure occasionally.  Do you get any type of exercise on a regular basis? Yes  Patient daughter states patient walks down the drive way to check her mailbox daily. Can you think of a goal you would like to reach for your health? None ID Do you have any problems getting your medications? no Is there anything that you would like to discuss during the appointment?   Parkinson medication.  Please bring medications and supplements to appointment  [place] Pharmacist Assistant [phone]

## 2020-05-10 ENCOUNTER — Ambulatory Visit: Payer: Medicare HMO

## 2020-05-10 DIAGNOSIS — E785 Hyperlipidemia, unspecified: Secondary | ICD-10-CM

## 2020-05-10 DIAGNOSIS — G20 Parkinson's disease: Secondary | ICD-10-CM

## 2020-05-10 DIAGNOSIS — E039 Hypothyroidism, unspecified: Secondary | ICD-10-CM

## 2020-05-10 DIAGNOSIS — M810 Age-related osteoporosis without current pathological fracture: Secondary | ICD-10-CM

## 2020-05-10 DIAGNOSIS — E1169 Type 2 diabetes mellitus with other specified complication: Secondary | ICD-10-CM

## 2020-05-10 DIAGNOSIS — E119 Type 2 diabetes mellitus without complications: Secondary | ICD-10-CM | POA: Diagnosis not present

## 2020-05-10 NOTE — Progress Notes (Signed)
Chronic Care Management Pharmacy Note  05/16/2020 Name:  Rebecca Patterson MRN:  [id number] DOB:  [birth year]  Subjective: Rebecca Patterson is an 86 y.o. year old female who is a primary patient of Bacigalupo, Dionne Bucy, MD.  The CCM team was consulted for assistance with disease management and care coordination needs.    Engaged with patient by telephone for initial visit in response to provider referral for pharmacy case management and/or care coordination services.   Consent to Services:  The patient was given the following information about Chronic Care Management services today, agreed to services, and gave verbal consent: 1. CCM service includes personalized support from designated clinical staff supervised by the primary care provider, including individualized plan of care and coordination with other care providers 2. 24/7 contact phone numbers for assistance for urgent and routine care needs. 3. Service will only be billed when office clinical staff spend 20 minutes or more in a month to coordinate care. 4. Only one practitioner may furnish and bill the service in a calendar month. 5.The patient may stop CCM services at any time (effective at the end of the month) by phone call to the office staff. 6. The patient will be responsible for cost sharing (co-pay) of up to 20% of the service fee (after annual deductible is met). Patient agreed to services and consent obtained.  Patient Care Team: Virginia Crews, MD as PCP - General (Family Medicine) Leandrew Koyanagi, MD as Referring Physician (Ophthalmology) Cammie Sickle, MD as Consulting Physician (Internal Medicine) Isaias Cowman, MD as Consulting Physician (Cardiology) Flora Lipps, MD as Consulting Physician (Pulmonary Disease) Vertell Limber (Neurology) Vladimir Crofts, MD as Consulting Physician (Neurology) Germaine Pomfret, [facility] (Pharmacist)  Recent office visits: 09/14/19: Patient presented to Fenton Malling, PA-C for follow-up.  Recent consult visits: 02/04/20: Patient presented to Dr. Sunday Corn (Neurology) for follow-up. Sinemet decreased to 1.5 tablets four times daily  01/21/20: Patient presented to Dr. Sunday Corn (Neurology) for follow-up. Sinemet increased to 2 tablets four times daily  Hospital visits: None in previous 6 months  Objective:  Lab Results  Component Value Date   CREATININE 0.66 06/16/2019   BUN 16 06/16/2019   GFRNONAA >60 06/16/2019   GFRAA >60 06/16/2019   NA 140 06/16/2019   K 4.2 06/16/2019   CALCIUM 8.7 (L) 06/16/2019   CO2 29 06/16/2019   GLUCOSE 123 (H) 06/16/2019    Lab Results  Component Value Date/Time   HGBA1C 6.5 (H) 09/14/2019 11:18 AM   HGBA1C 6.2 (A) 03/12/2019 11:36 AM   HGBA1C 6.1 (H) 09/09/2018 11:28 AM   MICROALBUR 50 12/09/2017 12:03 PM    Last diabetic Eye exam:  Lab Results  Component Value Date/Time   HMDIABEYEEXA No Retinopathy 03/10/2020 12:00 AM    Last diabetic Foot exam:  Lab Results  Component Value Date/Time   HMDIABFOOTEX Normal 01/08/2019 12:00 AM     Lab Results  Component Value Date   CHOL 168 09/14/2019   HDL 71 09/14/2019   LDLCALC 79 09/14/2019   TRIG 100 09/14/2019   CHOLHDL 2.4 09/14/2019    Hepatic Function Latest Ref Rng & Units 06/16/2019 03/12/2019 09/09/2018  Total Protein 6.5 - 8.1 g/dL 7.1 6.5 6.6  Albumin 3.5 - 5.0 g/dL 4.6 4.6 4.7(H)  AST 15 - 41 U/L 13(L) 8 10  ALT 0 - 44 U/L _0 Alk Phosphatase 38 - 126 U/L 74 69 49  Total Bilirubin 0.3 - 1.2 mg/dL 1.0  0.6 0.5  Bilirubin, Direct 0.00 - 0.40 mg/dL - - -    Lab Results  Component Value Date/Time   TSH 0.795 09/14/2019 11:18 AM   TSH 0.884 03/12/2019 12:04 PM    CBC Latest Ref Rng & Units 06/16/2019 09/09/2018 06/16/2018  WBC 4.0 - 10.5 K/uL 5.7 4.6 5.9  Hemoglobin 12.0 - 15.0 g/dL 13.0 12.1 12.7  Hematocrit 36.0 - 46.0 % 38.8 36.4 39.2  Platelets 150 - 400 K/uL 172 231 220    Lab Results  Component Value Date/Time   VD25OH 31.0  09/14/2019 11:18 AM   VD25OH 35.5 08/29/2016 10:58 AM    Clinical ASCVD: No  The ASCVD Risk score Mikey Bussing DC Jr., et al., 2013) failed to calculate for the following reasons:   The 2013 ASCVD risk score is only valid for ages 59 to 49    Depression screen PHQ 2/9 09/14/2019 09/09/2018 09/05/2017  Decreased Interest 0 0 0  Down, Depressed, Hopeless 0 0 0  PHQ - 2 Score 0 0 0  Altered sleeping - - 0  Tired, decreased energy - - 0  Change in appetite - - 0  Feeling bad or failure about yourself  - - 0  Trouble concentrating - - 0  Moving slowly or fidgety/restless - - 0  Suicidal thoughts - - 0  PHQ-9 Score - - 0  Difficult doing work/chores - - Not difficult at all    Social History   Tobacco Use  Smoking Status Never Smoker  Smokeless Tobacco Never Used   BP Readings from Last 3 Encounters:  09/14/19 122/76  06/16/19 102/64  03/12/19 112/71   Pulse Readings from Last 3 Encounters:  09/14/19 82  06/16/19 70  03/12/19 96   Wt Readings from Last 3 Encounters:  09/14/19 131 lb 3.2 oz (59.5 kg)  06/16/19 133 lb (60.3 kg)  03/12/19 128 lb (58.1 kg)   BMI Readings from Last 3 Encounters:  09/14/19 25.62 kg/m  06/16/19 24.33 kg/m  03/12/19 23.41 kg/m    Assessment/Interventions: Review of patient past medical history, allergies, medications, health status, including review of consultants reports, laboratory and other test data, was performed as part of comprehensive evaluation and provision of chronic care management services.   SDOH:  (Social Determinants of Health) assessments and interventions performed: Yes SDOH Interventions   Flowsheet Row Most Recent Value  SDOH Interventions   Financial Strain Interventions Intervention Not Indicated     SDOH Screenings   Alcohol Screen: Low Risk   . Last Alcohol Screening Score (AUDIT): 0  Depression (PHQ2-9): Low Risk   . PHQ-2 Score: 0  Financial Resource Strain: Low Risk   . Difficulty of Paying Living Expenses: Not hard  at all  Food Insecurity: No Food Insecurity  . Worried About Charity fundraiser in the Last Year: Never true  . Ran Out of Food in the Last Year: Never true  Housing: Low Risk   . Last Housing Risk Score: 0  Physical Activity: Inactive  . Days of Exercise per Week: 0 days  . Minutes of Exercise per Session: 0 min  Social Connections: Moderately Isolated  . Frequency of Communication with Friends and Family: More than three times a week  . Frequency of Social Gatherings with Friends and Family: More than three times a week  . Attends Religious Services: Never  . Active Member of Clubs or Organizations: No  . Attends Archivist Meetings: Never  . Marital Status: Married  Stress: No  Stress Concern Present  . Feeling of Stress : Not at all  Tobacco Use: Low Risk   . Smoking Tobacco Use: Never Smoker  . Smokeless Tobacco Use: Never Used  Transportation Needs: No Transportation Needs  . Lack of Transportation (Medical): No  . Lack of Transportation (Non-Medical): No    CCM Care Plan  Allergies  Allergen Reactions  . Actonel [Risedronate]   . Boniva [Ibandronic Acid]     shakes  . Fosamax [Alendronate]     Patient reports medication caused her to severe mood changes.    Medications Reviewed Today    Reviewed by Germaine Pomfret, [facility] (Pharmacist) on 05/10/20 at 21  Med List Status: <None>  Medication Order Taking? Sig Documenting Provider Last Dose Status Informant  albuterol (VENTOLIN HFA) 108 (90 Base) MCG/ACT inhaler [id number] Yes Inhale 2 puffs into the lungs every 4 (four) hours as needed for wheezing or shortness of breath. Flora Lipps, MD Taking Active   aspirin EC 81 MG tablet [id number] Yes Take 81 mg by mouth daily. [provider] Taking Active   carbidopa-levodopa (SINEMET IR) 25-100 MG tablet [id number] Yes Take 1.5 tablets by mouth 4 (four) times daily. [provider] Taking Active   Cholecalciferol (EQL VITAMIN D3) 25 MCG (1000 UT)  capsule [id number] Yes Take 1,000 Units by mouth daily. [provider] Taking Active   docusate sodium (COLACE) 100 MG capsule [id number] Yes Take by mouth daily. [provider] Taking Active   famotidine (PEPCID) 20 MG tablet [id number] Yes Take 20 mg by mouth as needed.  [provider] Taking Active            Med Note Gillie Manners Dec 09, 2017 11:01 AM)    levothyroxine (SYNTHROID) 50 MCG tablet [id number] Yes TAKE 1 TABLET BY MOUTH EVERY DAY Bacigalupo, Dionne Bucy, MD Taking Active   Multiple Vitamins-Minerals (PRESERVISION AREDS 2 PO) [id number] Yes Take 1 capsule by mouth 2 (two) times daily. [provider] Taking Active   pravastatin (PRAVACHOL) 40 MG tablet [id number] Yes TAKE 1 TABLET BY MOUTH EVERY DAY Bacigalupo, Dionne Bucy, MD Taking Active   sertraline (ZOLOFT) 25 MG tablet [id number] Yes Take 25 mg by mouth daily. [provider] Taking Active   vitamin B-12 (CYANOCOBALAMIN) 1000 MCG tablet [id number] Yes Take 1,000 mcg by mouth daily. [provider] Taking Active           Patient Active Problem List   Diagnosis Date Noted  . Tonsillar mass 01/13/2019  . Unintentional weight loss 09/09/2018  . Hiatal hernia   . Dysphagia 03/19/2018  . Parkinson disease ([place]) 09/09/2017  . Carcinoma of overlapping sites of right breast in female, estrogen receptor positive ([place]) 11/16/2015  . Awareness of heartbeats 08/17/2014  . 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 Cardiac conduction disorder 06/17/2014  . T2DM (type 2 diabetes mellitus) ([place]) 06/17/2014  . Hyperlipidemia associated with type 2 diabetes mellitus ([place]) 06/17/2014  . Adult hypothyroidism 06/17/2014  . Osteoporosis 06/17/2014  . 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 Avitaminosis D 06/17/2014    Immunization History  Administered Date(s) Administered  . Fluad Quad(high Dose 65+) 11/04/2018, 12/11/2019  . Influenza, High Dose Seasonal PF 11/11/2015, 10/10/2016, 12/09/2017  .  Influenza,inj,Quad PF,6+ Mos 02/18/2015  . Influenza-Unspecified 10/06/2013  . Pneumococcal Conjugate-13 10/21/2013  . Pneumococcal Polysaccharide-23 01/20/2010  . Td 09/03/2005  . Tdap 04/13/2019  . Zoster Recombinat (Shingrix) 04/13/2019, 06/15/2019    Conditions to be addressed/monitored:  Hyperlipidemia, Diabetes, Hypothyroidism, Osteoporosis  and Parkinson's Disease  Care Plan : General Pharmacy (Adult)  Updates made by Germaine Pomfret, RPH since 05/16/2020 12:00 AM    Problem: Hyperlipidemia, Diabetes, Hypothyroidism, Osteoporosis and Parkinson's Disease   Priority: High    Long-Range Goal: Patient-Specific Goal   Start Date: 05/10/2020  Expected End Date: 11/09/2020  This Visit's Progress: On track  Priority: High  Note:   Current Barriers:  . Unable to self administer medications as prescribed  Pharmacist Clinical Goal(s):  Marland Kitchen Patient will achieve control of cholesterol as evidenced by LDL less than 70 . achieve ability to self administer medications as prescribed through use of Livesign Automatic Pill Dispenser as evidenced by patient report through collaboration with PharmD and provider.   Interventions: . 1:1 collaboration with Brita Romp Dionne Bucy, MD regarding development and update of comprehensive plan of care as evidenced by provider attestation and co-signature . Inter-disciplinary care team collaboration (see longitudinal plan of care) . Comprehensive medication review performed; medication list updated in electronic medical record  Hyperlipidemia: (LDL goal < 70) -Subcortical region infarct -Uncontrolled -Current treatment: . Pravastatin 40 mg daily  -Current antiplatelet treatment: . Aspirin 81 mg daily  -Medications previously tried: NA  -Educated on Importance of limiting foods high in cholesterol; -Recommended to continue current medication  Diabetes (A1c goal <7%) -Controlled -Current medications: . None -Medications previously tried: NA   -Denies hypoglycemic/hyperglycemic symptoms -Educated on Exercise goal of 150 minutes per week; -Counseled to check feet daily and get yearly eye exams -Recommended to continue current medication  Depression/Anxiety (Goal: Maintain stable mood) -Controlled -Current treatment: . Sertraline 25 mg daily  -Medications previously tried/failed: NA -PHQ9: 0 -GAD7: NA -Educated on Benefits of medication for symptom control -Recommended to continue current medication  Osteopenia (Goal Improve bone density and prevent fractures) -Not ideally controlled -Last DEXA Scan: 08/05/19   T-Score femoral neck: -1.6  T-Score total hip: -1.1  T-Score lumbar spine: NA  T-Score forearm radius: -1.8  10-year probability of major osteoporotic fracture: 13.9%  10-year probability of hip fracture: 3.8% -Patient is a candidate for pharmacologic treatment due to T-Score -1.0 to -2.5 and 10-year risk of hip fracture > 3% -Current treatment  . Calcium Carbonate 600 mg daily  -Medications previously tried: NA  -Recommend [phone] units of vitamin D daily. Recommend 1200 mg of calcium daily from dietary and supplemental sources. -Recommended to continue current medication  Hypothyroidism (Goal: Maintain stable thyroid function) -Controlled -Current treatment  . Levothyroxine 50 mcg daily  -Medications previously tried: NA  -Recommended to continue current medication  Parkinson's Disease (Goal: minimize symptoms and improve quality of life) -Not ideally controlled -Current treatment  . Sinimet IR 25-100 mg 1.5 tablets three times daily  -Medications previously tried: NA  -Counseled to take 1/2 hour before or 2 hours after meals -Recommended to continue current medication  GERD (Goal: Prevent heartburn or reflux symptoms) -Controlled -Current treatment  . Famotidine 20 mg daily as needed -Medications previously tried: NA  -Uses 2-3 times weekly -Counseled on trigger avoidance, not laying down after  meals -Recommended to continue current medication  Patient Goals/Self-Care Activities . Patient will:  - take medications as prescribed target a minimum of 150 minutes of moderate intensity exercise weekly  Follow Up Plan: Telephone follow up appointment with care management team member scheduled for:  11/08/2020 at 1:00 PM      Medication Assistance: None required.  Patient affirms current coverage meets needs.  Patient's preferred pharmacy is:  [id number]- [place], [place]-  [address] [place] [address] Phone: [phone] Fax: [phone]  Uses pill box? No - Patient's daughter uses Counselling psychologist Pt endorses 100% compliance  We discussed: Current pharmacy is preferred with insurance plan and patient is satisfied with pharmacy services Patient decided to: Continue current medication management strategy  Care Plan and Follow Up Patient Decision:  Patient agrees to Care Plan and Follow-up.  Plan: Telephone follow up appointment with care management team member scheduled for:  11/08/2020 at 1:00 PM  Junius Argyle, PharmD, [place] [phone]

## 2020-05-16 NOTE — Patient Instructions (Signed)
Visit Information It was great speaking with you today!  Please let me know if you have any questions about our visit.  Goals Addressed            This Visit's Progress   . Manage My Medicine       Timeframe:  Long-Range Goal Priority:  High Start Date:  05/10/2020                           Expected End Date: 11/09/2020                      Follow Up Date: 09/03/2020    - call for medicine refill 2 or 3 days before it runs out - use a pillbox to sort medicine - use an alarm clock or phone to remind me to take my medicine    Why is this important?   . These steps will help you keep on track with your medicines.   Notes:        Patient Care Plan: General Pharmacy (Adult)    Problem Identified: Hyperlipidemia, Diabetes, Hypothyroidism, Osteoporosis and Parkinson's Disease   Priority: High    Long-Range Goal: Patient-Specific Goal   Start Date: 05/10/2020  Expected End Date: 11/09/2020  This Visit's Progress: On track  Priority: High  Note:   Current Barriers:  . Unable to self administer medications as prescribed  Pharmacist Clinical Goal(s):  Marland Kitchen Patient will achieve control of cholesterol as evidenced by LDL less than 70 . achieve ability to self administer medications as prescribed through use of Livesign Automatic Pill Dispenser as evidenced by patient report through collaboration with PharmD and provider.   Interventions: . 1:1 collaboration with Brita Romp Dionne Bucy, MD regarding development and update of comprehensive plan of care as evidenced by provider attestation and co-signature . Inter-disciplinary care team collaboration (see longitudinal plan of care) . Comprehensive medication review performed; medication list updated in electronic medical record  Hyperlipidemia: (LDL goal < 70) -Subcortical region infarct -Uncontrolled -Current treatment: . Pravastatin 40 mg daily  -Current antiplatelet treatment: . Aspirin 81 mg daily  -Medications previously tried: NA   -Educated on Importance of limiting foods high in cholesterol; -Recommended to continue current medication  Diabetes (A1c goal <7%) -Controlled -Current medications: . None -Medications previously tried: NA  -Denies hypoglycemic/hyperglycemic symptoms -Educated on Exercise goal of 150 minutes per week; -Counseled to check feet daily and get yearly eye exams -Recommended to continue current medication  Depression/Anxiety (Goal: Maintain stable mood) -Controlled -Current treatment: . Sertraline 25 mg daily  -Medications previously tried/failed: NA -PHQ9: 0 -GAD7: NA -Educated on Benefits of medication for symptom control -Recommended to continue current medication  Osteopenia (Goal Improve bone density and prevent fractures) -Not ideally controlled -Last DEXA Scan: 08/05/19   T-Score femoral neck: -1.6  T-Score total hip: -1.1  T-Score lumbar spine: NA  T-Score forearm radius: -1.8  10-year probability of major osteoporotic fracture: 13.9%  10-year probability of hip fracture: 3.8% -Patient is a candidate for pharmacologic treatment due to T-Score -1.0 to -2.5 and 10-year risk of hip fracture > 3% -Current treatment  . Calcium Carbonate 600 mg daily  -Medications previously tried: NA  -Recommend [phone] units of vitamin D daily. Recommend 1200 mg of calcium daily from dietary and supplemental sources. -Recommended to continue current medication  Hypothyroidism (Goal: Maintain stable thyroid function) -Controlled -Current treatment  . Levothyroxine 50 mcg daily  -Medications previously tried: NA  -  Recommended to continue current medication  Parkinson's Disease (Goal: minimize symptoms and improve quality of life) -Not ideally controlled -Current treatment  . Sinimet IR 25-100 mg 1.5 tablets three times daily  -Medications previously tried: NA  -Counseled to take 1/2 hour before or 2 hours after meals -Recommended to continue current medication  GERD (Goal: Prevent  heartburn or reflux symptoms) -Controlled -Current treatment  . Famotidine 20 mg daily as needed -Medications previously tried: NA  -Uses 2-3 times weekly -Counseled on trigger avoidance, not laying down after meals -Recommended to continue current medication  Patient Goals/Self-Care Activities . Patient will:  - take medications as prescribed target a minimum of 150 minutes of moderate intensity exercise weekly  Follow Up Plan: Telephone follow up appointment with care management team member scheduled for:  11/08/2020 at 1:00 PM    Ms. Knighton was given information about Chronic Care Management services today including:  1. CCM service includes personalized support from designated clinical staff supervised by her physician, including individualized plan of care and coordination with other care providers 2. 24/7 contact phone numbers for assistance for urgent and routine care needs. 3. Standard insurance, coinsurance, copays and deductibles apply for chronic care management only during months in which we provide at least 20 minutes of these services. Most insurances cover these services at 100%, however patients may be responsible for any copay, coinsurance and/or deductible if applicable. This service may help you avoid the need for more expensive face-to-face services. 4. Only one practitioner may furnish and bill the service in a calendar month. 5. The patient may stop CCM services at any time (effective at the end of the month) by phone call to the office staff.  Patient agreed to services and verbal consent obtained.   The patient verbalized understanding of instructions, educational materials, and care plan provided today and declined offer to receive copy of patient instructions, educational materials, and care plan.   Junius Argyle, PharmD, [place] [phone]

## 2020-06-02 DIAGNOSIS — C4491 Basal cell carcinoma of skin, unspecified: Secondary | ICD-10-CM | POA: Diagnosis not present

## 2020-06-02 DIAGNOSIS — C44319 Basal cell carcinoma of skin of other parts of face: Secondary | ICD-10-CM | POA: Diagnosis not present

## 2020-06-15 ENCOUNTER — Ambulatory Visit: Payer: Medicare Other | Admitting: Internal Medicine

## 2020-06-15 ENCOUNTER — Other Ambulatory Visit: Payer: Medicare Other

## 2020-06-27 ENCOUNTER — Telehealth: Payer: Self-pay | Admitting: Family Medicine

## 2020-06-27 DIAGNOSIS — Z1231 Encounter for screening mammogram for malignant neoplasm of breast: Secondary | ICD-10-CM

## 2020-06-27 NOTE — Telephone Encounter (Signed)
Copied from [place] [id number]. Topic: Referral - Request for Referral >> Jun 27, 2020  3:16 PM Rebecca Patterson wrote: Has patient seen PCP for this complaint? Yes.   *If NO, is insurance requiring patient see PCP for this issue before PCP can refer them? Referral for which specialty: Radiology  Preferred provider/office: [facility]  Reason for referral: mammogram

## 2020-06-28 NOTE — Telephone Encounter (Signed)
Patient advised to call [facility] to schedule appt for mammogram.

## 2020-07-13 ENCOUNTER — Encounter (INDEPENDENT_AMBULATORY_CARE_PROVIDER_SITE_OTHER): Payer: Self-pay

## 2020-07-13 ENCOUNTER — Inpatient Hospital Stay (HOSPITAL_BASED_OUTPATIENT_CLINIC_OR_DEPARTMENT_OTHER): Payer: Medicare HMO | Admitting: Internal Medicine

## 2020-07-13 ENCOUNTER — Encounter: Payer: Self-pay | Admitting: Internal Medicine

## 2020-07-13 ENCOUNTER — Inpatient Hospital Stay: Payer: Medicare HMO | Attending: Internal Medicine

## 2020-07-13 VITALS — BP 101/72 | HR 65 | Temp 98.7°F | Resp 14 | Wt 128.0 lb

## 2020-07-13 DIAGNOSIS — M810 Age-related osteoporosis without current pathological fracture: Secondary | ICD-10-CM | POA: Diagnosis not present

## 2020-07-13 DIAGNOSIS — C50811 Malignant neoplasm of overlapping sites of right female breast: Secondary | ICD-10-CM

## 2020-07-13 DIAGNOSIS — M8580 Other specified disorders of bone density and structure, unspecified site: Secondary | ICD-10-CM | POA: Diagnosis not present

## 2020-07-13 DIAGNOSIS — Z853 Personal history of malignant neoplasm of breast: Secondary | ICD-10-CM | POA: Diagnosis not present

## 2020-07-13 DIAGNOSIS — Z170 Estrogen receptor positive status [ER+]: Secondary | ICD-10-CM

## 2020-07-13 LAB — COMPREHENSIVE METABOLIC PANEL
ALT: <5 U/L (ref 0–44)
AST: 14 U/L — ABNORMAL LOW (ref 15–41)
Albumin: 4.3 g/dL (ref 3.5–5.0)
Alkaline Phosphatase: 60 U/L (ref 38–126)
Anion gap: 9 (ref 5–15)
BUN: 18 mg/dL (ref 8–23)
CO2: 29 mmol/L (ref 22–32)
Calcium: 8.7 mg/dL — ABNORMAL LOW (ref 8.9–10.3)
Chloride: 102 mmol/L (ref 98–111)
Creatinine, Ser: 0.76 mg/dL (ref 0.44–1.00)
GFR, Estimated: >60 mL/min (ref >60)
Glucose, Bld: 127 mg/dL — ABNORMAL HIGH (ref 70–99)
Potassium: 4.2 mmol/L (ref 3.5–5.1)
Sodium: 140 mmol/L (ref 135–145)
Total Bilirubin: 0.8 mg/dL (ref 0.3–1.2)
Total Protein: 6.8 g/dL (ref 6.5–8.1)

## 2020-07-13 LAB — CBC WITH DIFFERENTIAL/PLATELET
Abs Immature Granulocytes: 0.02 10*3/uL (ref 0.00–0.07)
Basophils Absolute: 0 10*3/uL (ref 0.0–0.1)
Basophils Relative: 1 %
Eosinophils Absolute: 0 10*3/uL (ref 0.0–0.5)
Eosinophils Relative: 1 %
HCT: 37.1 % (ref 36.0–46.0)
Hemoglobin: 12.3 g/dL (ref 12.0–15.0)
Immature Granulocytes: 0 %
Lymphocytes Relative: 41 %
Lymphs Abs: 2.2 10*3/uL (ref 0.7–4.0)
MCH: 30.8 pg (ref 26.0–34.0)
MCHC: 33.2 g/dL (ref 30.0–36.0)
MCV: 92.8 fL (ref 80.0–100.0)
Monocytes Absolute: 0.5 10*3/uL (ref 0.1–1.0)
Monocytes Relative: 9 %
Neutro Abs: 2.7 10*3/uL (ref 1.7–7.7)
Neutrophils Relative %: 48 %
Platelets: 213 10*3/uL (ref 150–400)
RBC: 4 MIL/uL (ref 3.87–5.11)
RDW: 12.9 % (ref 11.5–15.5)
WBC: 5.5 10*3/uL (ref 4.0–10.5)
nRBC: 0 % (ref 0.0–0.2)

## 2020-07-13 NOTE — Progress Notes (Signed)
[place] OFFICE PROGRESS NOTE  Patient Care Team: Brita Romp Dionne Bucy, MD as PCP - General (Family Medicine) Leandrew Koyanagi, MD as Referring Physician (Ophthalmology) Cammie Sickle, MD as Consulting Physician (Internal Medicine) Isaias Cowman, MD as Consulting Physician (Cardiology) Flora Lipps, MD as Consulting Physician (Pulmonary Disease) Vertell Limber (Neurology) Vladimir Crofts, MD as Consulting Physician (Neurology) Germaine Pomfret, [facility] (Pharmacist)  Cancer Staging No matching staging information was found for the patient.   Oncology History Overview Note  # 2013- Stage IA (pT1b pN0 (sn) cM0) grade 1 invasive mammary carcinoma of the right breast status post wide local excision and sentinel node biopsy on 04/05/11.; Tumor size 9 mm, grade 1, margins uninvolved by invasive carcinoma.  No DCIS.  1 sentinel lymph node negative;  ER/PR positive (90%),  HER-2/neu negative by FISH (HER2/CEP17 ratio 1.04). S/p RT; no chemo.   # Patient started anastrozole March 2013, changed to exemestane May 2013 due to side effects [until May 2018]  # OSTEOPOROSIS- Feb 2019- ca+vit D: Intol to oral Bisphosphonates; Reluctant to reclast/Prolia.   DIAGNOSIS: breast cancer  STAGE:   I      ;GOALS: cure  CURRENT/MOST RECENT THERAPY: surveillaince    Carcinoma of overlapping sites of right breast in female, estrogen receptor positive ([place])      INTERVAL HISTORY:  Rebecca Patterson 86 y.o.  female pleasant patient above history of stage I breast cancer ER PR positive HER-2/neu negative-status post aromatase inhibitor; finished May 2018 is here for follow-up.  Denies any new lumps or bumps.  Appetite is good.  No weight loss.  No nausea no vomiting.  No bone pain.    Review of Systems  Constitutional: Negative for chills, diaphoresis, fever, malaise/fatigue and weight loss.  HENT: Negative for nosebleeds and sore throat.   Eyes: Negative for  double vision.  Respiratory: Negative for cough, hemoptysis, sputum production, shortness of breath and wheezing.   Cardiovascular: Negative for chest pain, palpitations, orthopnea and leg swelling.  Gastrointestinal: Negative for abdominal pain, blood in stool, constipation, diarrhea, heartburn, melena, nausea and vomiting.  Genitourinary: Negative for dysuria, frequency and urgency.  Musculoskeletal: Negative for back pain and joint pain.  Skin: Negative.  Negative for itching and rash.  Neurological: Negative for dizziness, tingling, focal weakness, weakness and headaches.  Endo/Heme/Allergies: Does not bruise/bleed easily.  Psychiatric/Behavioral: Negative for depression. The patient is not nervous/anxious and does not have insomnia.       PAST MEDICAL HISTORY :  Past Medical History:  Diagnosis Date  . Allergy   . Breast cancer, right ([place]) 2013   Lumpectomy with Mammosite radiation and Tamoxifen  . Complication of anesthesia   . Corn   . Family history of adverse reaction to anesthesia    sister - slow to wake  . Fibrocystic breast   . GERD (gastroesophageal reflux disease)   . 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 Hyperlipidemia   . Osteoporosis   . Osteoporosis   . Personal history of radiation therapy   . PONV (postoperative nausea and vomiting)   . Thyroid disease   . Vertigo    long ago  . Wears dentures    full upper and lower  . Wears hearing aid    bilateral    PAST SURGICAL HISTORY :   Past Surgical History:  Procedure Laterality Date  . ABDOMINAL HYSTERECTOMY    . APPENDECTOMY    . BREAST BIOPSY Right 2013   Stereo-  Positive  . BREAST CYST EXCISION Right n/a   cyst removed  . BREAST LUMPECTOMY Right    2013  . BUNIONECTOMY Bilateral 2004  . 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 CATARACT EXTRACTION W/PHACO Right 11/10/2014   Procedure: CATARACT EXTRACTION PHACO AND INTRAOCULAR LENS PLACEMENT (IOC);  Surgeon: Leandrew Koyanagi, MD;   Location: [facility];  Service: Ophthalmology;  Laterality: Right;  . CHOLECYSTECTOMY    . ESOPHAGOGASTRODUODENOSCOPY (EGD) WITH PROPOFOL N/A 04/23/2018   Procedure: ESOPHAGOGASTRODUODENOSCOPY (EGD) WITH PROPOFOL;  Surgeon: Virgel Manifold, MD;  Location: [facility];  Service: Endoscopy;  Laterality: N/A;  pt prefers later in am  . KNEE SURGERY Right 2005  . Mastoid surgery Right   . SKIN CANCER EXCISION  03/17/2014   right side of face Dr. Aubery Lapping  . THYROID SURGERY N/A 1980   Goiter    FAMILY HISTORY :   Family History  Problem Relation Age of Onset  . Stroke Sister   . Breast cancer Sister 69  . Breast cancer Other   . Colon cancer Neg Hx     SOCIAL HISTORY:   Social History   Tobacco Use  . Smoking status: Never Smoker  . Smokeless tobacco: Never Used  Vaping Use  . Vaping Use: Never used  Substance Use Topics  . Alcohol use: No  . Drug use: No    ALLERGIES:  is allergic to actonel [risedronate], boniva [ibandronic acid], and fosamax [alendronate].  MEDICATIONS:  Current Outpatient Medications  Medication Sig Dispense Refill  . aspirin EC 81 MG tablet Take 81 mg by mouth daily.    . carbidopa-levodopa (SINEMET IR) 25-100 MG tablet Take 1.5 tablets by mouth 4 (four) times daily.    . Cholecalciferol (EQL VITAMIN D3) 25 MCG (1000 UT) capsule Take 1,000 Units by mouth daily.    Marland Kitchen docusate sodium (COLACE) 100 MG capsule Take by mouth daily.    . famotidine (PEPCID) 20 MG tablet Take 20 mg by mouth as needed.     Marland Kitchen levothyroxine (SYNTHROID) 50 MCG tablet TAKE 1 TABLET BY MOUTH EVERY DAY 90 tablet 3  . Multiple Vitamins-Minerals (PRESERVISION AREDS 2 PO) Take 1 capsule by mouth 2 (two) times daily.    . pravastatin (PRAVACHOL) 40 MG tablet TAKE 1 TABLET BY MOUTH EVERY DAY 90 tablet 3  . sertraline (ZOLOFT) 25 MG tablet Take 25 mg by mouth daily.    . vitamin B-12 (CYANOCOBALAMIN) 1000 MCG tablet Take 1,000 mcg by mouth daily.    Marland Kitchen albuterol  (VENTOLIN HFA) 108 (90 Base) MCG/ACT inhaler Inhale 2 puffs into the lungs every 4 (four) hours as needed for wheezing or shortness of breath. 1 g 6   No current facility-administered medications for this visit.    PHYSICAL EXAMINATION: ECOG PERFORMANCE STATUS: 0 - Asymptomatic  BP 101/72   Pulse 65   Temp 98.7 F (37.1 C)   Resp 14   Wt 128 lb (58.1 kg)   SpO2 98%   BMI 25.00 kg/m   Filed Weights   07/13/20 1420  Weight: 128 lb (58.1 kg)    Physical Exam Constitutional:      Comments: Frail-appearing Caucasian female patient.  Accompanied by daughter.  Ambulating independently.  HENT:     Head: Normocephalic and atraumatic.     Mouth/Throat:     Pharynx: No oropharyngeal exudate.  Eyes:     Pupils: Pupils are equal, round, and reactive to light.  Cardiovascular:     Rate and Rhythm:  Normal rate and regular rhythm.  Pulmonary:     Effort: Pulmonary effort is normal. No respiratory distress.     Breath sounds: Normal breath sounds. No wheezing.  Abdominal:     General: Bowel sounds are normal. There is no distension.     Palpations: Abdomen is soft. There is no mass.     Tenderness: There is no abdominal tenderness. There is no guarding or rebound.  Musculoskeletal:        General: No tenderness. Normal range of motion.     Cervical back: Normal range of motion and neck supple.  Skin:    General: Skin is warm.  Neurological:     Mental Status: She is alert and oriented to person, place, and time.  Psychiatric:        Mood and Affect: Affect normal.      LABORATORY DATA:  I have reviewed the data as listed    Component Value Date/Time   NA 140 07/13/2020 1411   NA 141 03/12/2019 1204   K 4.2 07/13/2020 1411   CL 102 07/13/2020 1411   CO2 29 07/13/2020 1411   GLUCOSE 127 (H) 07/13/2020 1411   BUN 18 07/13/2020 1411   BUN 16 03/12/2019 1204   CREATININE 0.76 07/13/2020 1411   CREATININE 0.85 11/04/2013 1029   CALCIUM 8.7 (L) 07/13/2020 1411   PROT 6.8  07/13/2020 1411   PROT 6.5 03/12/2019 1204   PROT 6.9 11/04/2013 1029   ALBUMIN 4.3 07/13/2020 1411   ALBUMIN 4.6 03/12/2019 1204   ALBUMIN 3.9 11/04/2013 1029   AST 14 (L) 07/13/2020 1411   AST 9 (L) 11/04/2013 1029   ALT <5 07/13/2020 1411   ALT 29 11/04/2013 1029   ALKPHOS 60 07/13/2020 1411   ALKPHOS 77 11/04/2013 1029   BILITOT 0.8 07/13/2020 1411   BILITOT 0.6 03/12/2019 1204   BILITOT 0.3 11/04/2013 1029   GFRNONAA >60 07/13/2020 1411   GFRNONAA >60 11/04/2013 1029   GFRNONAA >60 11/03/2012 1106   GFRAA >60 06/16/2019 1324   GFRAA >60 11/04/2013 1029   GFRAA >60 11/03/2012 1106    No results found for: SPEP, UPEP  Lab Results  Component Value Date   WBC 5.5 07/13/2020   NEUTROABS 2.7 07/13/2020   HGB 12.3 07/13/2020   HCT 37.1 07/13/2020   MCV 92.8 07/13/2020   PLT 213 07/13/2020      Chemistry      Component Value Date/Time   NA 140 07/13/2020 1411   NA 141 03/12/2019 1204   K 4.2 07/13/2020 1411   CL 102 07/13/2020 1411   CO2 29 07/13/2020 1411   BUN 18 07/13/2020 1411   BUN 16 03/12/2019 1204   CREATININE 0.76 07/13/2020 1411   CREATININE 0.85 11/04/2013 1029   GLU 101 07/01/2013 0000      Component Value Date/Time   CALCIUM 8.7 (L) 07/13/2020 1411   ALKPHOS 60 07/13/2020 1411   ALKPHOS 77 11/04/2013 1029   AST 14 (L) 07/13/2020 1411   AST 9 (L) 11/04/2013 1029   ALT <5 07/13/2020 1411   ALT 29 11/04/2013 1029   BILITOT 0.8 07/13/2020 1411   BILITOT 0.6 03/12/2019 1204   BILITOT 0.3 11/04/2013 1029       RADIOGRAPHIC STUDIES: I have personally reviewed the radiological images as listed and agreed with the findings in the report. No results found.   ASSESSMENT & PLAN:  Carcinoma of overlapping sites of right breast in female, estrogen receptor positive ([place]) #Stage  I breast cancer-status post adjuvant Aromasin; finished May 2018- STABLE.   # No evidence of recurrence.  Continue surveillance; awaiting bilateral mammogram August 05, 2020.  #BMD June 2021- 1.8/osteopenia; improved compared to 2019.  Continue calcium [see below] plus vitamin D 2000 units  # Mild hypocalcemia- 8.7; recommend ca gummies   # DISPOSITION:  # follow up in 12 months follow up/labs-cbc/cmp-Dr.B      Orders Placed This Encounter  Procedures  . DG Bone Density    Standing Status:   Future    Standing Expiration Date:   07/13/2021    Order Specific Question:   Reason for Exam (SYMPTOM  OR DIAGNOSIS REQUIRED)    Answer:   osteoporosis    Order Specific Question:   Preferred imaging location?    Answer:   [facility]  . CBC with Differential/Platelet    Standing Status:   Future    Standing Expiration Date:   07/13/2021  . Comprehensive metabolic panel    Standing Status:   Future    Standing Expiration Date:   07/13/2021   All questions were answered. The patient knows to call the clinic with any problems, questions or concerns.      Cammie Sickle, MD 07/13/2020 3:03 PM

## 2020-07-13 NOTE — Assessment & Plan Note (Signed)
#  Stage I breast cancer-status post adjuvant Aromasin; finished May 2018- STABLE.   # No evidence of recurrence.  Continue surveillance; awaiting bilateral mammogram August 05, 2020.  #BMD June 2021- 1.8/osteopenia; improved compared to 2019.  Continue calcium [see below] plus vitamin D 2000 units  # Mild hypocalcemia- 8.7; recommend ca gummies   # DISPOSITION:  # follow up in 12 months follow up/labs-cbc/cmp-Dr.B

## 2020-07-25 DIAGNOSIS — R7303 Prediabetes: Secondary | ICD-10-CM | POA: Diagnosis not present

## 2020-07-25 DIAGNOSIS — G20 Parkinson's disease: Secondary | ICD-10-CM | POA: Diagnosis not present

## 2020-07-25 DIAGNOSIS — Z170 Estrogen receptor positive status [ER+]: Secondary | ICD-10-CM | POA: Diagnosis not present

## 2020-07-25 DIAGNOSIS — E785 Hyperlipidemia, unspecified: Secondary | ICD-10-CM | POA: Diagnosis not present

## 2020-07-25 DIAGNOSIS — Z1331 Encounter for screening for depression: Secondary | ICD-10-CM | POA: Diagnosis not present

## 2020-07-25 DIAGNOSIS — E049 Nontoxic goiter, unspecified: Secondary | ICD-10-CM | POA: Diagnosis not present

## 2020-07-25 DIAGNOSIS — E039 Hypothyroidism, unspecified: Secondary | ICD-10-CM | POA: Diagnosis not present

## 2020-07-25 DIAGNOSIS — C50811 Malignant neoplasm of overlapping sites of right female breast: Secondary | ICD-10-CM | POA: Diagnosis not present

## 2020-07-25 DIAGNOSIS — Z853 Personal history of malignant neoplasm of breast: Secondary | ICD-10-CM | POA: Diagnosis not present

## 2020-07-26 DIAGNOSIS — R0600 Dyspnea, unspecified: Secondary | ICD-10-CM | POA: Diagnosis not present

## 2020-07-26 DIAGNOSIS — G20 Parkinson's disease: Secondary | ICD-10-CM | POA: Diagnosis not present

## 2020-07-26 DIAGNOSIS — I639 Cerebral infarction, unspecified: Secondary | ICD-10-CM | POA: Diagnosis not present

## 2020-08-02 IMAGING — CR DG ELBOW COMPLETE 3+V*R*
1 series · 4 of 4 positions shown · non-contrast
Comparison: None.

CLINICAL DATA: Right elbow pain since a fall 3 weeks ago. Initial
encounter.

EXAM:
RIGHT ELBOW - COMPLETE 3+ VIEW

[Series 1: dg elbow complete right (3+view) · 0.14mm/px · 4 of 4 slices shown]
[im 1/4]
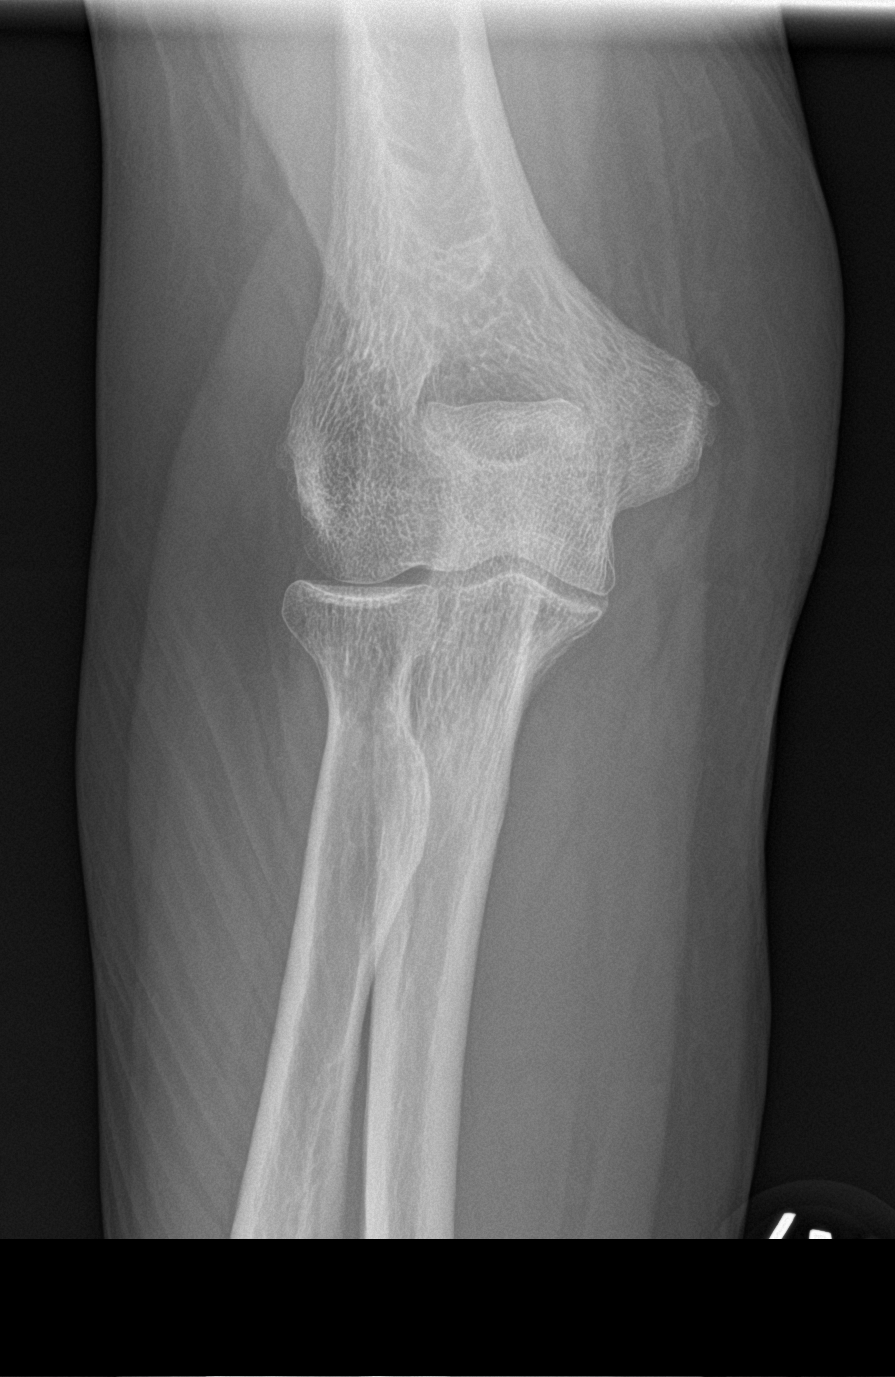
[im 2/4]
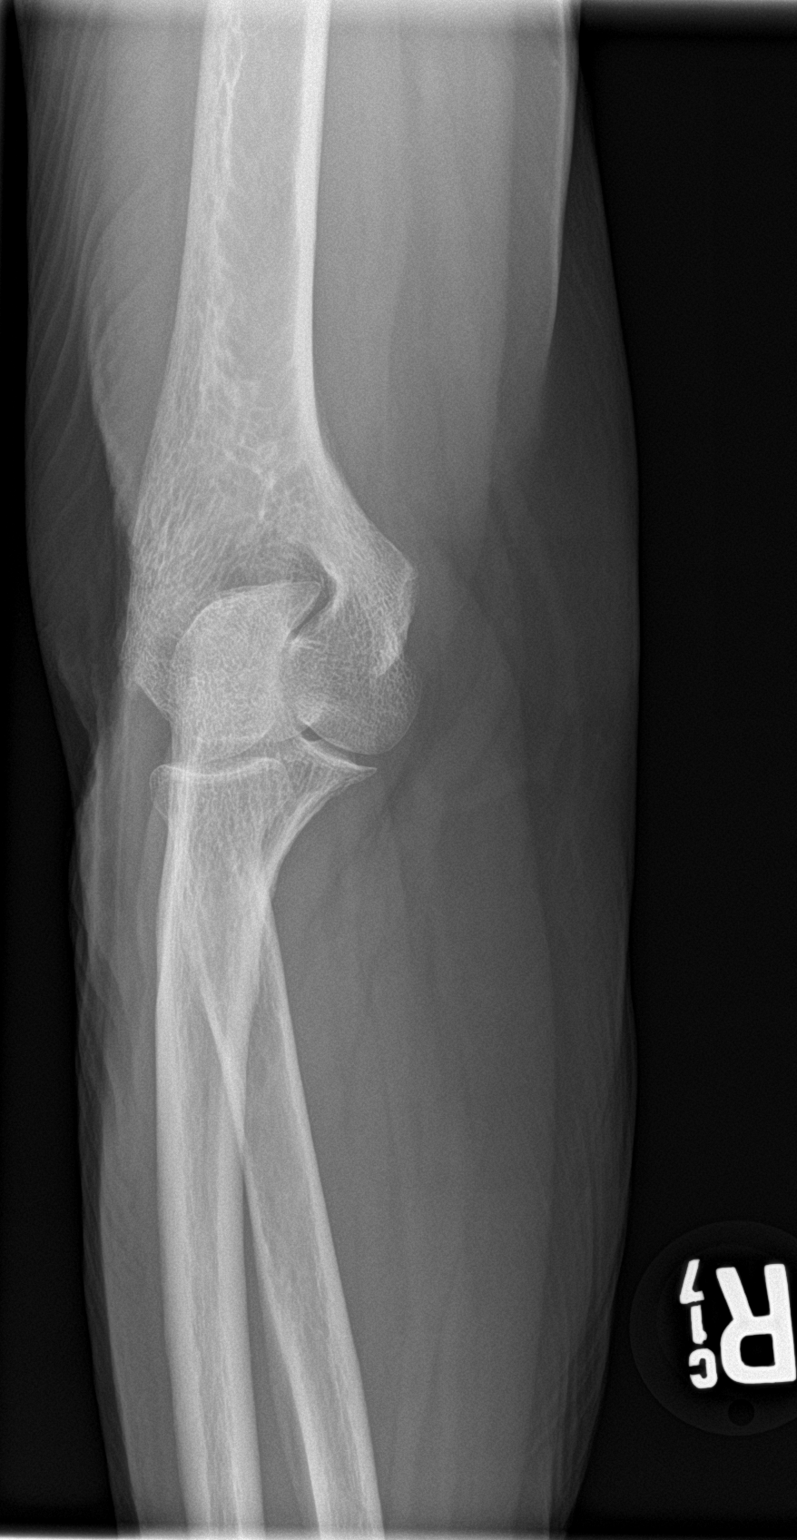
[im 3/4]
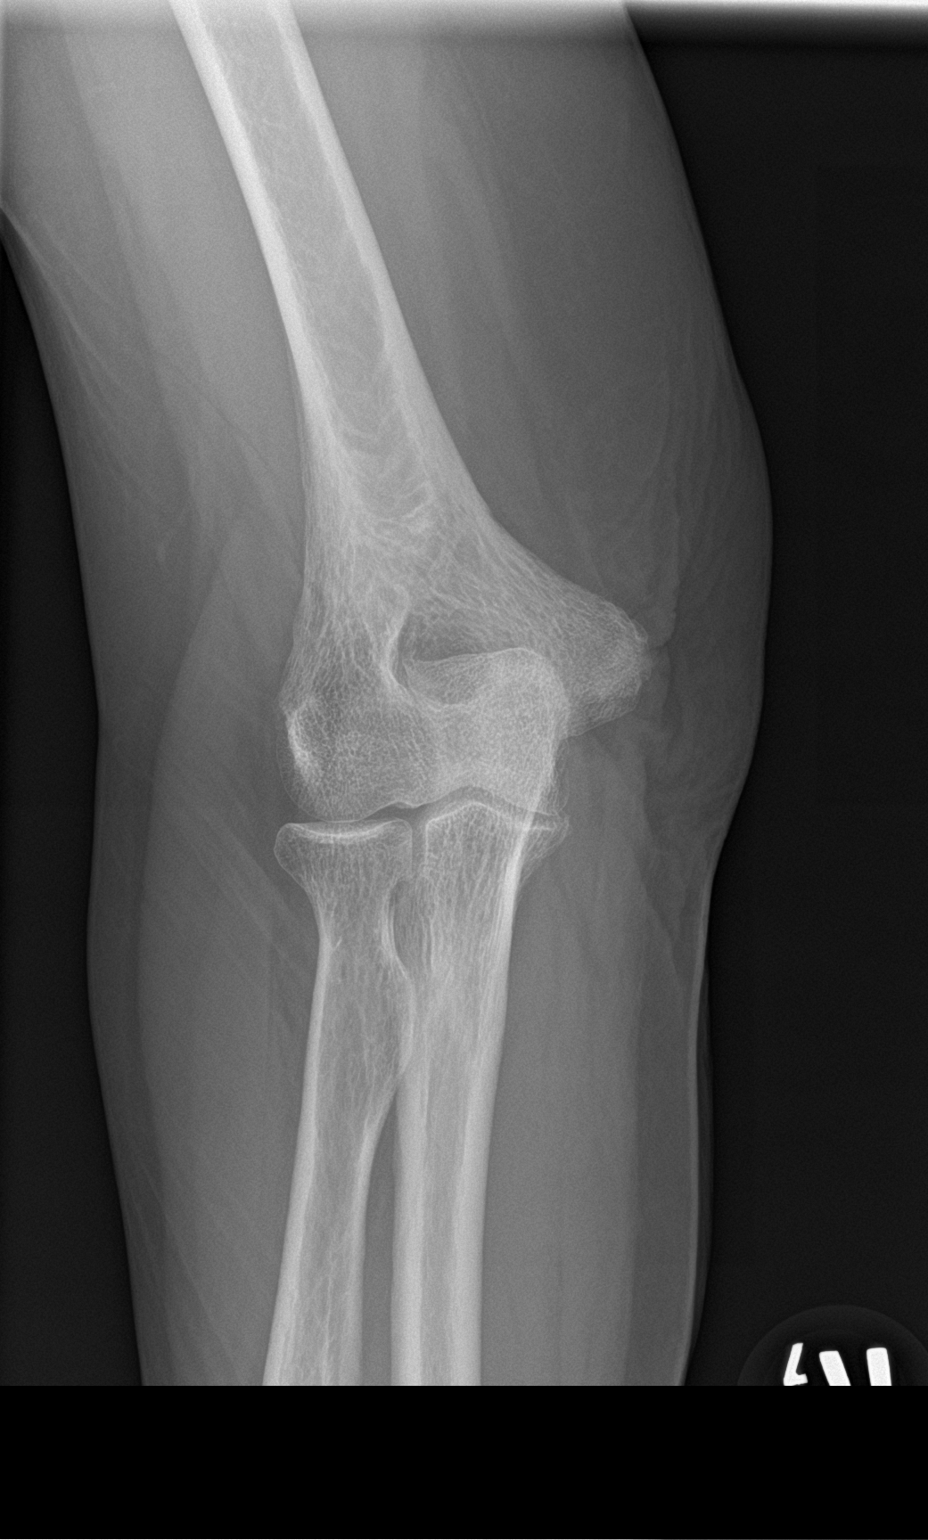
[im 4/4]
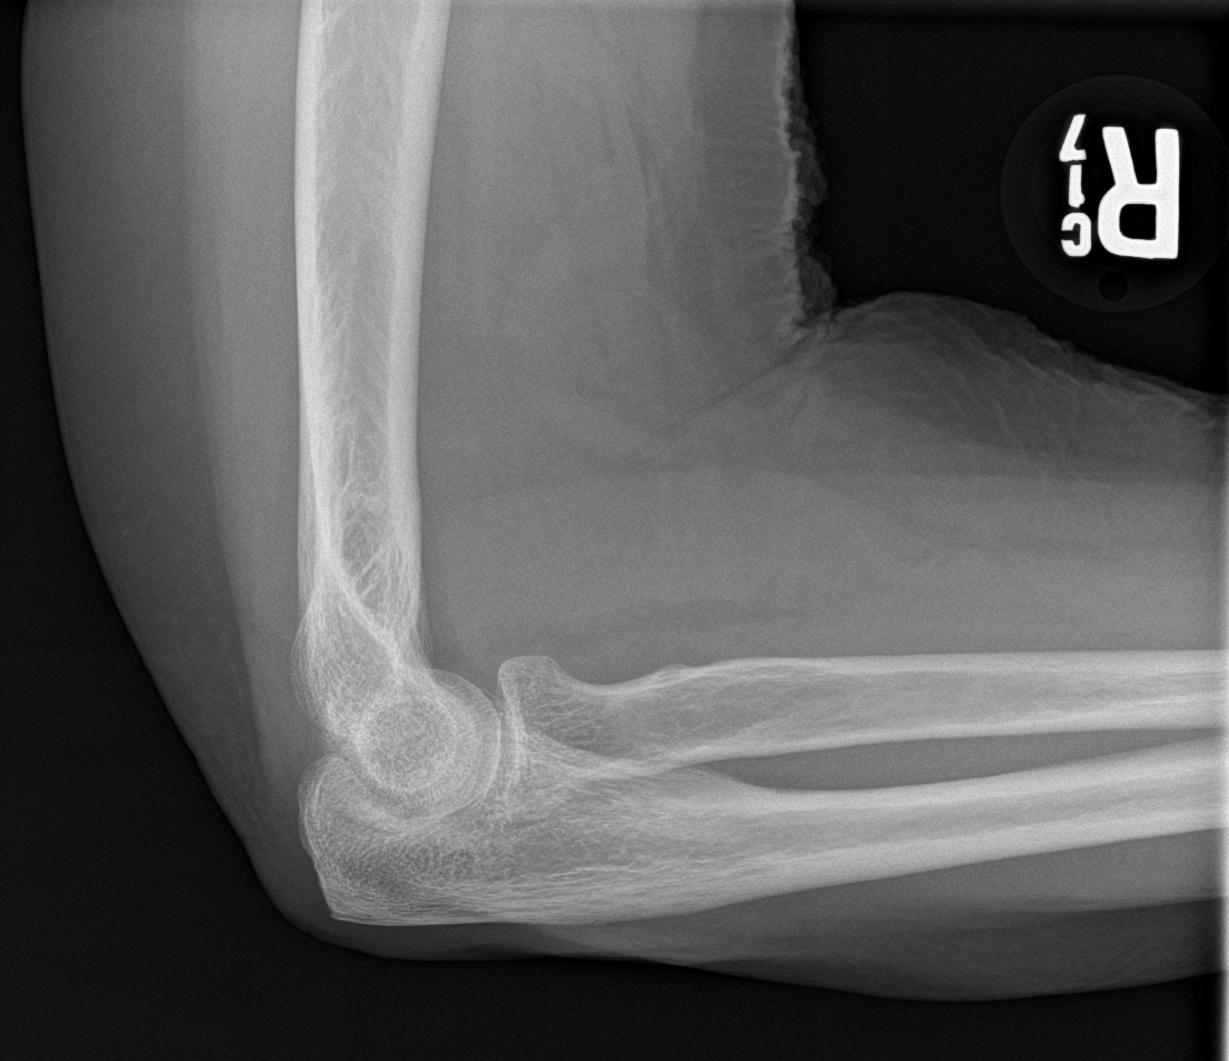

[4 of 4 positions shown; findings below may reference images not displayed]

FINDINGS: There is no evidence of fracture, dislocation, or joint effusion.
There is no evidence of arthropathy or other focal bone abnormality.
Soft tissues are unremarkable.
IMPRESSION: Negative exam.

## 2020-08-05 ENCOUNTER — Ambulatory Visit
Admission: RE | Admit: 2020-08-05 | Discharge: 2020-08-05 | Disposition: A | Discharge status: Home / Self Care | Payer: Medicare HMO | Source: Ambulatory Visit | Attending: Family Medicine | Admitting: Family Medicine

## 2020-08-05 ENCOUNTER — Other Ambulatory Visit: Payer: Self-pay

## 2020-08-05 DIAGNOSIS — Z1231 Encounter for screening mammogram for malignant neoplasm of breast: Secondary | ICD-10-CM | POA: Diagnosis not present

## 2020-08-12 ENCOUNTER — Telehealth: Payer: Self-pay

## 2020-08-12 NOTE — Progress Notes (Signed)
    Chronic Care Management Pharmacy Assistant   Name: Rebecca Patterson  MRN: [id number] DOB: 02-Jan-1935  Reason for Encounter: Medication Review/[place] Adherence Call.   Recent office visits:  No recent Office Visit  Recent consult visits:  07/26/2020 Jennings Books (Neurology) 07/25/2020 Carlis Stable (Internal Medicine) 07/13/2020 Dr. Rogue Bussing MD (Oncology)  06/02/2020 Ree Edman (Dermatology)  Hospital visits:  None in previous 6 months  Medications: Outpatient Encounter Medications as of 08/12/2020  Medication Sig   albuterol (VENTOLIN HFA) 108 (90 Base) MCG/ACT inhaler Inhale 2 puffs into the lungs every 4 (four) hours as needed for wheezing or shortness of breath.   aspirin EC 81 MG tablet Take 81 mg by mouth daily.   carbidopa-levodopa (SINEMET IR) 25-100 MG tablet Take 1.5 tablets by mouth 4 (four) times daily.   Cholecalciferol (EQL VITAMIN D3) 25 MCG (1000 UT) capsule Take 1,000 Units by mouth daily.   docusate sodium (COLACE) 100 MG capsule Take by mouth daily.   famotidine (PEPCID) 20 MG tablet Take 20 mg by mouth as needed.    levothyroxine (SYNTHROID) 50 MCG tablet TAKE 1 TABLET BY MOUTH EVERY DAY   Multiple Vitamins-Minerals (PRESERVISION AREDS 2 PO) Take 1 capsule by mouth 2 (two) times daily.   pravastatin (PRAVACHOL) 40 MG tablet TAKE 1 TABLET BY MOUTH EVERY DAY   sertraline (ZOLOFT) 25 MG tablet Take 25 mg by mouth daily.   vitamin B-12 (CYANOCOBALAMIN) 1000 MCG tablet Take 1,000 mcg by mouth daily.   No facility-administered encounter medications on file as of 08/12/2020.    Care Gaps: Covid 19 vaccine,Urine Microalbumin, Foot exam, and A1C Star Rating Drugs: Pravastatin 40 mg last filled on 05/05/2020 for 90 day supply at [facility].  Called patient and discussed medication adherence  with patient, no issues at this time with current medication.   Patient daughter denies ED visit since her last CPP follow up.  Patient daughter denies any side  effects with her medication. Patient daughter denies any problems with her current pharmacy    Patient Daughter states patient has a new PCP at a different location.Notified Clinical Pharmacist to un enroll patient from CCM service.  [facility] Pharmacist Assistant [phone]

## 2020-09-06 DIAGNOSIS — H353132 Nonexudative age-related macular degeneration, bilateral, intermediate dry stage: Secondary | ICD-10-CM | POA: Diagnosis not present

## 2020-09-06 DIAGNOSIS — Z0100 Encounter for examination of eyes and vision without abnormal findings: Secondary | ICD-10-CM | POA: Diagnosis not present

## 2020-09-19 ENCOUNTER — Encounter: Payer: Medicare Other | Admitting: Family Medicine

## 2020-11-08 ENCOUNTER — Telehealth: Payer: Self-pay

## 2020-12-05 DIAGNOSIS — L910 Hypertrophic scar: Secondary | ICD-10-CM | POA: Diagnosis not present

## 2020-12-05 DIAGNOSIS — L570 Actinic keratosis: Secondary | ICD-10-CM | POA: Diagnosis not present

## 2020-12-05 DIAGNOSIS — Z85828 Personal history of other malignant neoplasm of skin: Secondary | ICD-10-CM | POA: Diagnosis not present

## 2020-12-05 DIAGNOSIS — L578 Other skin changes due to chronic exposure to nonionizing radiation: Secondary | ICD-10-CM | POA: Diagnosis not present

## 2020-12-05 DIAGNOSIS — Z872 Personal history of diseases of the skin and subcutaneous tissue: Secondary | ICD-10-CM | POA: Diagnosis not present

## 2020-12-05 DIAGNOSIS — Z859 Personal history of malignant neoplasm, unspecified: Secondary | ICD-10-CM | POA: Diagnosis not present

## 2020-12-07 DIAGNOSIS — E039 Hypothyroidism, unspecified: Secondary | ICD-10-CM | POA: Diagnosis not present

## 2020-12-07 DIAGNOSIS — E785 Hyperlipidemia, unspecified: Secondary | ICD-10-CM | POA: Diagnosis not present

## 2020-12-07 DIAGNOSIS — G20 Parkinson's disease: Secondary | ICD-10-CM | POA: Diagnosis not present

## 2020-12-07 DIAGNOSIS — Z23 Encounter for immunization: Secondary | ICD-10-CM | POA: Diagnosis not present

## 2020-12-07 DIAGNOSIS — E119 Type 2 diabetes mellitus without complications: Secondary | ICD-10-CM | POA: Diagnosis not present

## 2020-12-07 DIAGNOSIS — G2581 Restless legs syndrome: Secondary | ICD-10-CM | POA: Diagnosis not present

## 2020-12-07 DIAGNOSIS — R221 Localized swelling, mass and lump, neck: Secondary | ICD-10-CM | POA: Diagnosis not present

## 2020-12-07 DIAGNOSIS — Z0000 Encounter for general adult medical examination without abnormal findings: Secondary | ICD-10-CM | POA: Diagnosis not present

## 2020-12-28 DIAGNOSIS — R221 Localized swelling, mass and lump, neck: Secondary | ICD-10-CM | POA: Diagnosis not present

## 2021-01-02 ENCOUNTER — Other Ambulatory Visit: Payer: Self-pay | Admitting: Unknown Physician Specialty

## 2021-01-02 DIAGNOSIS — R221 Localized swelling, mass and lump, neck: Secondary | ICD-10-CM

## 2021-01-03 DIAGNOSIS — I639 Cerebral infarction, unspecified: Secondary | ICD-10-CM | POA: Diagnosis not present

## 2021-01-03 DIAGNOSIS — G20 Parkinson's disease: Secondary | ICD-10-CM | POA: Diagnosis not present

## 2021-01-03 DIAGNOSIS — G2581 Restless legs syndrome: Secondary | ICD-10-CM | POA: Diagnosis not present

## 2021-01-03 DIAGNOSIS — R4189 Other symptoms and signs involving cognitive functions and awareness: Secondary | ICD-10-CM | POA: Diagnosis not present

## 2021-01-10 ENCOUNTER — Ambulatory Visit
Admission: RE | Admit: 2021-01-10 | Discharge: 2021-01-10 | Disposition: A | Discharge status: Home / Self Care | Payer: Medicare HMO | Source: Ambulatory Visit | Attending: Unknown Physician Specialty | Admitting: Unknown Physician Specialty

## 2021-01-10 ENCOUNTER — Other Ambulatory Visit: Payer: Self-pay

## 2021-01-10 DIAGNOSIS — R221 Localized swelling, mass and lump, neck: Secondary | ICD-10-CM

## 2021-01-10 DIAGNOSIS — Z9889 Other specified postprocedural states: Secondary | ICD-10-CM | POA: Diagnosis not present

## 2021-01-10 DIAGNOSIS — D446 Neoplasm of uncertain behavior of carotid body: Secondary | ICD-10-CM | POA: Diagnosis not present

## 2021-01-10 DIAGNOSIS — M47812 Spondylosis without myelopathy or radiculopathy, cervical region: Secondary | ICD-10-CM | POA: Diagnosis not present

## 2021-01-10 MED ORDER — IOPAMIDOL (ISOVUE-300) INJECTION 61%
75.0000 mL | Freq: Once | INTRAVENOUS | Status: AC | PRN
  Administered 2021-01-10: 75 mL via INTRAVENOUS

## 2021-01-19 ENCOUNTER — Other Ambulatory Visit: Payer: Self-pay | Admitting: Family Medicine

## 2021-01-19 NOTE — Telephone Encounter (Signed)
Patient no longer a patient of [facility]. She established care with [facility] on 07/25/2020.

## 2021-02-26 ENCOUNTER — Other Ambulatory Visit: Payer: Self-pay | Admitting: Family Medicine

## 2021-02-26 DIAGNOSIS — E039 Hypothyroidism, unspecified: Secondary | ICD-10-CM

## 2021-02-26 NOTE — Telephone Encounter (Signed)
pt no longer under [facility] care pt has new PCP  Requested Prescriptions  Refused Prescriptions Disp Refills   levothyroxine (SYNTHROID) 50 MCG tablet [Pharmacy Med Name: LEVOTHYROXINE 50 MCG TABLET] 90 tablet 3    Sig: TAKE 1 TABLET BY MOUTH EVERY DAY     Endocrinology:  Hypothyroid Agents Failed - 02/26/2021 11:33 AM      Failed - TSH needs to be rechecked within 3 months after an abnormal result. Refill until TSH is due.      Failed - TSH in normal range and within 360 days    TSH  Date Value Ref Range Status  09/14/2019 0.795 0.450 - 4.500 uIU/mL Final         Failed - Valid encounter within last 12 months    Recent Outpatient Visits          1 year ago Encounter for annual physical exam   [facility], Clearnce Sorrel, PA-C   1 year ago Need for shingles vaccine   [facility], Dionne Bucy, MD   1 year ago Need for shingles vaccine   [facility], Dionne Bucy, MD   1 year ago Type 2 diabetes mellitus without complication, without long-term current use of insulin [facility])   [facility] [place], Dionne Bucy, MD   2 years ago Unintentional weight loss   [facility], Dionne Bucy, MD             Pt has new PCP Adrian Prows MD

## 2021-03-09 DIAGNOSIS — H353132 Nonexudative age-related macular degeneration, bilateral, intermediate dry stage: Secondary | ICD-10-CM | POA: Diagnosis not present

## 2021-03-09 DIAGNOSIS — H532 Diplopia: Secondary | ICD-10-CM | POA: Diagnosis not present

## 2021-04-12 DIAGNOSIS — M2012 Hallux valgus (acquired), left foot: Secondary | ICD-10-CM | POA: Diagnosis not present

## 2021-04-12 DIAGNOSIS — M2042 Other hammer toe(s) (acquired), left foot: Secondary | ICD-10-CM | POA: Diagnosis not present

## 2021-04-12 DIAGNOSIS — M2011 Hallux valgus (acquired), right foot: Secondary | ICD-10-CM | POA: Diagnosis not present

## 2021-04-12 DIAGNOSIS — M898X9 Other specified disorders of bone, unspecified site: Secondary | ICD-10-CM | POA: Diagnosis not present

## 2021-04-12 DIAGNOSIS — R221 Localized swelling, mass and lump, neck: Secondary | ICD-10-CM | POA: Diagnosis not present

## 2021-04-12 DIAGNOSIS — M2041 Other hammer toe(s) (acquired), right foot: Secondary | ICD-10-CM | POA: Diagnosis not present

## 2021-05-03 DIAGNOSIS — G20 Parkinson's disease: Secondary | ICD-10-CM | POA: Diagnosis not present

## 2021-05-03 DIAGNOSIS — R4189 Other symptoms and signs involving cognitive functions and awareness: Secondary | ICD-10-CM | POA: Diagnosis not present

## 2021-05-03 DIAGNOSIS — E559 Vitamin D deficiency, unspecified: Secondary | ICD-10-CM | POA: Diagnosis not present

## 2021-05-03 DIAGNOSIS — G2581 Restless legs syndrome: Secondary | ICD-10-CM | POA: Diagnosis not present

## 2021-05-03 DIAGNOSIS — E119 Type 2 diabetes mellitus without complications: Secondary | ICD-10-CM | POA: Diagnosis not present

## 2021-05-03 DIAGNOSIS — E538 Deficiency of other specified B group vitamins: Secondary | ICD-10-CM | POA: Diagnosis not present

## 2021-05-03 DIAGNOSIS — G7000 Myasthenia gravis without (acute) exacerbation: Secondary | ICD-10-CM | POA: Diagnosis not present

## 2021-05-03 DIAGNOSIS — I639 Cerebral infarction, unspecified: Secondary | ICD-10-CM | POA: Diagnosis not present

## 2021-05-03 DIAGNOSIS — E038 Other specified hypothyroidism: Secondary | ICD-10-CM | POA: Diagnosis not present

## 2021-06-07 DIAGNOSIS — E785 Hyperlipidemia, unspecified: Secondary | ICD-10-CM | POA: Diagnosis not present

## 2021-06-07 DIAGNOSIS — E119 Type 2 diabetes mellitus without complications: Secondary | ICD-10-CM | POA: Diagnosis not present

## 2021-06-07 DIAGNOSIS — E890 Postprocedural hypothyroidism: Secondary | ICD-10-CM | POA: Diagnosis not present

## 2021-06-07 DIAGNOSIS — G2581 Restless legs syndrome: Secondary | ICD-10-CM | POA: Diagnosis not present

## 2021-06-07 DIAGNOSIS — C50919 Malignant neoplasm of unspecified site of unspecified female breast: Secondary | ICD-10-CM | POA: Diagnosis not present

## 2021-06-07 DIAGNOSIS — G20 Parkinson's disease: Secondary | ICD-10-CM | POA: Diagnosis not present

## 2021-06-22 ENCOUNTER — Other Ambulatory Visit: Payer: Self-pay | Admitting: Infectious Diseases

## 2021-06-22 DIAGNOSIS — Z1231 Encounter for screening mammogram for malignant neoplasm of breast: Secondary | ICD-10-CM

## 2021-06-28 DIAGNOSIS — I8393 Asymptomatic varicose veins of bilateral lower extremities: Secondary | ICD-10-CM | POA: Diagnosis not present

## 2021-07-12 DIAGNOSIS — G20 Parkinson's disease: Secondary | ICD-10-CM | POA: Diagnosis not present

## 2021-07-12 DIAGNOSIS — E785 Hyperlipidemia, unspecified: Secondary | ICD-10-CM | POA: Diagnosis not present

## 2021-07-12 DIAGNOSIS — E538 Deficiency of other specified B group vitamins: Secondary | ICD-10-CM | POA: Diagnosis not present

## 2021-07-12 DIAGNOSIS — M62838 Other muscle spasm: Secondary | ICD-10-CM | POA: Diagnosis not present

## 2021-07-12 DIAGNOSIS — R32 Unspecified urinary incontinence: Secondary | ICD-10-CM | POA: Diagnosis not present

## 2021-07-12 DIAGNOSIS — K219 Gastro-esophageal reflux disease without esophagitis: Secondary | ICD-10-CM | POA: Diagnosis not present

## 2021-07-12 DIAGNOSIS — R69 Illness, unspecified: Secondary | ICD-10-CM | POA: Diagnosis not present

## 2021-07-12 DIAGNOSIS — Z7982 Long term (current) use of aspirin: Secondary | ICD-10-CM | POA: Diagnosis not present

## 2021-07-12 DIAGNOSIS — E039 Hypothyroidism, unspecified: Secondary | ICD-10-CM | POA: Diagnosis not present

## 2021-07-12 DIAGNOSIS — M1990 Unspecified osteoarthritis, unspecified site: Secondary | ICD-10-CM | POA: Diagnosis not present

## 2021-07-12 DIAGNOSIS — Z008 Encounter for other general examination: Secondary | ICD-10-CM | POA: Diagnosis not present

## 2021-07-12 DIAGNOSIS — J45909 Unspecified asthma, uncomplicated: Secondary | ICD-10-CM | POA: Diagnosis not present

## 2021-07-13 ENCOUNTER — Inpatient Hospital Stay: Payer: Medicare HMO | Admitting: Internal Medicine

## 2021-07-13 ENCOUNTER — Other Ambulatory Visit: Payer: Self-pay

## 2021-07-13 ENCOUNTER — Encounter: Payer: Self-pay | Admitting: Internal Medicine

## 2021-07-13 ENCOUNTER — Inpatient Hospital Stay: Payer: Medicare HMO | Attending: Internal Medicine

## 2021-07-13 DIAGNOSIS — M810 Age-related osteoporosis without current pathological fracture: Secondary | ICD-10-CM

## 2021-07-13 DIAGNOSIS — Z853 Personal history of malignant neoplasm of breast: Secondary | ICD-10-CM | POA: Insufficient documentation

## 2021-07-13 DIAGNOSIS — M8580 Other specified disorders of bone density and structure, unspecified site: Secondary | ICD-10-CM | POA: Diagnosis not present

## 2021-07-13 DIAGNOSIS — Z170 Estrogen receptor positive status [ER+]: Secondary | ICD-10-CM

## 2021-07-13 DIAGNOSIS — C50811 Malignant neoplasm of overlapping sites of right female breast: Secondary | ICD-10-CM | POA: Diagnosis not present

## 2021-07-13 LAB — CBC WITH DIFFERENTIAL/PLATELET
Abs Immature Granulocytes: 0.01 10*3/uL (ref 0.00–0.07)
Basophils Absolute: 0 10*3/uL (ref 0.0–0.1)
Basophils Relative: 1 %
Eosinophils Absolute: 0.1 10*3/uL (ref 0.0–0.5)
Eosinophils Relative: 1 %
HCT: 37.9 % (ref 36.0–46.0)
Hemoglobin: 12.7 g/dL (ref 12.0–15.0)
Immature Granulocytes: 0 %
Lymphocytes Relative: 41 %
Lymphs Abs: 2 10*3/uL (ref 0.7–4.0)
MCH: 31.2 pg (ref 26.0–34.0)
MCHC: 33.5 g/dL (ref 30.0–36.0)
MCV: 93.1 fL (ref 80.0–100.0)
Monocytes Absolute: 0.6 10*3/uL (ref 0.1–1.0)
Monocytes Relative: 11 %
Neutro Abs: 2.3 10*3/uL (ref 1.7–7.7)
Neutrophils Relative %: 46 %
Platelets: 208 10*3/uL (ref 150–400)
RBC: 4.07 MIL/uL (ref 3.87–5.11)
RDW: 12.8 % (ref 11.5–15.5)
WBC: 5 10*3/uL (ref 4.0–10.5)
nRBC: 0 % (ref 0.0–0.2)

## 2021-07-13 LAB — COMPREHENSIVE METABOLIC PANEL
ALT: <5 U/L (ref 0–44)
AST: 16 U/L (ref 15–41)
Albumin: 4.3 g/dL (ref 3.5–5.0)
Alkaline Phosphatase: 62 U/L (ref 38–126)
Anion gap: 5 (ref 5–15)
BUN: 20 mg/dL (ref 8–23)
CO2: 31 mmol/L (ref 22–32)
Calcium: 8.6 mg/dL — ABNORMAL LOW (ref 8.9–10.3)
Chloride: 103 mmol/L (ref 98–111)
Creatinine, Ser: 0.74 mg/dL (ref 0.44–1.00)
GFR, Estimated: >60 mL/min (ref >60)
Glucose, Bld: 124 mg/dL — ABNORMAL HIGH (ref 70–99)
Potassium: 4.4 mmol/L (ref 3.5–5.1)
Sodium: 139 mmol/L (ref 135–145)
Total Bilirubin: 0.9 mg/dL (ref 0.3–1.2)
Total Protein: 6.9 g/dL (ref 6.5–8.1)

## 2021-07-13 NOTE — Progress Notes (Signed)
[facility] OFFICE PROGRESS NOTE  Patient Care Team: Leonel Ramsay, MD as PCP - General (Infectious Diseases) Leandrew Koyanagi, MD as Referring Physician (Ophthalmology) Cammie Sickle, MD as Consulting Physician (Internal Medicine) Isaias Cowman, MD as Consulting Physician (Cardiology) Flora Lipps, MD as Consulting Physician (Pulmonary Disease) Vertell Limber (Neurology) Vladimir Crofts, MD as Consulting Physician (Neurology)   Cancer Staging  No matching staging information was found for the patient.   Oncology History Overview Note  # 2013- Stage IA (pT1b pN0 (sn) cM0) grade 1 invasive mammary carcinoma of the right breast status post wide local excision and sentinel node biopsy on 04/05/11.; Tumor size 9 mm, grade 1, margins uninvolved by invasive carcinoma.  No DCIS.  1 sentinel lymph node negative;  ER/PR positive (90%),  HER-2/neu negative by FISH (HER2/CEP17 ratio 1.04). S/p RT; no chemo.   # Patient started anastrozole March 2013, changed to exemestane May 2013 due to side effects [until May 2018]  # OSTEOPOROSIS- Feb 2019- ca+vit D: Intol to oral Bisphosphonates; Reluctant to reclast/Prolia.   DIAGNOSIS: breast cancer  STAGE:   I      ;GOALS: cure  CURRENT/MOST RECENT THERAPY: surveillaince    Carcinoma of overlapping sites of right breast in female, estrogen receptor positive ([place])      INTERVAL HISTORY:  Rebecca Patterson 87 y.o.  female pleasant patient above history of stage I breast cancer ER PR positive HER-2/neu negative-status post aromatase inhibitor; finished May 2018 is here for follow-up.  Denies any new lumps or bumps.  Appetite is good.  No weight loss.  No nausea no vomiting.  No bone pain.    Review of Systems  Constitutional:  Negative for chills, diaphoresis, fever, malaise/fatigue and weight loss.  HENT:  Negative for nosebleeds and sore throat.   Eyes:  Negative for double vision.  Respiratory:   Negative for cough, hemoptysis, sputum production, shortness of breath and wheezing.   Cardiovascular:  Negative for chest pain, palpitations, orthopnea and leg swelling.  Gastrointestinal:  Negative for abdominal pain, blood in stool, constipation, diarrhea, heartburn, melena, nausea and vomiting.  Genitourinary:  Negative for dysuria, frequency and urgency.  Musculoskeletal:  Negative for back pain and joint pain.  Skin: Negative.  Negative for itching and rash.  Neurological:  Negative for dizziness, tingling, focal weakness, weakness and headaches.  Endo/Heme/Allergies:  Does not bruise/bleed easily.  Psychiatric/Behavioral:  Negative for depression. The patient is not nervous/anxious and does not have insomnia.       PAST MEDICAL HISTORY :  Past Medical History:  Diagnosis Date  . Allergy   . Breast cancer, right ([place]) 2013   Lumpectomy with Mammosite radiation and Tamoxifen  . Complication of anesthesia   . Corn   . Family history of adverse reaction to anesthesia    sister - slow to wake  . Fibrocystic breast   . GERD (gastroesophageal reflux disease)   . 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 Hyperlipidemia   . Osteoporosis   . Osteoporosis   . Personal history of radiation therapy   . PONV (postoperative nausea and vomiting)   . Thyroid disease   . Vertigo    long ago  . Wears dentures    full upper and lower  . Wears hearing aid    bilateral    PAST SURGICAL HISTORY :   Past Surgical History:  Procedure Laterality Date  . ABDOMINAL HYSTERECTOMY    . APPENDECTOMY    .  BREAST BIOPSY Right 2013   Stereo- Positive  . BREAST CYST EXCISION Right n/a   cyst removed  . BREAST LUMPECTOMY Right    2013  . BUNIONECTOMY Bilateral 2004  . 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 CATARACT EXTRACTION W/PHACO Right 11/10/2014   Procedure: CATARACT EXTRACTION PHACO AND INTRAOCULAR LENS PLACEMENT (IOC);  Surgeon: Leandrew Koyanagi, MD;  Location: [facility];  Service: Ophthalmology;  Laterality: Right;  . CHOLECYSTECTOMY    . ESOPHAGOGASTRODUODENOSCOPY (EGD) WITH PROPOFOL N/A 04/23/2018   Procedure: ESOPHAGOGASTRODUODENOSCOPY (EGD) WITH PROPOFOL;  Surgeon: Virgel Manifold, MD;  Location: [facility];  Service: Endoscopy;  Laterality: N/A;  pt prefers later in am  . KNEE SURGERY Right 2005  . Mastoid surgery Right   . SKIN CANCER EXCISION  03/17/2014   right side of face Dr. Aubery Lapping  . THYROID SURGERY N/A 1980   Goiter    FAMILY HISTORY :   Family History  Problem Relation Age of Onset  . Stroke Sister   . Breast cancer Sister 62  . Breast cancer Other   . Colon cancer Neg Hx     SOCIAL HISTORY:   Social History   Tobacco Use  . Smoking status: Never  . Smokeless tobacco: Never  Vaping Use  . Vaping Use: Never used  Substance Use Topics  . Alcohol use: No  . Drug use: No    ALLERGIES:  is allergic to actonel [risedronate], boniva [ibandronic acid], and fosamax [alendronate].  MEDICATIONS:  Current Outpatient Medications  Medication Sig Dispense Refill  . albuterol (VENTOLIN HFA) 108 (90 Base) MCG/ACT inhaler Inhale 2 puffs into the lungs every 4 (four) hours as needed for wheezing or shortness of breath. 1 g 6  . aspirin EC 81 MG tablet Take 81 mg by mouth daily.    . carbidopa-levodopa (SINEMET IR) 25-100 MG tablet Take 1.5 tablets by mouth 4 (four) times daily.    . Cholecalciferol (EQL VITAMIN D3) 25 MCG (1000 UT) capsule Take 1,000 Units by mouth daily.    Marland Kitchen docusate sodium (COLACE) 100 MG capsule Take by mouth daily.    . famotidine (PEPCID) 20 MG tablet Take 20 mg by mouth as needed.     Marland Kitchen levothyroxine (SYNTHROID) 50 MCG tablet TAKE 1 TABLET BY MOUTH EVERY DAY 90 tablet 3  . Multiple Vitamins-Minerals (PRESERVISION AREDS 2 PO) Take 1 capsule by mouth 2 (two) times daily.    . pravastatin (PRAVACHOL) 40 MG tablet TAKE 1 TABLET BY MOUTH EVERY DAY 90 tablet 3  . rivastigmine (EXELON) 1.5 MG capsule  Take by mouth.    . sertraline (ZOLOFT) 25 MG tablet Take 25 mg by mouth daily.    . vitamin B-12 (CYANOCOBALAMIN) 1000 MCG tablet Take 1,000 mcg by mouth daily.    . baclofen (LIORESAL) 10 MG tablet Take 10 mg by mouth at bedtime.    . Magnesium Oxide 500 MG TABS Take by mouth.     No current facility-administered medications for this visit.    PHYSICAL EXAMINATION: ECOG PERFORMANCE STATUS: 0 - Asymptomatic  Ht 5' (1.524 m)   BMI 25.00 kg/m   There were no vitals filed for this visit.   Physical Exam Constitutional:      Comments: Frail-appearing Caucasian female patient.  Accompanied by daughter.  Ambulating independently.  HENT:     Head: Normocephalic and atraumatic.     Mouth/Throat:     Pharynx: No oropharyngeal exudate.  Eyes:     Pupils: Pupils  are equal, round, and reactive to light.  Cardiovascular:     Rate and Rhythm: Normal rate and regular rhythm.  Pulmonary:     Effort: Pulmonary effort is normal. No respiratory distress.     Breath sounds: Normal breath sounds. No wheezing.  Abdominal:     General: Bowel sounds are normal. There is no distension.     Palpations: Abdomen is soft. There is no mass.     Tenderness: There is no abdominal tenderness. There is no guarding or rebound.  Musculoskeletal:        General: No tenderness. Normal range of motion.     Cervical back: Normal range of motion and neck supple.  Skin:    General: Skin is warm.  Neurological:     Mental Status: She is alert and oriented to person, place, and time.  Psychiatric:        Mood and Affect: Affect normal.     LABORATORY DATA:  I have reviewed the data as listed    Component Value Date/Time   NA 139 07/13/2021 1036   NA 141 03/12/2019 1204   K 4.4 07/13/2021 1036   CL 103 07/13/2021 1036   CO2 31 07/13/2021 1036   GLUCOSE 124 (H) 07/13/2021 1036   BUN 20 07/13/2021 1036   BUN 16 03/12/2019 1204   CREATININE 0.74 07/13/2021 1036   CREATININE 0.85 11/04/2013 1029    CALCIUM 8.6 (L) 07/13/2021 1036   PROT 6.9 07/13/2021 1036   PROT 6.5 03/12/2019 1204   PROT 6.9 11/04/2013 1029   ALBUMIN 4.3 07/13/2021 1036   ALBUMIN 4.6 03/12/2019 1204   ALBUMIN 3.9 11/04/2013 1029   AST 16 07/13/2021 1036   AST 9 (L) 11/04/2013 1029   ALT PENDING 07/13/2021 1036   ALT 29 11/04/2013 1029   ALKPHOS 62 07/13/2021 1036   ALKPHOS 77 11/04/2013 1029   BILITOT 0.9 07/13/2021 1036   BILITOT 0.6 03/12/2019 1204   BILITOT 0.3 11/04/2013 1029   GFRNONAA >60 07/13/2021 1036   GFRNONAA >60 11/04/2013 1029   GFRNONAA >60 11/03/2012 1106   GFRAA >60 06/16/2019 1324   GFRAA >60 11/04/2013 1029   GFRAA >60 11/03/2012 1106    No results found for: "SPEP", "UPEP"  Lab Results  Component Value Date   WBC 5.0 07/13/2021   NEUTROABS 2.3 07/13/2021   HGB 12.7 07/13/2021   HCT 37.9 07/13/2021   MCV 93.1 07/13/2021   PLT 208 07/13/2021      Chemistry      Component Value Date/Time   NA 139 07/13/2021 1036   NA 141 03/12/2019 1204   K 4.4 07/13/2021 1036   CL 103 07/13/2021 1036   CO2 31 07/13/2021 1036   BUN 20 07/13/2021 1036   BUN 16 03/12/2019 1204   CREATININE 0.74 07/13/2021 1036   CREATININE 0.85 11/04/2013 1029   GLU 101 07/01/2013 0000      Component Value Date/Time   CALCIUM 8.6 (L) 07/13/2021 1036   ALKPHOS 62 07/13/2021 1036   ALKPHOS 77 11/04/2013 1029   AST 16 07/13/2021 1036   AST 9 (L) 11/04/2013 1029   ALT PENDING 07/13/2021 1036   ALT 29 11/04/2013 1029   BILITOT 0.9 07/13/2021 1036   BILITOT 0.6 03/12/2019 1204   BILITOT 0.3 11/04/2013 1029       RADIOGRAPHIC STUDIES: I have personally reviewed the radiological images as listed and agreed with the findings in the report. No results found.   ASSESSMENT & PLAN:  No  problem-specific Assessment & Plan notes found for this encounter.   No orders of the defined types were placed in this encounter.  All questions were answered. The patient knows to call the clinic with any problems,  questions or concerns.      Cammie Sickle, MD 07/13/2021 11:16 AM

## 2021-07-13 NOTE — Assessment & Plan Note (Addendum)
#  Stage I breast cancer-status post adjuvant Aromasin; finished May 2018- STABLE.   # No evidence of recurrence.  Continue surveillance; JULY 2022- WNL; awaiting bilateral mammogram August 05, 2021.  # BMD June 2021- 1.8/osteopenia; improved compared to 2019.  Continue calcium [see below] plus vitamin D 2000 units. Repeat BMD   # Mild hypocalcemia- 8.6; ADD ca 500 mg to Vit D.   #Since patient is clinically stable I think is reasonable for the patient to follow-up with PCP/can follow-up with [place] as needed.  Patient comfortable with the plan; to call us if any questions or concerns in the interim.  Will call daughter-- julia re; BMD/mammo # DISPOSITION:   # order BMD- with mammogram # follow up as needed- Dr.B

## 2021-08-22 DIAGNOSIS — M2041 Other hammer toe(s) (acquired), right foot: Secondary | ICD-10-CM | POA: Diagnosis not present

## 2021-08-22 DIAGNOSIS — G20 Parkinson's disease: Secondary | ICD-10-CM | POA: Diagnosis not present

## 2021-08-22 DIAGNOSIS — E119 Type 2 diabetes mellitus without complications: Secondary | ICD-10-CM | POA: Diagnosis not present

## 2021-08-22 DIAGNOSIS — M2042 Other hammer toe(s) (acquired), left foot: Secondary | ICD-10-CM | POA: Diagnosis not present

## 2021-08-22 DIAGNOSIS — M2012 Hallux valgus (acquired), left foot: Secondary | ICD-10-CM | POA: Diagnosis not present

## 2021-08-22 DIAGNOSIS — M898X9 Other specified disorders of bone, unspecified site: Secondary | ICD-10-CM | POA: Diagnosis not present

## 2021-08-22 DIAGNOSIS — M2011 Hallux valgus (acquired), right foot: Secondary | ICD-10-CM | POA: Diagnosis not present

## 2021-09-07 ENCOUNTER — Ambulatory Visit
Admission: RE | Admit: 2021-09-07 | Discharge: 2021-09-07 | Disposition: A | Discharge status: Home / Self Care | Payer: Medicare HMO | Source: Ambulatory Visit | Attending: Internal Medicine | Admitting: Internal Medicine

## 2021-09-07 ENCOUNTER — Telehealth: Payer: Self-pay | Admitting: Internal Medicine

## 2021-09-07 ENCOUNTER — Encounter: Payer: Self-pay | Admitting: Internal Medicine

## 2021-09-07 ENCOUNTER — Ambulatory Visit
Admission: RE | Admit: 2021-09-07 | Discharge: 2021-09-07 | Disposition: A | Discharge status: Home / Self Care | Payer: Medicare HMO | Source: Ambulatory Visit | Attending: Infectious Diseases | Admitting: Infectious Diseases

## 2021-09-07 DIAGNOSIS — Z780 Asymptomatic menopausal state: Secondary | ICD-10-CM | POA: Diagnosis not present

## 2021-09-07 DIAGNOSIS — M8589 Other specified disorders of bone density and structure, multiple sites: Secondary | ICD-10-CM | POA: Diagnosis not present

## 2021-09-07 DIAGNOSIS — Z1231 Encounter for screening mammogram for malignant neoplasm of breast: Secondary | ICD-10-CM | POA: Insufficient documentation

## 2021-09-07 DIAGNOSIS — M810 Age-related osteoporosis without current pathological fracture: Secondary | ICD-10-CM | POA: Diagnosis not present

## 2021-09-07 NOTE — Telephone Encounter (Signed)
MyChart message sent with regards to results of the bone density test. GB

## 2021-09-07 NOTE — Telephone Encounter (Signed)
x

## 2021-10-06 DIAGNOSIS — E785 Hyperlipidemia, unspecified: Secondary | ICD-10-CM | POA: Diagnosis not present

## 2021-10-06 DIAGNOSIS — F419 Anxiety disorder, unspecified: Secondary | ICD-10-CM | POA: Diagnosis not present

## 2021-10-06 DIAGNOSIS — K5900 Constipation, unspecified: Secondary | ICD-10-CM | POA: Diagnosis not present

## 2021-10-06 DIAGNOSIS — G20 Parkinson's disease: Secondary | ICD-10-CM | POA: Diagnosis not present

## 2021-10-06 DIAGNOSIS — H3530 Unspecified macular degeneration: Secondary | ICD-10-CM | POA: Diagnosis not present

## 2021-10-06 DIAGNOSIS — F325 Major depressive disorder, single episode, in full remission: Secondary | ICD-10-CM | POA: Diagnosis not present

## 2021-10-06 DIAGNOSIS — E039 Hypothyroidism, unspecified: Secondary | ICD-10-CM | POA: Diagnosis not present

## 2021-10-06 DIAGNOSIS — K219 Gastro-esophageal reflux disease without esophagitis: Secondary | ICD-10-CM | POA: Diagnosis not present

## 2021-11-02 DIAGNOSIS — G20 Parkinson's disease: Secondary | ICD-10-CM | POA: Diagnosis not present

## 2021-11-02 DIAGNOSIS — E559 Vitamin D deficiency, unspecified: Secondary | ICD-10-CM | POA: Diagnosis not present

## 2021-11-02 DIAGNOSIS — R4189 Other symptoms and signs involving cognitive functions and awareness: Secondary | ICD-10-CM | POA: Diagnosis not present

## 2021-11-28 DIAGNOSIS — E119 Type 2 diabetes mellitus without complications: Secondary | ICD-10-CM | POA: Diagnosis not present

## 2021-11-28 DIAGNOSIS — H353133 Nonexudative age-related macular degeneration, bilateral, advanced atrophic without subfoveal involvement: Secondary | ICD-10-CM | POA: Diagnosis not present

## 2021-11-28 DIAGNOSIS — H532 Diplopia: Secondary | ICD-10-CM | POA: Diagnosis not present

## 2021-11-28 DIAGNOSIS — Z0100 Encounter for examination of eyes and vision without abnormal findings: Secondary | ICD-10-CM | POA: Diagnosis not present

## 2021-12-04 ENCOUNTER — Encounter (INDEPENDENT_AMBULATORY_CARE_PROVIDER_SITE_OTHER): Payer: Self-pay

## 2021-12-05 DIAGNOSIS — L298 Other pruritus: Secondary | ICD-10-CM | POA: Diagnosis not present

## 2021-12-05 DIAGNOSIS — C44219 Basal cell carcinoma of skin of left ear and external auricular canal: Secondary | ICD-10-CM | POA: Diagnosis not present

## 2021-12-05 DIAGNOSIS — D485 Neoplasm of uncertain behavior of skin: Secondary | ICD-10-CM | POA: Diagnosis not present

## 2021-12-05 DIAGNOSIS — Z85828 Personal history of other malignant neoplasm of skin: Secondary | ICD-10-CM | POA: Diagnosis not present

## 2021-12-05 DIAGNOSIS — Z872 Personal history of diseases of the skin and subcutaneous tissue: Secondary | ICD-10-CM | POA: Diagnosis not present

## 2021-12-05 DIAGNOSIS — Z859 Personal history of malignant neoplasm, unspecified: Secondary | ICD-10-CM | POA: Diagnosis not present

## 2021-12-05 DIAGNOSIS — C4441 Basal cell carcinoma of skin of scalp and neck: Secondary | ICD-10-CM | POA: Diagnosis not present

## 2021-12-05 DIAGNOSIS — L578 Other skin changes due to chronic exposure to nonionizing radiation: Secondary | ICD-10-CM | POA: Diagnosis not present

## 2021-12-05 DIAGNOSIS — L821 Other seborrheic keratosis: Secondary | ICD-10-CM | POA: Diagnosis not present

## 2021-12-05 DIAGNOSIS — L570 Actinic keratosis: Secondary | ICD-10-CM | POA: Diagnosis not present

## 2021-12-11 DIAGNOSIS — G2581 Restless legs syndrome: Secondary | ICD-10-CM | POA: Diagnosis not present

## 2021-12-11 DIAGNOSIS — Z23 Encounter for immunization: Secondary | ICD-10-CM | POA: Diagnosis not present

## 2021-12-11 DIAGNOSIS — Z853 Personal history of malignant neoplasm of breast: Secondary | ICD-10-CM | POA: Diagnosis not present

## 2021-12-11 DIAGNOSIS — E785 Hyperlipidemia, unspecified: Secondary | ICD-10-CM | POA: Diagnosis not present

## 2021-12-11 DIAGNOSIS — G20A1 Parkinson's disease without dyskinesia, without mention of fluctuations: Secondary | ICD-10-CM | POA: Diagnosis not present

## 2021-12-11 DIAGNOSIS — E119 Type 2 diabetes mellitus without complications: Secondary | ICD-10-CM | POA: Diagnosis not present

## 2021-12-11 DIAGNOSIS — M79652 Pain in left thigh: Secondary | ICD-10-CM | POA: Diagnosis not present

## 2021-12-11 DIAGNOSIS — E039 Hypothyroidism, unspecified: Secondary | ICD-10-CM | POA: Diagnosis not present

## 2021-12-11 DIAGNOSIS — Z0000 Encounter for general adult medical examination without abnormal findings: Secondary | ICD-10-CM | POA: Diagnosis not present

## 2021-12-14 DIAGNOSIS — M79652 Pain in left thigh: Secondary | ICD-10-CM | POA: Diagnosis not present

## 2021-12-14 DIAGNOSIS — S76912A Strain of unspecified muscles, fascia and tendons at thigh level, left thigh, initial encounter: Secondary | ICD-10-CM | POA: Diagnosis not present

## 2021-12-25 DIAGNOSIS — C44219 Basal cell carcinoma of skin of left ear and external auricular canal: Secondary | ICD-10-CM | POA: Diagnosis not present

## 2021-12-25 DIAGNOSIS — C44212 Basal cell carcinoma of skin of right ear and external auricular canal: Secondary | ICD-10-CM | POA: Diagnosis not present

## 2022-01-11 DIAGNOSIS — C44212 Basal cell carcinoma of skin of right ear and external auricular canal: Secondary | ICD-10-CM | POA: Diagnosis not present

## 2022-01-25 DIAGNOSIS — C44219 Basal cell carcinoma of skin of left ear and external auricular canal: Secondary | ICD-10-CM | POA: Diagnosis not present

## 2022-01-25 DIAGNOSIS — Z48817 Encounter for surgical aftercare following surgery on the skin and subcutaneous tissue: Secondary | ICD-10-CM | POA: Diagnosis not present

## 2022-01-25 DIAGNOSIS — Z4801 Encounter for change or removal of surgical wound dressing: Secondary | ICD-10-CM | POA: Diagnosis not present

## 2022-03-14 DIAGNOSIS — L821 Other seborrheic keratosis: Secondary | ICD-10-CM | POA: Diagnosis not present

## 2022-03-14 DIAGNOSIS — L905 Scar conditions and fibrosis of skin: Secondary | ICD-10-CM | POA: Diagnosis not present

## 2022-03-26 DIAGNOSIS — G2581 Restless legs syndrome: Secondary | ICD-10-CM | POA: Diagnosis not present

## 2022-03-26 DIAGNOSIS — E785 Hyperlipidemia, unspecified: Secondary | ICD-10-CM | POA: Diagnosis not present

## 2022-03-26 DIAGNOSIS — E119 Type 2 diabetes mellitus without complications: Secondary | ICD-10-CM | POA: Diagnosis not present

## 2022-03-26 DIAGNOSIS — E890 Postprocedural hypothyroidism: Secondary | ICD-10-CM | POA: Diagnosis not present

## 2022-03-26 DIAGNOSIS — G20A1 Parkinson's disease without dyskinesia, without mention of fluctuations: Secondary | ICD-10-CM | POA: Diagnosis not present

## 2022-03-26 DIAGNOSIS — Z853 Personal history of malignant neoplasm of breast: Secondary | ICD-10-CM | POA: Diagnosis not present

## 2022-04-05 DIAGNOSIS — L298 Other pruritus: Secondary | ICD-10-CM | POA: Diagnosis not present

## 2022-04-05 DIAGNOSIS — L570 Actinic keratosis: Secondary | ICD-10-CM | POA: Diagnosis not present

## 2022-04-05 DIAGNOSIS — L821 Other seborrheic keratosis: Secondary | ICD-10-CM | POA: Diagnosis not present

## 2022-04-30 DIAGNOSIS — E049 Nontoxic goiter, unspecified: Secondary | ICD-10-CM | POA: Diagnosis not present

## 2022-04-30 DIAGNOSIS — E039 Hypothyroidism, unspecified: Secondary | ICD-10-CM | POA: Diagnosis not present

## 2022-04-30 DIAGNOSIS — E559 Vitamin D deficiency, unspecified: Secondary | ICD-10-CM | POA: Diagnosis not present

## 2022-05-03 DIAGNOSIS — G2581 Restless legs syndrome: Secondary | ICD-10-CM | POA: Diagnosis not present

## 2022-05-03 DIAGNOSIS — R4189 Other symptoms and signs involving cognitive functions and awareness: Secondary | ICD-10-CM | POA: Diagnosis not present

## 2022-05-03 DIAGNOSIS — H532 Diplopia: Secondary | ICD-10-CM | POA: Diagnosis not present

## 2022-05-03 DIAGNOSIS — G20A1 Parkinson's disease without dyskinesia, without mention of fluctuations: Secondary | ICD-10-CM | POA: Diagnosis not present

## 2022-05-23 DIAGNOSIS — M8588 Other specified disorders of bone density and structure, other site: Secondary | ICD-10-CM | POA: Diagnosis not present

## 2022-05-23 DIAGNOSIS — M4316 Spondylolisthesis, lumbar region: Secondary | ICD-10-CM | POA: Diagnosis not present

## 2022-05-23 DIAGNOSIS — M545 Low back pain, unspecified: Secondary | ICD-10-CM | POA: Diagnosis not present

## 2022-05-31 DIAGNOSIS — H353133 Nonexudative age-related macular degeneration, bilateral, advanced atrophic without subfoveal involvement: Secondary | ICD-10-CM | POA: Diagnosis not present

## 2022-05-31 DIAGNOSIS — Z961 Presence of intraocular lens: Secondary | ICD-10-CM | POA: Diagnosis not present

## 2022-05-31 DIAGNOSIS — Z0100 Encounter for examination of eyes and vision without abnormal findings: Secondary | ICD-10-CM | POA: Diagnosis not present

## 2022-05-31 DIAGNOSIS — H43813 Vitreous degeneration, bilateral: Secondary | ICD-10-CM | POA: Diagnosis not present

## 2022-06-04 DIAGNOSIS — E119 Type 2 diabetes mellitus without complications: Secondary | ICD-10-CM | POA: Diagnosis not present

## 2022-06-04 DIAGNOSIS — M5416 Radiculopathy, lumbar region: Secondary | ICD-10-CM | POA: Diagnosis not present

## 2022-06-05 DIAGNOSIS — L578 Other skin changes due to chronic exposure to nonionizing radiation: Secondary | ICD-10-CM | POA: Diagnosis not present

## 2022-06-05 DIAGNOSIS — Z85828 Personal history of other malignant neoplasm of skin: Secondary | ICD-10-CM | POA: Diagnosis not present

## 2022-06-05 DIAGNOSIS — L570 Actinic keratosis: Secondary | ICD-10-CM | POA: Diagnosis not present

## 2022-06-05 DIAGNOSIS — L821 Other seborrheic keratosis: Secondary | ICD-10-CM | POA: Diagnosis not present

## 2022-06-05 DIAGNOSIS — L298 Other pruritus: Secondary | ICD-10-CM | POA: Diagnosis not present

## 2022-06-05 DIAGNOSIS — Z859 Personal history of malignant neoplasm, unspecified: Secondary | ICD-10-CM | POA: Diagnosis not present

## 2022-06-05 DIAGNOSIS — Z872 Personal history of diseases of the skin and subcutaneous tissue: Secondary | ICD-10-CM | POA: Diagnosis not present

## 2022-06-27 DIAGNOSIS — E039 Hypothyroidism, unspecified: Secondary | ICD-10-CM | POA: Diagnosis not present

## 2022-06-27 DIAGNOSIS — G20A1 Parkinson's disease without dyskinesia, without mention of fluctuations: Secondary | ICD-10-CM | POA: Diagnosis not present

## 2022-06-27 DIAGNOSIS — M47816 Spondylosis without myelopathy or radiculopathy, lumbar region: Secondary | ICD-10-CM | POA: Diagnosis not present

## 2022-06-27 DIAGNOSIS — E119 Type 2 diabetes mellitus without complications: Secondary | ICD-10-CM | POA: Diagnosis not present

## 2022-06-29 ENCOUNTER — Other Ambulatory Visit: Payer: Self-pay | Admitting: Infectious Diseases

## 2022-06-29 ENCOUNTER — Other Ambulatory Visit: Payer: Self-pay

## 2022-06-29 DIAGNOSIS — M5416 Radiculopathy, lumbar region: Secondary | ICD-10-CM

## 2022-07-04 ENCOUNTER — Ambulatory Visit
Admission: RE | Admit: 2022-07-04 | Discharge: 2022-07-04 | Disposition: A | Discharge status: Home / Self Care | Payer: Medicare HMO | Source: Ambulatory Visit | Attending: Infectious Diseases | Admitting: Infectious Diseases

## 2022-07-04 DIAGNOSIS — M5416 Radiculopathy, lumbar region: Secondary | ICD-10-CM | POA: Insufficient documentation

## 2022-07-04 DIAGNOSIS — M4317 Spondylolisthesis, lumbosacral region: Secondary | ICD-10-CM | POA: Diagnosis not present

## 2022-07-04 DIAGNOSIS — M4316 Spondylolisthesis, lumbar region: Secondary | ICD-10-CM | POA: Diagnosis not present

## 2022-07-04 DIAGNOSIS — M545 Low back pain, unspecified: Secondary | ICD-10-CM | POA: Diagnosis not present

## 2022-07-16 DIAGNOSIS — M48062 Spinal stenosis, lumbar region with neurogenic claudication: Secondary | ICD-10-CM | POA: Diagnosis not present

## 2022-07-16 DIAGNOSIS — R739 Hyperglycemia, unspecified: Secondary | ICD-10-CM | POA: Diagnosis not present

## 2022-07-16 DIAGNOSIS — M5416 Radiculopathy, lumbar region: Secondary | ICD-10-CM | POA: Diagnosis not present

## 2022-07-16 DIAGNOSIS — M5136 Other intervertebral disc degeneration, lumbar region: Secondary | ICD-10-CM | POA: Diagnosis not present

## 2022-07-18 ENCOUNTER — Other Ambulatory Visit: Payer: Medicare HMO

## 2022-07-18 ENCOUNTER — Ambulatory Visit: Payer: Medicare HMO | Admitting: Internal Medicine

## 2022-07-27 DIAGNOSIS — M48062 Spinal stenosis, lumbar region with neurogenic claudication: Secondary | ICD-10-CM | POA: Diagnosis not present

## 2022-07-27 DIAGNOSIS — M5416 Radiculopathy, lumbar region: Secondary | ICD-10-CM | POA: Diagnosis not present

## 2022-07-27 DIAGNOSIS — R739 Hyperglycemia, unspecified: Secondary | ICD-10-CM | POA: Diagnosis not present

## 2022-08-02 DIAGNOSIS — E119 Type 2 diabetes mellitus without complications: Secondary | ICD-10-CM | POA: Diagnosis not present

## 2022-08-06 ENCOUNTER — Other Ambulatory Visit: Payer: Self-pay | Admitting: Infectious Diseases

## 2022-08-06 DIAGNOSIS — Z1231 Encounter for screening mammogram for malignant neoplasm of breast: Secondary | ICD-10-CM

## 2022-08-22 DIAGNOSIS — M5136 Other intervertebral disc degeneration, lumbar region: Secondary | ICD-10-CM | POA: Diagnosis not present

## 2022-08-22 DIAGNOSIS — R739 Hyperglycemia, unspecified: Secondary | ICD-10-CM | POA: Diagnosis not present

## 2022-08-22 DIAGNOSIS — M5416 Radiculopathy, lumbar region: Secondary | ICD-10-CM | POA: Diagnosis not present

## 2022-08-22 DIAGNOSIS — M48062 Spinal stenosis, lumbar region with neurogenic claudication: Secondary | ICD-10-CM | POA: Diagnosis not present

## 2022-09-10 ENCOUNTER — Ambulatory Visit
Admission: RE | Admit: 2022-09-10 | Discharge: 2022-09-10 | Disposition: A | Discharge status: Home / Self Care | Payer: Medicare HMO | Source: Ambulatory Visit | Attending: Infectious Diseases | Admitting: Infectious Diseases

## 2022-09-10 DIAGNOSIS — Z1231 Encounter for screening mammogram for malignant neoplasm of breast: Secondary | ICD-10-CM | POA: Diagnosis not present

## 2022-09-14 DIAGNOSIS — Z853 Personal history of malignant neoplasm of breast: Secondary | ICD-10-CM | POA: Diagnosis not present

## 2022-09-14 DIAGNOSIS — R410 Disorientation, unspecified: Secondary | ICD-10-CM | POA: Diagnosis not present

## 2022-09-14 DIAGNOSIS — E039 Hypothyroidism, unspecified: Secondary | ICD-10-CM | POA: Diagnosis not present

## 2022-09-14 DIAGNOSIS — E119 Type 2 diabetes mellitus without complications: Secondary | ICD-10-CM | POA: Diagnosis not present

## 2022-09-14 DIAGNOSIS — G20A1 Parkinson's disease without dyskinesia, without mention of fluctuations: Secondary | ICD-10-CM | POA: Diagnosis not present

## 2022-09-14 DIAGNOSIS — M47816 Spondylosis without myelopathy or radiculopathy, lumbar region: Secondary | ICD-10-CM | POA: Diagnosis not present

## 2022-09-25 DIAGNOSIS — E049 Nontoxic goiter, unspecified: Secondary | ICD-10-CM | POA: Diagnosis not present

## 2022-09-25 DIAGNOSIS — E119 Type 2 diabetes mellitus without complications: Secondary | ICD-10-CM | POA: Diagnosis not present

## 2022-09-25 DIAGNOSIS — E039 Hypothyroidism, unspecified: Secondary | ICD-10-CM | POA: Diagnosis not present

## 2022-09-26 DIAGNOSIS — M48062 Spinal stenosis, lumbar region with neurogenic claudication: Secondary | ICD-10-CM | POA: Diagnosis not present

## 2022-09-26 DIAGNOSIS — M5416 Radiculopathy, lumbar region: Secondary | ICD-10-CM | POA: Diagnosis not present

## 2022-10-03 ENCOUNTER — Encounter: Payer: Self-pay | Admitting: Dietician

## 2022-10-03 ENCOUNTER — Encounter: Payer: Medicare HMO | Attending: Infectious Diseases | Admitting: Dietician

## 2022-10-03 DIAGNOSIS — Z713 Dietary counseling and surveillance: Secondary | ICD-10-CM | POA: Insufficient documentation

## 2022-10-03 DIAGNOSIS — Z7984 Long term (current) use of oral hypoglycemic drugs: Secondary | ICD-10-CM | POA: Diagnosis not present

## 2022-10-03 DIAGNOSIS — E119 Type 2 diabetes mellitus without complications: Secondary | ICD-10-CM | POA: Insufficient documentation

## 2022-10-03 NOTE — Progress Notes (Signed)
Diabetes Self-Management Education  Visit Type: First/Initial  Appt. Start Time: 0945 Appt. End Time: 1115  10/03/2022  Ms. Rebecca Patterson, identified by name and date of birth, is a 88 y.o. female with a diagnosis of Diabetes: Type 2.   ASSESSMENT  There were no vitals taken for this visit. There is no height or weight on file to calculate BMI.  Pt two daughters, Cleda Clarks and D.A. Levada Schilling, present for appointment. Daughter reports pt did 2 courses of steroids related to spinal stenosis April before the jump in A1c (7.8%, 03/26/2022 - 9.0%, 06/27/2022), A1c previously fairly stable between high 6's to low 7's. Pt taking metformin for their DM once daily after dinner, no GI side effects. Daughter reports weight loss of ~10 pounds in the last 2-3 months since making dietary changes, states they do not want pt to lose any more weight.  Daughter reports checking pts FBG daily, FBG values range ~130 - 150, as high as 180 morning after the steroid shot. Pt reports liking to drink chocolate milk, eating fruits, strawberry crunch ice cream at night.   Diabetes Self-Management Education - 10/03/22 1325       Visit Information   Visit Type First/Initial      Initial Visit   Diabetes Type Type 2    Date Diagnosed 2016    Are you currently following a meal plan? No    Are you taking your medications as prescribed? Yes      Health Coping   How would you rate your overall health? Good      Psychosocial Assessment   Patient Belief/Attitude about Diabetes Motivated to manage diabetes    What is the hardest part about your diabetes right now, causing you the most concern, or is the most worrisome to you about your diabetes?   Making healty food and beverage choices;Getting support / problem solving    Self-care barriers Other (comment)   Advanced Age   Self-management support Doctor's office;Family    Other persons present Patient;Family Member   2 daughters   Patient Concerns Glycemic  Control;Nutrition/Meal planning;Weight Control    Special Needs Verbal instruction;Other (comment)   Hearing impaired   Learning Readiness Change in progress    How often do you need to have someone help you when you read instructions, pamphlets, or other written materials from your doctor or pharmacy? 5 - Always    What is the last grade level you completed in school? 12th      Pre-Education Assessment   Patient understands the diabetes disease and treatment process. Needs Instruction    Patient understands incorporating nutritional management into lifestyle. Needs Instruction    Patient undertands incorporating physical activity into lifestyle. Needs Instruction    Patient understands using medications safely. Demonstrates understanding / competency   daughters take care of medications   Patient understands monitoring blood glucose, interpreting and using results Comprehends key points   Daughter tests FBG   Patient understands prevention, detection, and treatment of acute complications. Needs Instruction    Patient understands prevention, detection, and treatment of chronic complications. Needs Instruction    Patient understands how to develop strategies to address psychosocial issues. Needs Instruction    Patient understands how to develop strategies to promote health/change behavior. Needs Instruction      Complications   Last HgB A1C per patient/outside source 7.1 %    How often do you check your blood sugar? 1-2 times/day    Fasting Blood glucose range (mg/dL) 284-132  Have you had a dilated eye exam in the past 12 months? Yes    Have you had a dental exam in the past 12 months? No    Are you checking your feet? No      Activity / Exercise   Activity / Exercise Type ADL's    How many days per week do you exercise? 0    How many minutes per day do you exercise? 0    Total minutes per week of exercise 0      Patient Education   Previous Diabetes Education No    Disease  Pathophysiology Factors that contribute to the development of diabetes    Healthy Eating Role of diet in the treatment of diabetes and the relationship between the three main macronutrients and blood glucose level;Carbohydrate counting;Food label reading, portion sizes and measuring food.;Meal options for control of blood glucose level and chronic complications.    Medications Reviewed patients medication for diabetes, action, purpose, timing of dose and side effects.    Monitoring Identified appropriate SMBG and/or A1C goals.    Chronic complications Relationship between chronic complications and blood glucose control    Lifestyle and Health Coping Lifestyle issues that need to be addressed for better diabetes care      Individualized Goals (developed by patient)   Nutrition Follow meal plan discussed    Physical Activity Not Applicable    Medications take my medication as prescribed    Monitoring  Test my blood glucose as discussed    Problem Solving Eating Pattern    Reducing Risk examine blood glucose patterns      Post-Education Assessment   Patient understands the diabetes disease and treatment process. Comprehends key points    Patient understands incorporating nutritional management into lifestyle. Comprehends key points    Patient undertands incorporating physical activity into lifestyle. N/A    Patient understands using medications safely. Demonstrates understanding / competency    Patient understands monitoring blood glucose, interpreting and using results Comprehends key points    Patient understands prevention, detection, and treatment of acute complications. Comprehends key points    Patient understands prevention, detection, and treatment of chronic complications. Comprehends key points    Patient understands how to develop strategies to address psychosocial issues. Comprehends key points    Patient understands how to develop strategies to promote health/change behavior.  Comprehends key points      Outcomes   Expected Outcomes Demonstrated interest in learning. Expect positive outcomes    Future DMSE 2 months    Program Status Not Completed             Individualized Plan for Diabetes Self-Management Training:   Learning Objective:  Patient will have a greater understanding of diabetes self-management. Patient education plan is to attend individual and/or group sessions per assessed needs and concerns.   Plan:   Patient Instructions  Choose [employer] brand milk for your cereal and chocolate milk.  Have a 4-6 oz serving of chocolate milk no more than once a day, and a cup of 2% milk for a snack!  When having a snack, try to pair 1-2 servings of protein (7-14g) with no more than 1 serving of carbs (15g). You can have snacks that are only protein if you like (milk, greek yogurt, cheese, lean deli meat)   Work towards eating three meals a day, about 5-6 hours apart!  Begin to recognize carbohydrates, proteins, and non-starchy vegetables in your food choices!  Have 2 carb choices at  each meal (30 g).   Begin to build your meals using the proportions of the Balanced Plate. First, select your carb choice(s) for the meal, and determine how much you should have to equal 2 carb choices (30 g).Make this 25% of your meal. Next, select your source of protein to pair with your carb choice(s). Make this another 25% of your meal. Have 3 oz. Of protein (20g) Finally, complete your meal with a variety of non-starchy vegetables. Make this the remaining 50% of your meal.  Keep up the great work! Continue to drink 2 - 3 [employer] of water each day!  Expected Outcomes:  Demonstrated interest in learning. Expect positive outcomes  Education material provided: My Plate, Snack sheet, and Carbohydrate counting sheet  If problems or questions, patient to contact team via:  Phone and Email  Future DSME appointment: 2 months

## 2022-10-03 NOTE — Patient Instructions (Addendum)
Choose [employer]life brand milk for your cereal and chocolate milk.  Have a 4-6 oz serving of chocolate milk no more than once a day, and a cup of 2% milk for a snack!  When having a snack, try to pair 1-2 servings of protein (7-14g) with no more than 1 serving of carbs (15g). You can have snacks that are only protein if you like (milk, greek yogurt, cheese, lean deli meat)   Work towards eating three meals a day, about 5-6 hours apart!  Begin to recognize carbohydrates, proteins, and non-starchy vegetables in your food choices!  Have 2 carb choices at each meal (30 g).   Begin to build your meals using the proportions of the Balanced Plate. First, select your carb choice(s) for the meal, and determine how much you should have to equal 2 carb choices (30 g).Make this 25% of your meal. Next, select your source of protein to pair with your carb choice(s). Make this another 25% of your meal. Have 3 oz. Of protein (20g) Finally, complete your meal with a variety of non-starchy vegetables. Make this the remaining 50% of your meal.  Keep up the great work! Continue to drink 2 - 3 [employer] of water each day!

## 2022-10-12 DIAGNOSIS — Z0000 Encounter for general adult medical examination without abnormal findings: Secondary | ICD-10-CM | POA: Diagnosis not present

## 2022-10-12 DIAGNOSIS — E039 Hypothyroidism, unspecified: Secondary | ICD-10-CM | POA: Diagnosis not present

## 2022-10-12 DIAGNOSIS — G20A1 Parkinson's disease without dyskinesia, without mention of fluctuations: Secondary | ICD-10-CM | POA: Diagnosis not present

## 2022-10-12 DIAGNOSIS — Z1331 Encounter for screening for depression: Secondary | ICD-10-CM | POA: Diagnosis not present

## 2022-10-12 DIAGNOSIS — E785 Hyperlipidemia, unspecified: Secondary | ICD-10-CM | POA: Diagnosis not present

## 2022-10-12 DIAGNOSIS — E119 Type 2 diabetes mellitus without complications: Secondary | ICD-10-CM | POA: Diagnosis not present

## 2022-10-12 DIAGNOSIS — M47816 Spondylosis without myelopathy or radiculopathy, lumbar region: Secondary | ICD-10-CM | POA: Diagnosis not present

## 2022-10-17 DIAGNOSIS — M48062 Spinal stenosis, lumbar region with neurogenic claudication: Secondary | ICD-10-CM | POA: Diagnosis not present

## 2022-10-17 DIAGNOSIS — M5136 Other intervertebral disc degeneration, lumbar region: Secondary | ICD-10-CM | POA: Diagnosis not present

## 2022-10-17 DIAGNOSIS — M5416 Radiculopathy, lumbar region: Secondary | ICD-10-CM | POA: Diagnosis not present

## 2022-11-05 DIAGNOSIS — W19XXXA Unspecified fall, initial encounter: Secondary | ICD-10-CM | POA: Diagnosis not present

## 2022-11-05 DIAGNOSIS — Y92009 Unspecified place in unspecified non-institutional (private) residence as the place of occurrence of the external cause: Secondary | ICD-10-CM | POA: Diagnosis not present

## 2022-11-05 DIAGNOSIS — G20A1 Parkinson's disease without dyskinesia, without mention of fluctuations: Secondary | ICD-10-CM | POA: Diagnosis not present

## 2022-11-05 DIAGNOSIS — R4189 Other symptoms and signs involving cognitive functions and awareness: Secondary | ICD-10-CM | POA: Diagnosis not present

## 2022-11-05 DIAGNOSIS — M25552 Pain in left hip: Secondary | ICD-10-CM | POA: Diagnosis not present

## 2022-11-05 DIAGNOSIS — M79652 Pain in left thigh: Secondary | ICD-10-CM | POA: Diagnosis not present

## 2022-12-03 DIAGNOSIS — Z0100 Encounter for examination of eyes and vision without abnormal findings: Secondary | ICD-10-CM | POA: Diagnosis not present

## 2022-12-03 DIAGNOSIS — H04123 Dry eye syndrome of bilateral lacrimal glands: Secondary | ICD-10-CM | POA: Diagnosis not present

## 2022-12-03 DIAGNOSIS — H353133 Nonexudative age-related macular degeneration, bilateral, advanced atrophic without subfoveal involvement: Secondary | ICD-10-CM | POA: Diagnosis not present

## 2022-12-04 ENCOUNTER — Encounter: Payer: Medicare HMO | Attending: Infectious Diseases | Admitting: Dietician

## 2022-12-04 ENCOUNTER — Encounter: Payer: Self-pay | Admitting: Dietician

## 2022-12-04 VITALS — Ht <= 58 in | Wt 121.0 lb

## 2022-12-04 DIAGNOSIS — E119 Type 2 diabetes mellitus without complications: Secondary | ICD-10-CM | POA: Diagnosis not present

## 2022-12-04 NOTE — Patient Instructions (Addendum)
If you notice yourself feeling increased/severe thirst please check your blood sugar. If it it over 200, please inform your doctor.  Eat whatever you would like!! Try to keep your weight between 120 - 125 lbs!.  Switch from [employer] to [employer] for some extra nutrition support.  Have spoonful of peanut butter for a snack a couple times a day. Pair it with apples or on some crackers.

## 2022-12-04 NOTE — Progress Notes (Signed)
Diabetes Self-Management Education  Visit Type: Follow-up  Appt. Start Time: 1000 Appt. End Time: 1040  12/04/2022  Rebecca Patterson, identified by name and date of birth, is a 88 y.o. female with a diagnosis of Diabetes:  .   ASSESSMENT  Height 4\' 10"  (1.473 m), weight 121 lb (54.9 kg). Body mass index is 25.29 kg/m.  Pt daughter, Rebecca Patterson. Levada Schilling, present for appointment. Pt reports A1c is back down to 7.1% (09/25/2022), will have it rechecked in December, no medication changes. Pt states back pain is present but minor/manageable, walks around the house and to the mailbox daily for activity. Pt daughter reports checking FBG on M,W,F, brought log to visit, values stabilizing between 120-130 Daughter reports weight has stalled at 121-122 lbs, pt has not been able to gain weight back. Pt reports good appetite, trying to eat as much as they can.   Diabetes Self-Management Education - 12/04/22 1047       Visit Information   Visit Type Follow-up      Psychosocial Assessment   Other persons present Family Member;Patient    Patient Concerns Weight Control   Wants to gain weight     Pre-Education Assessment   Patient understands the diabetes disease and treatment process. Comprehends key points    Patient understands incorporating nutritional management into lifestyle. Comprehends key points    Patient undertands incorporating physical activity into lifestyle. Comprehends key points    Patient understands using medications safely. Demonstrates understanding / competency    Patient understands monitoring blood glucose, interpreting and using results Demonstrates understanding / competency   Daughter does finger stick for pt   Patient understands prevention, detection, and treatment of acute complications. Comprehends key points    Patient understands prevention, detection, and treatment of chronic complications. Compreheands key points    Patient understands how to develop strategies to  address psychosocial issues. Comprehends key points    Patient understands how to develop strategies to promote health/change behavior. Comprehends key points      Complications   Last HgB A1C per patient/outside source 7.1 %   09/25/2022   How often do you check your blood sugar? 3-4 times / week    Fasting Blood glucose range (mg/dL) 32-440;102-725      Dietary Intake   Breakfast Greek Yogurt    Lunch [place] Yogurt    Dinner [employer], carrots, potatoes, onions    Beverage(s) water, milk, chocolate milk      Activity / Exercise   Activity / Exercise Type ADL's    How many days per week do you exercise? 0    How many minutes per day do you exercise? 0    Total minutes per week of exercise 0      Patient Education   Monitoring Identified appropriate SMBG and/or A1C goals.    Acute complications Discussed and identified patients' prevention, symptoms, and treatment of hyperglycemia.      Individualized Goals (developed by patient)   Nutrition Follow meal plan discussed;General guidelines for healthy choices and portions discussed    Medications take my medication as prescribed    Monitoring  Test my blood glucose as discussed    Reducing Risk examine blood glucose patterns    Health Coping Not Applicable      Patient Self-Evaluation of Goals - Patient rates self as meeting previously set goals (% of time)   Nutrition >75% (most of the time)    Physical Activity < 25% (hardly ever/never)    Medications >  75% (most of the time)    Monitoring >75% (most of the time)    Reducing Risk (treating acute and chronic complications) >75% (most of the time)    Health Coping >75% (most of the time)      Post-Education Assessment   Patient understands the diabetes disease and treatment process. Demonstrates understanding / competency    Patient understands incorporating nutritional management into lifestyle. Demonstrates understanding / competency    Patient undertands incorporating physical  activity into lifestyle. N/A   Limited due to advanced age, osteoporosis   Patient understands using medications safely. Demonstrates understanding / competency    Patient understands monitoring blood glucose, interpreting and using results Demonstrates understanding / competency    Patient understands prevention, detection, and treatment of acute complications. Demonstrates understanding / competency    Patient understands prevention, detection, and treatment of chronic complications. Demonstrates understanding / competency    Patient understands how to develop strategies to address psychosocial issues. Demonstrates understanding / competency    Patient understands how to develop strategies to promote health/change behavior. Demonstrates understanding / competency      Outcomes   Expected Outcomes Demonstrated interest in learning. Expect positive outcomes    Future DMSE PRN    Program Status Completed      Subsequent Visit   Since your last visit have you continued or begun to take your medications as prescribed? Yes    Since your last visit have you had your blood pressure checked? No    Since your last visit have you experienced any weight changes? No change    Since your last visit, are you checking your blood glucose at least once a day? No   M,W,F            Individualized Plan for Diabetes Self-Management Training:   Learning Objective:  Patient will have a greater understanding of diabetes self-management. Patient education plan is to attend individual and/or group sessions per assessed needs and concerns.   Plan:   Patient Instructions  If you notice yourself feeling increased/severe thirst please check your blood sugar. If it it over 200, please inform your doctor.  Eat whatever you would like!! Try to keep your weight between 120 - 125 lbs!.  Switch from [employer]es[employer] to [employer] for some extra nutrition support.  Have spoonful of peanut butter for a  snack a couple times a day. Pair it with apples or on some crackers.  Expected Outcomes:  Demonstrated interest in learning. Expect positive outcomes  If problems or questions, patient to contact team via:  Phone and Email  Future DSME appointment: PRN

## 2022-12-14 DIAGNOSIS — Z0100 Encounter for examination of eyes and vision without abnormal findings: Secondary | ICD-10-CM | POA: Diagnosis not present

## 2023-01-01 DIAGNOSIS — E039 Hypothyroidism, unspecified: Secondary | ICD-10-CM | POA: Diagnosis not present

## 2023-01-01 DIAGNOSIS — E119 Type 2 diabetes mellitus without complications: Secondary | ICD-10-CM | POA: Diagnosis not present

## 2023-01-01 DIAGNOSIS — G20A1 Parkinson's disease without dyskinesia, without mention of fluctuations: Secondary | ICD-10-CM | POA: Diagnosis not present

## 2023-01-01 DIAGNOSIS — R221 Localized swelling, mass and lump, neck: Secondary | ICD-10-CM | POA: Diagnosis not present

## 2023-01-02 ENCOUNTER — Other Ambulatory Visit: Payer: Self-pay | Admitting: Infectious Diseases

## 2023-01-02 DIAGNOSIS — R221 Localized swelling, mass and lump, neck: Secondary | ICD-10-CM

## 2023-01-08 DIAGNOSIS — E049 Nontoxic goiter, unspecified: Secondary | ICD-10-CM | POA: Diagnosis not present

## 2023-01-08 DIAGNOSIS — M810 Age-related osteoporosis without current pathological fracture: Secondary | ICD-10-CM | POA: Diagnosis not present

## 2023-01-08 DIAGNOSIS — G7000 Myasthenia gravis without (acute) exacerbation: Secondary | ICD-10-CM | POA: Diagnosis not present

## 2023-01-08 DIAGNOSIS — E039 Hypothyroidism, unspecified: Secondary | ICD-10-CM | POA: Diagnosis not present

## 2023-01-08 DIAGNOSIS — E782 Mixed hyperlipidemia: Secondary | ICD-10-CM | POA: Diagnosis not present

## 2023-01-08 DIAGNOSIS — E119 Type 2 diabetes mellitus without complications: Secondary | ICD-10-CM | POA: Diagnosis not present

## 2023-01-08 DIAGNOSIS — M545 Low back pain, unspecified: Secondary | ICD-10-CM | POA: Diagnosis not present

## 2023-01-08 DIAGNOSIS — G20A1 Parkinson's disease without dyskinesia, without mention of fluctuations: Secondary | ICD-10-CM | POA: Diagnosis not present

## 2023-01-08 DIAGNOSIS — G8929 Other chronic pain: Secondary | ICD-10-CM | POA: Diagnosis not present

## 2023-01-11 ENCOUNTER — Ambulatory Visit
Admission: RE | Admit: 2023-01-11 | Discharge: 2023-01-11 | Disposition: A | Discharge status: Home / Self Care | Payer: Medicare HMO | Source: Ambulatory Visit | Attending: Infectious Diseases | Admitting: Infectious Diseases

## 2023-01-11 DIAGNOSIS — R221 Localized swelling, mass and lump, neck: Secondary | ICD-10-CM | POA: Insufficient documentation

## 2023-01-11 DIAGNOSIS — Z471 Aftercare following joint replacement surgery: Secondary | ICD-10-CM | POA: Diagnosis not present

## 2023-01-11 MED ORDER — IOHEXOL 300 MG/ML  SOLN
60.0000 mL | Freq: Once | INTRAMUSCULAR | Status: AC | PRN
  Administered 2023-01-11: 60 mL via INTRAVENOUS

## 2023-01-15 DIAGNOSIS — E039 Hypothyroidism, unspecified: Secondary | ICD-10-CM | POA: Diagnosis not present

## 2023-01-15 DIAGNOSIS — G20A1 Parkinson's disease without dyskinesia, without mention of fluctuations: Secondary | ICD-10-CM | POA: Diagnosis not present

## 2023-01-15 DIAGNOSIS — E119 Type 2 diabetes mellitus without complications: Secondary | ICD-10-CM | POA: Diagnosis not present

## 2023-01-15 DIAGNOSIS — M47896 Other spondylosis, lumbar region: Secondary | ICD-10-CM | POA: Diagnosis not present

## 2023-01-15 DIAGNOSIS — Z853 Personal history of malignant neoplasm of breast: Secondary | ICD-10-CM | POA: Diagnosis not present

## 2023-07-30 ENCOUNTER — Other Ambulatory Visit: Payer: Self-pay | Admitting: Infectious Diseases

## 2023-07-30 DIAGNOSIS — Z1231 Encounter for screening mammogram for malignant neoplasm of breast: Secondary | ICD-10-CM

## 2023-09-11 ENCOUNTER — Ambulatory Visit
Admission: RE | Admit: 2023-09-11 | Discharge: 2023-09-11 | Disposition: A | Discharge status: Home / Self Care | Source: Ambulatory Visit | Attending: Infectious Diseases | Admitting: Infectious Diseases

## 2023-09-11 DIAGNOSIS — Z1231 Encounter for screening mammogram for malignant neoplasm of breast: Secondary | ICD-10-CM | POA: Insufficient documentation

## 2024-02-13 ENCOUNTER — Ambulatory Visit: Attending: Neurology | Admitting: Speech Pathology

## 2024-02-13 DIAGNOSIS — R41841 Cognitive communication deficit: Secondary | ICD-10-CM | POA: Insufficient documentation

## 2024-02-13 NOTE — Therapy (Addendum)
 " OUTPATIENT SPEECH LANGUAGE PATHOLOGY  COGNITION EVALUATION   Patient Name: Rebecca Patterson MRN: [id number] DOB:[birth year], 89 y.o., female Today's Date: 02/13/2024  PCP: Alm Needle, MD REFERRING PROVIDER: Jannett Fairly, MD   End of Session - 02/13/24 1826     Visit Number 1    Number of Visits 13    Date for Recertification  03/26/24    Authorization Type [employer]    Progress Note Due on Visit 10    SLP Start Time 1315    SLP Stop Time  1400    SLP Time Calculation (min) 45 min    Activity Tolerance Patient tolerated treatment well          Past Medical History:  Diagnosis Date   Allergy    Breast cancer, right (HCC) 2013   Lumpectomy with Mammosite radiation and Tamoxifen   Complication of anesthesia    Corn    Family history of adverse reaction to anesthesia    sister - slow to wake   Fibrocystic breast    GERD (gastroesophageal reflux disease)    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   Hyperlipidemia    Osteoporosis    Osteoporosis    Personal history of radiation therapy    PONV (postoperative nausea and vomiting)    Thyroid  disease    Vertigo    long ago   Wears dentures    full upper and lower   Wears hearing aid    bilateral   Past Surgical History:  Procedure Laterality Date   ABDOMINAL HYSTERECTOMY     APPENDECTOMY     BREAST BIOPSY Right 2013   Stereo- Positive   BREAST CYST EXCISION Right n/a   cyst removed   BREAST LUMPECTOMY Right    2013   BUNIONECTOMY Bilateral 2004   CAROTID BODY TUMOR EXCISION Right 10/19/2005   CATARACT EXTRACTION W/PHACO Right 11/10/2014   Procedure: CATARACT EXTRACTION PHACO AND INTRAOCULAR LENS PLACEMENT (IOC);  Surgeon: Dene Etienne, MD;  Location: [facility];  Service: Ophthalmology;  Laterality: Right;   CHOLECYSTECTOMY     ESOPHAGOGASTRODUODENOSCOPY (EGD) WITH PROPOFOL  N/A 04/23/2018   Procedure: ESOPHAGOGASTRODUODENOSCOPY (EGD) WITH PROPOFOL ;  Surgeon: Janalyn Keene NOVAK, MD;  Location: [facility];  Service: Endoscopy;  Laterality: N/A;  pt prefers later in am   KNEE SURGERY Right 2005   Mastoid surgery Right    SKIN CANCER EXCISION  03/17/2014   right side of face Dr. 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   Cardiac conduction disorder 06/17/2014   T2DM (type 2 diabetes mellitus) (HCC) 06/17/2014   Hyperlipidemia associated with type 2 diabetes mellitus (HCC) 06/17/2014   Adult hypothyroidism 06/17/2014   Osteoporosis 06/17/2014   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   Avitaminosis D 06/17/2014    ONSET DATE: Parkinson's Disease Diagnosis (2019); date of referral 01/24/2024  REFERRING DIAG: G20.A1 (ICD-10-CM) - Parkinson's disease (HCC)   THERAPY DIAG:  Cognitive communication deficit  Rationale for Evaluation and Treatment Rehabilitation  SUBJECTIVE:   SUBJECTIVE STATEMENT: Pt pleasant, intermittently emotional Pt accompanied by: family member - her daughters accompanied her  PERTINENT HISTORY  and DIAGNOSTIC FINDINGS: Per chart, Ms Winther is a 89 year old female with medical history of Parkinson's Disease (2019) with right hemibody predominance, tremors, difficulty swallowing (resolved with medication increase in past), associated motor symptoms and associated dementia with hallucinations and delusions. Pt currently on rivastigmine and Seroquel.   Additional medical history includes restless leg, prior CVA, hypothyroidism, hyperlipidemia, prediabetes, breast cancer, osteoporosis, double vision.   PAIN:  Are you having pain? No   FALLS: Has patient fallen in last  6 months?  No  LIVING ENVIRONMENT: Lives with: her daughters alternate staying with pt Lives in: House/apartment  PLOF:  Level of assistance: Needed assistance with ADLs, Needed assistance with IADLS Employment: Retired   PATIENT GOALS   ways to help mama  OBJECTIVE:   COGNITIVE COMMUNICATION Overall cognitive status: Impaired Areas of impairment:  Oriented to person Oriented to situation Attention: Impaired: Sustained Memory: Impaired: Immediate Working Short term Long term Designer, [employer]ellery Awareness: Impaired: Intellectual, Emergent, and Anticipatory Executive function: Impaired: Initiation, Impulse control, Problem solving, Organization, Planning, Error awareness, and Slow processing Impaired Functional Impairments: Per chart as well as pt/daughters' report, pt with progressive cognitive decline, currently requires 24 hour supervision with her daughters alternating responsibility. Pt is able to participate in household tasks, is dependent for medication and money management. She is experiencing more frequent daily visual hallucinations (see clinical impressions statement for details).  Pt and her daughters do not report any overt s/s of dysphagia or aspiration, no unintended weight loss. Pt presents with good speech intelligibility, benefits from intermittent repetition of information d/t hearing loss (was wearing hearing aids during evaluation). Tearful throughout when discussing hallucinations.    AUDITORY COMPREHENSION  Overall auditory comprehension: Appears intact YES/NO questions: Appears intact for basic Following directions: Appears intact for basic Conversation: Simple Interfering components: attention, awareness, visual impairments, hearing, working memory, and visual hallucinations   EXPRESSION: verbal  VERBAL EXPRESSION:   Overall verbal expression: Appears intact Level of generative/spontaneous verbalization: sentence Automatic  speech: name: intact and social response: intact  Repetition: Appears intact Pragmatics: intact during this evaluation; however is impaired in the presence of visual hallucinations Non-verbal means of communication: N/A  ORAL MOTOR EXAMINATION Facial : WFL Lingual: WFL Velum: WFL Mandible: WFL Cough: WFL Voice: WFL  MOTOR SPEECH: Overall motor speech: Appears intact Phonation: normal Resonance: WFL Articulation: Appears intact Intelligibility: Intelligible Motor planning: Appears intact   TODAY'S TREATMENT:  Skilled education provided on potential triggers and strategies to reduce visual hallucinations. Pt's daughters are already employing these methods   PATIENT EDUCATION: Education details: neurodegenerative process within Parkinson's Disease progression Person educated: Patient and Child(ren) Education method: Explanation Education comprehension: verbalized understanding and needs further education   HOME EXERCISE PROGRAM:   Look up Karlyne Orange (website provided)   GOALS:  Goals reviewed with patient? Yes  SHORT TERM GOALS: Target date: 10 sessions  Within 10 sessions, the patient will use a standardized orientation checklist (date, place, current activity) to improve reality orientation during therapy tasks in 9/10 opportunities with maximal verbal cues. Baseline:unable Goal status: INITIAL  2.  With maximal cues, the patient will use a pre-taught script (e.g., I'm experiencing something unusual; please help me stay safe) during moments of uncertainty in role-play scenarios with 50% accuracy. Baseline: new Goal status: INITIAL  3.  Over 5 weeks, the patient and caregivers will document occurrences of visual misperceptions or hallucinations in a daily log with 90% accuracy to support pattern recognition and reporting to the care team. Baseline:  new Goal status: INITIAL   LONG TERM GOALS: Target date: 03/26/2024  Patient and caregiver will demonstrate  improved use of communication scripts for reporting hallucinations to healthcare providers, with 100% accuracy. Baseline: new Goal status: INITIAL  2.  The patient will work with their caregiver to implement and maintain a routine (consistent lighting, reduced clutter) and report reduced frequency of misperceptions in a weekly log. Baseline: NEW Goal status: INITIAL  ASSESSMENT:  CLINICAL IMPRESSION: Patient is a 89 y.o. female who was seen today for at the request of her daughters for help with navigating the later stages of Parkinson's Dementia specifically visual hallucinations.   Pt and her daughters are hoping for assistance in managing visual hallucinations. Per their report the hallucinations impact every aspect of every part of her life.   Hallucinations impact pt's ability to perform ADLs as she reports having to wait at the bathroom door because they wouldn't let me into the bathroom.   When attending church pt reports there was a coffin at the front of the church, it opened and my husband was coming out. I don't understand why they would put a coffin in the front of the church but they have for the last 3 weeks.   When going to a restaurant about 35 people come in with her, take over the restaurant, eat off of other people's plates and she feels embarrassed because they come in with her. In response to this example, pt becomes tearful and states I don't know why they choose me, they are driving me crazy. They get on my nerves. They are everywhere. I went into the dinning and there she sat completely naked (hallucination). Why would she sit naked in my house? Pt further describes these people (visual hallucinations) follow me everywhere, there about 35 of them, even when driving around, I see them and think they are going to cause us  to have wreck. Pt reports seeing their cars driving in front of my house over and over.    SLP worked with pt and her daughters' to  potentially identify any triggers, time of day, environment and/or sleep disturbances. None were identified.   Pt currently has an appointment with her neurologist this afternoon. Recommend asking neurologist about any potential medication changes that might be helpful.   Skilled ST services are recommended to support cognitive-communication skills, coping strategies, orientation, and environment-bsed approaches in an effort to help reduce the impact of hallucinations on safety, communication and quality of life.   Goals will target ways to help patient and her caregiver rocgnize, track and document hallucinations as well as strategies to maintain awareness of reality.   OBJECTIVE IMPAIRMENTS include attention, memory, awareness, and executive functioning. These impairments are limiting patient from ADLs/IADLs. Factors affecting potential to achieve goals and functional outcome are ability to learn/carryover information, co-morbidities, medical prognosis, and severity of impairments. Patient will benefit from skilled SLP services to address above impairments and improve overall function.  REHAB POTENTIAL: Good  PLAN: SLP FREQUENCY: 1-2x/week  SLP DURATION: 6 weeks  PLANNED INTERVENTIONS: SLP instruction and feedback, Compensatory strategies, and Patient/family education   Shaquelle Hernon B. Rubbie, M.S., CCC-SLP, CBIS Speech-Language Pathologist Certified Brain Injury Specialist [facility]  [facility] [phone] Ascom [phone] Fax [phone]   "

## 2024-02-19 ENCOUNTER — Ambulatory Visit: Admitting: Speech Pathology

## 2024-02-19 DIAGNOSIS — R41841 Cognitive communication deficit: Secondary | ICD-10-CM | POA: Diagnosis not present

## 2024-02-19 NOTE — Therapy (Signed)
 " OUTPATIENT SPEECH LANGUAGE PATHOLOGY  COGNITION TREATMENT NOTE   Patient Name: Rebecca Patterson MRN: [id number] DOB:1934-02-22, 89 y.o., female Today's Date: 02/19/2024  PCP: Alm Needle, MD REFERRING PROVIDER: Jannett Fairly, MD   End of Session - 02/19/24 1316     Visit Number 2    Number of Visits 13    Date for Recertification  03/26/24    Authorization Type [employer]    Progress Note Due on Visit 10    SLP Start Time 1315    SLP Stop Time  1400    SLP Time Calculation (min) 45 min    Activity Tolerance Patient tolerated treatment well          Past Medical History:  Diagnosis Date   Allergy    Breast cancer, right (HCC) 2013   Lumpectomy with Mammosite radiation and Tamoxifen   Complication of anesthesia    Corn    Family history of adverse reaction to anesthesia    sister - slow to wake   Fibrocystic breast    GERD (gastroesophageal reflux disease)    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   Hyperlipidemia    Osteoporosis    Osteoporosis    Personal history of radiation therapy    PONV (postoperative nausea and vomiting)    Thyroid  disease    Vertigo    long ago   Wears dentures    full upper and lower   Wears hearing aid    bilateral   Past Surgical History:  Procedure Laterality Date   ABDOMINAL HYSTERECTOMY     APPENDECTOMY     BREAST BIOPSY Right 2013   Stereo- Positive   BREAST CYST EXCISION Right n/a   cyst removed   BREAST LUMPECTOMY Right    2013   BUNIONECTOMY Bilateral 2004   CAROTID BODY TUMOR EXCISION Right 10/19/2005   CATARACT EXTRACTION W/PHACO Right 11/10/2014   Procedure: CATARACT EXTRACTION PHACO AND INTRAOCULAR LENS PLACEMENT (IOC);  Surgeon: Dene Etienne, MD;  Location: [facility];  Service: Ophthalmology;  Laterality: Right;   CHOLECYSTECTOMY     ESOPHAGOGASTRODUODENOSCOPY (EGD) WITH PROPOFOL  N/A 04/23/2018   Procedure: ESOPHAGOGASTRODUODENOSCOPY (EGD) WITH PROPOFOL ;  Surgeon: Janalyn Keene NOVAK, MD;  Location: [facility];  Service: Endoscopy;  Laterality: N/A;  pt prefers later in am   KNEE SURGERY Right 2005   Mastoid surgery Right    SKIN CANCER EXCISION  03/17/2014   right side of face Dr. 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   Cardiac conduction disorder 06/17/2014   T2DM (type 2 diabetes mellitus) (HCC) 06/17/2014   Hyperlipidemia associated with type 2 diabetes mellitus (HCC) 06/17/2014   Adult hypothyroidism 06/17/2014   Osteoporosis 06/17/2014   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   Avitaminosis D 06/17/2014    ONSET DATE: Parkinson's Disease Diagnosis (2019); date of referral 01/24/2024  REFERRING DIAG: G20.A1 (ICD-10-CM) - Parkinson's disease (HCC)   THERAPY DIAG:  Cognitive communication deficit  Rationale for Evaluation and Treatment Rehabilitation  SUBJECTIVE:   PERTINENT HISTORY and DIAGNOSTIC FINDINGS: Per chart, [name] is a 89 year old female with medical history  of Parkinson's Disease (2019) with right hemibody predominance, tremors, difficulty swallowing (resolved with medication increase in past), associated motor symptoms and associated dementia with hallucinations and delusions. Pt currently on rivastigmine and Seroquel.   Additional medical history includes restless leg, prior CVA, hypothyroidism, hyperlipidemia, prediabetes, breast cancer, osteoporosis, double vision.   PAIN:  Are you having pain? No   FALLS: Has patient fallen in last 6 months?  No  LIVING ENVIRONMENT: Lives with: her daughters alternate staying with pt Lives in:  House/apartment  PLOF:  Level of assistance: Needed assistance with ADLs, Needed assistance with IADLS Employment: Retired   PATIENT GOALS   ways to help mama  SUBJECTIVE STATEMENT: Pt pleasant  Pt accompanied by: family member - her daughters accompanied her   OBJECTIVE:   TODAY'S TREATMENT:  Skilled treatment session focused on educating pt and her daughters on strategies to help with visual hallucinations such as ways to facilitate re-orientation to present, location, use of rote statement to promote pt sense of safety. This information was provided verbally as well as was written. Additional education provided on forward safety planning such as alarms on doors and scanning environment for any potential safety risks such as chemicals or knifes as pt voices some paranoia inspired by content of her visual hallucinations.   This written sent email to pt's neurology NP inquiring about any further services this writer might be able to provide. Pt and her family are agreeable to putting pt on hold pending cessation of medication and follow up with neurology in 6 weeks.    PATIENT EDUCATION: Education details: neurodegenerative process within Parkinson's Disease progression Person educated: Patient and Child(ren) Education method: Explanation Education comprehension: verbalized understanding and needs further education   HOME EXERCISE PROGRAM:   Look up [employer] (website provided)   GOALS:  Goals reviewed with patient? Yes  SHORT TERM GOALS: Target date: 10 sessions  Within 10 sessions, the patient will use a standardized orientation checklist (date, place, current activity) to improve reality orientation during therapy tasks in 9/10 opportunities with maximal verbal cues. Baseline:unable Goal status: INITIAL  2.  With maximal cues, the patient will use a pre-taught script (e.g., I'm experiencing something unusual; please help me stay safe) during moments of uncertainty  in role-play scenarios with 50% accuracy. Baseline: new Goal status: INITIAL  3.  Over 5 weeks, the patient and caregivers will document occurrences of visual misperceptions or hallucinations in a daily log with 90% accuracy to support pattern recognition and reporting to the care team. Baseline: new Goal status: INITIAL   LONG TERM GOALS: Target date: 03/26/2024  Patient and caregiver will demonstrate improved use of communication scripts for reporting hallucinations to healthcare providers, with 100% accuracy. Baseline: new Goal status: INITIAL  2.  The patient will work with their caregiver to implement and maintain a routine (consistent lighting, reduced clutter) and report reduced frequency of misperceptions in a weekly log. Baseline: NEW Goal status: INITIAL  ASSESSMENT:  CLINICAL IMPRESSION: Patient is a 89 y.o. female who was seen today for at the request of her daughters for help with navigating the later stages of Parkinson's Dementia specifically visual hallucinations.   Pt and her daughters are hoping for assistance in managing visual hallucinations. Per their report the hallucinations impact every aspect of every part of her life.   Hallucinations impact pt's ability to perform ADLs as she reports having to wait at the bathroom door because they wouldn't let me into the bathroom.   When attending church  pt reports there was a coffin at the front of the church, it opened and my husband was coming out. I don't understand why they would put a coffin in the front of the church but they have for the last 3 weeks.   When going to a restaurant about 35 people come in with her, take over the restaurant, eat off of other people's plates and she feels embarrassed because they come in with her. In response to this example, pt becomes tearful and states I don't know why they choose me, they are driving me crazy. They get on my nerves. They are everywhere. I went into the  dinning and there she sat completely naked (hallucination). Why would she sit naked in my house? Pt further describes these people (visual hallucinations) follow me everywhere, there about 35 of them, even when driving around, I see them and think they are going to cause us  to have wreck. Pt reports seeing their cars driving in front of my house over and over.    SLP worked with pt and her daughters' to potentially identify any triggers, time of day, environment and/or sleep disturbances. None were identified.   Pt currently has an appointment with her neurologist this afternoon. Recommend asking neurologist about any potential medication changes that might be helpful.   Skilled ST services are recommended to support cognitive-communication skills, coping strategies, orientation, and environment-bsed approaches in an effort to help reduce the impact of hallucinations on safety, communication and quality of life.   Goals will target ways to help patient and her caregiver rocgnize, track and document hallucinations as well as strategies to maintain awareness of reality.   OBJECTIVE IMPAIRMENTS include attention, memory, awareness, and executive functioning. These impairments are limiting patient from ADLs/IADLs. Factors affecting potential to achieve goals and functional outcome are ability to learn/carryover information, co-morbidities, medical prognosis, and severity of impairments. Patient will benefit from skilled SLP services to address above impairments and improve overall function.  REHAB POTENTIAL: Good  PLAN: SLP FREQUENCY: 1-2x/week  SLP DURATION: 6 weeks  PLANNED INTERVENTIONS: SLP instruction and feedback, Compensatory strategies, and Patient/family education   Ashritha Desrosiers B. Rubbie, M.S., CCC-SLP, CBIS Speech-Language Pathologist Certified Brain Injury Specialist [facility]  [facility] [phone] Ascom [phone] Fax  [phone]   "

## 2024-02-24 ENCOUNTER — Ambulatory Visit: Admitting: Speech Pathology

## 2024-02-28 ENCOUNTER — Ambulatory Visit: Admitting: Speech Pathology

## 2024-03-02 ENCOUNTER — Ambulatory Visit: Admitting: Speech Pathology

## 2024-03-04 ENCOUNTER — Ambulatory Visit: Admitting: Speech Pathology

## 2024-03-09 ENCOUNTER — Ambulatory Visit: Admitting: Speech Pathology

## 2024-03-11 ENCOUNTER — Ambulatory Visit: Admitting: Speech Pathology

## 2024-03-16 ENCOUNTER — Ambulatory Visit: Admitting: Speech Pathology

## 2024-03-19 ENCOUNTER — Ambulatory Visit: Admitting: Speech Pathology

## 2024-03-23 ENCOUNTER — Ambulatory Visit: Admitting: Speech Pathology

## 2024-03-25 ENCOUNTER — Ambulatory Visit: Admitting: Speech Pathology

## 2024-03-30 ENCOUNTER — Ambulatory Visit: Admitting: Speech Pathology

## 2024-04-01 ENCOUNTER — Ambulatory Visit: Admitting: Speech Pathology

## 2024-04-06 ENCOUNTER — Ambulatory Visit: Admitting: Speech Pathology

## 2024-04-08 ENCOUNTER — Ambulatory Visit: Admitting: Speech Pathology

## 2024-04-13 ENCOUNTER — Ambulatory Visit: Admitting: Diagnostic Neuroimaging

## 2024-04-13 ENCOUNTER — Ambulatory Visit: Admitting: Speech Pathology

## 2024-04-15 ENCOUNTER — Ambulatory Visit: Admitting: Speech Pathology
